# Patient Record
Sex: Female | Born: 1955 | Race: Black or African American | Hispanic: No | Marital: Married | Smoking: Never smoker
Health system: Southern US, Community
[De-identification: ages and names within clinical notes are randomized; demographics above are authoritative.]

## PROBLEM LIST (undated history)

## (undated) DIAGNOSIS — R519 Headache, unspecified: Secondary | ICD-10-CM

## (undated) DIAGNOSIS — I10 Essential (primary) hypertension: Secondary | ICD-10-CM

## (undated) DIAGNOSIS — E785 Hyperlipidemia, unspecified: Secondary | ICD-10-CM

## (undated) DIAGNOSIS — E119 Type 2 diabetes mellitus without complications: Secondary | ICD-10-CM

## (undated) DIAGNOSIS — E559 Vitamin D deficiency, unspecified: Secondary | ICD-10-CM

## (undated) DIAGNOSIS — M1990 Unspecified osteoarthritis, unspecified site: Secondary | ICD-10-CM

## (undated) DIAGNOSIS — Z8611 Personal history of tuberculosis: Secondary | ICD-10-CM

## (undated) DIAGNOSIS — T7840XA Allergy, unspecified, initial encounter: Secondary | ICD-10-CM

## (undated) DIAGNOSIS — I2510 Atherosclerotic heart disease of native coronary artery without angina pectoris: Secondary | ICD-10-CM

## (undated) HISTORY — PX: TOTAL ABDOMINAL HYSTERECTOMY: SHX209

## (undated) HISTORY — DX: Unspecified osteoarthritis, unspecified site: M19.90

## (undated) HISTORY — DX: Vitamin D deficiency, unspecified: E55.9

## (undated) HISTORY — DX: Allergy, unspecified, initial encounter: T78.40XA

## (undated) HISTORY — DX: Hyperlipidemia, unspecified: E78.5

## (undated) HISTORY — DX: Type 2 diabetes mellitus without complications: E11.9

## (undated) HISTORY — DX: Headache, unspecified: R51.9

## (undated) HISTORY — DX: Atherosclerotic heart disease of native coronary artery without angina pectoris: I25.10

## (undated) HISTORY — PX: ABDOMINAL HYSTERECTOMY: SHX81

## (undated) HISTORY — DX: Personal history of tuberculosis: Z86.11

## (undated) HISTORY — PX: POLYPECTOMY: SHX149

---

## 1997-12-14 ENCOUNTER — Emergency Department (HOSPITAL_COMMUNITY): Admission: EM | Admit: 1997-12-14 | Discharge: 1997-12-14 | Payer: Self-pay

## 1999-03-12 ENCOUNTER — Other Ambulatory Visit: Admission: RE | Admit: 1999-03-12 | Discharge: 1999-03-12 | Payer: Self-pay | Admitting: Internal Medicine

## 1999-04-02 ENCOUNTER — Ambulatory Visit (HOSPITAL_COMMUNITY): Admission: RE | Admit: 1999-04-02 | Discharge: 1999-04-02 | Payer: Self-pay | Admitting: Internal Medicine

## 1999-04-02 ENCOUNTER — Encounter: Payer: Self-pay | Admitting: Internal Medicine

## 2002-12-20 ENCOUNTER — Other Ambulatory Visit: Admission: RE | Admit: 2002-12-20 | Discharge: 2002-12-20 | Payer: Self-pay | Admitting: Obstetrics and Gynecology

## 2004-07-30 ENCOUNTER — Ambulatory Visit: Payer: Self-pay | Admitting: Gastroenterology

## 2004-08-11 ENCOUNTER — Ambulatory Visit: Payer: Self-pay | Admitting: Gastroenterology

## 2004-12-14 ENCOUNTER — Inpatient Hospital Stay (HOSPITAL_COMMUNITY): Admission: RE | Admit: 2004-12-14 | Discharge: 2004-12-16 | Payer: Self-pay | Admitting: Obstetrics & Gynecology

## 2004-12-14 ENCOUNTER — Encounter (INDEPENDENT_AMBULATORY_CARE_PROVIDER_SITE_OTHER): Payer: Self-pay | Admitting: *Deleted

## 2009-03-29 ENCOUNTER — Emergency Department (HOSPITAL_COMMUNITY): Admission: EM | Admit: 2009-03-29 | Discharge: 2009-03-29 | Payer: Self-pay | Admitting: Family Medicine

## 2009-04-03 ENCOUNTER — Ambulatory Visit (HOSPITAL_COMMUNITY): Admission: RE | Admit: 2009-04-03 | Discharge: 2009-04-03 | Payer: Self-pay | Admitting: Internal Medicine

## 2009-04-07 ENCOUNTER — Encounter: Admission: RE | Admit: 2009-04-07 | Discharge: 2009-04-07 | Payer: Self-pay | Admitting: Obstetrics and Gynecology

## 2009-04-09 ENCOUNTER — Encounter: Admission: RE | Admit: 2009-04-09 | Discharge: 2009-04-09 | Payer: Self-pay | Admitting: Obstetrics and Gynecology

## 2010-05-17 ENCOUNTER — Encounter
Admission: RE | Admit: 2010-05-17 | Discharge: 2010-05-17 | Payer: Self-pay | Source: Home / Self Care | Attending: Internal Medicine | Admitting: Internal Medicine

## 2010-09-28 DIAGNOSIS — I10 Essential (primary) hypertension: Secondary | ICD-10-CM

## 2010-10-22 NOTE — Op Note (Signed)
NAMEALVIN, RUBANO                   ACCOUNT NO.:  [id number]   MEDICAL RECORD NO.:  [id number]          PATIENT TYPE:  INP   LOCATION:  9303                          FACILITY:  [facility]   PHYSICIAN:  Ilda Mori, M.D.   DATE OF BIRTH:  1955-07-28   DATE OF PROCEDURE:  12/14/2004  DATE OF DISCHARGE:                                 OPERATIVE REPORT   PREOPERATIVE DIAGNOSIS:  Myoma uteri.   POSTOPERATIVE DIAGNOSIS:  Myoma uteri.  Plus appendix with suspected  fecalith.   PROCEDURE:  Total abdominal hysterectomy, bilateral salpingo-oophorectomy,  and appendectomy.   SURGEON:  Ilda Mori, M.D.   ASSISTANT:  Luvenia Redden, M.D.   ANESTHESIA:  General endotracheal.   ESTIMATED BLOOD LOSS:  100 mL.   SPECIMENS:  Uterus, myoma, right and left adnexa and appendix.   COMPLICATIONS:  None.   FINDINGS:  The pelvis was free of adhesions.  There was a 6 cm pedunculated  myoma coming off the uterine fundus and curving down into the cul-de-sac.  The uterus had small 1 cm pedunculated myoma as well.  The right and left  tube and ovary appeared normal status post previous tubal ligation.  The  kidneys were palpable bilaterally.  The liver edge was smooth.  On palpation  of the appendix, there were felt to be fecaliths present in the appendix.   INDICATIONS FOR PROCEDURE:  This is a 55 year old gravida 2, para 2, who was  noted to have a pelvic mass.  Evaluation revealed what appeared to be a  pedunculated myoma approximately 8 cm.  In addition, other uterine myoma  were noted on ultrasound and CT examination.  A CA125 was normal.  The  findings were discussed with the patient and the decision was made to  proceed with total abdominal hysterectomy for diagnosis and treatment of the  pelvic mass.  Because of the patient's age, a prophylactic oophorectomy will  be performed at the time of surgery.   DESCRIPTION OF PROCEDURE:  The patient was taken to the operating room and  placed in a  supine position where general endotracheal anesthesia was  induced.  The abdomen and vagina were prepped and draped in the usual  sterile fashion.  The bladder was catheterized.  A low transverse incision  was made and carried down to the fascia which was extended transversely.  The rectus sheath was then dissected from the underlying rectus muscle  inferiorly and superiorly.  The midline was then identified.  The posterior  sheath was incised vertically.  The peritoneum was then entered sharply and  extended vertically.  A self-retaining retractor, O'Connor-O'Sullivan  retractor was then placed.  The bowel was packed with lap pads.  The  pedunculated myoma was then identified, grasped, and delivered through the  incision.  The base of this was cauterized to free the myoma to aid in the  surgery.  The myoma was sent off as a separate specimen.  The cornea of the  uterus was then grasped with Kelly clamps and elevated.  The round ligament  was cauterized and divided.  The serosa over the lower uterine segment was  dissected free and the bladder was displaced inferiorly away from the lower  uterine segment and cervix.  The posterior peritoneum was then opened and  the infundibulopelvic ligaments were isolated bilaterally, clamped with  Heaney clamps, cut, and doubly ligated.  The uterine arteries were clamped,  cut, and tied.  The base of the broad ligament and cardinal ligaments were  clamped, cut, and tied.  The vagina was entered sharply and the cervix was  dissected free from the vaginal cuff and the specimen was freed and given to  the nurse to be sent to pathology.  The angles were closed with figure-of-  eight suture and the cuff was closed with a figure-of-eight suture.  Hemostasis was noted to be present.  On palpation of the appendix, it was  felt that a fecalith was present and she was at increased risk for  appendicitis.  For that reason, decision was made to remove the appendix.   The mesoappendix was clamped, cut, and ligated.  The base of the appendix  was clamped.  The appendix was then cut and removed from the table.  The  appendiceal stump was then doubly ligated.  Hemostasis was noted to be  present.  At this point, the lap pads were removed.  The self-retaining  retractor was removed.  The peritoneum was closed with a running 0 Vicryl  suture.  The fascia was closed with a running 0 Vicryl suture and the skin  was closed with a 4-0 subcuticular suture.  The patient tolerated the  procedure well and left the operating room in good condition.       RK/MEDQ  D:  12/14/2004  T:  12/14/2004  Job:  [id number]   cc:   Luvenia Redden, M.D.  [address]  [place]  [place] [address]  Fax: [phone]

## 2010-10-22 NOTE — H&P (Signed)
Penny Rice, Penny Rice                   ACCOUNT NO.:  [id number]   MEDICAL RECORD NO.:  [id number]          PATIENT TYPE:  INP   LOCATION:  NA                            [facility]  [facility]   PHYSICIAN:  Ilda Mori, M.D.   DATE OF BIRTH:  Oct 07, 1955   DATE OF ADMISSION:  12/14/2004  DATE OF DISCHARGE:                                HISTORY & PHYSICAL   CHIEF COMPLAINT:  Myoma uteri.   HISTORY OF PRESENT ILLNESS:  This is a 55 year old, gravida 2, para 2 female  who was found to have a large pelvic mass on CT scan of the abdomen.  Evaluation with ultrasound reveals that the mass appears to be a  pedunculated myoma.  The patient had a colonoscopy in March of 2006, which  was entirely negative.  The CA125 was within normal limits.  The findings  were discussed with the patient, and the decision was made to proceed with  an abdominal hysterectomy to confirm and remove what is suspected to be a  large pedunculated myoma.  In addition, the patient requests prophylactic  bilateral salpingo-oophorectomy.   PAST MEDICAL HISTORY:  Hypertension.  She is being treated for that by Dr.  Nolene Bernheim.   PREVIOUS SURGERIES:  Tubal ligation.   ALLERGIES:  No known drug allergies.   SOCIAL HISTORY:  She has been married for 23 years.  She has 2 children.  Works in Engineering geologist.  She does not smoke or use alcohol.   FAMILY HISTORY:  Positive for hypertension.  Otherwise, it appears to be  negative.   MEDICATIONS:  1.  Hydrochlorothiazide 50 mg daily.  2.  Potassium chloride 10 mEq per day.  3.  Norvasc 10 mg per day.  4.  Labetalol 200 mg twice daily.   PHYSICAL EXAMINATION:  VITAL SIGNS:  Afebrile.  Her vital signs are stable.  Blood pressure is normal at 104/78.  Her weight is 173 pounds.  HEENT:  Ears, nose, and throat are normal.  LUNGS:  Clear to auscultation and percussion.  HEART:  Without murmurs.  ABDOMEN:  Soft without hepatosplenomegaly.  EXTREMITIES:  Benign.  PELVIC:  Normal external  genitalia.  Cervix appears normal.  Her uterus is  irregular with a large palpable mass in the cul-de-sac.   ADMITTING DIAGNOSIS:  Uterine myoma.   SECONDARY DIAGNOSIS:  Well-controlled hypertension.   HOSPITAL PLAN:  Proceed with abdominal hysterectomy and bilateral salpingo-  oophorectomy.       RK/MEDQ  D:  12/13/2004  T:  12/13/2004  Job:  [id number]

## 2010-10-22 NOTE — Discharge Summary (Signed)
Penny Rice, Penny Rice                   ACCOUNT NO.:  [id number]   MEDICAL RECORD NO.:  [id number]          PATIENT TYPE:  INP   LOCATION:  9303                          [facility]   PHYSICIAN:  Ilda Mori, M.D.   DATE OF BIRTH:  Jan 25, 1956   DATE OF ADMISSION:  12/14/2004  DATE OF DISCHARGE:  12/16/2004                                 DISCHARGE SUMMARY   FINAL DIAGNOSES:  Myomata uteri.   SECONDARY DIAGNOSES:  Hypertension.   PROCEDURES:  1.  Total abdominal hysterectomy.  2.  Bilateral salpingo-oophorectomy.  3.  Appendectomy.   COMPLICATIONS:  None.   CONDITION ON DISCHARGE:  Improved.   This is a 55 year old gravida 2, para 2 female who was found to have a large  pelvic mass on CT scan of the abdomen.  Evaluation reveals this most likely  represented a pedunculated myoma and decision was made to proceed with  surgical exploration and removal of the uterus and ovaries.  The patient was  brought to the operating room the day of admission where a total abdominal  hysterectomy, bilateral salpingo-oophorectomy, and appendectomy was  performed.  The patient's postoperative course was benign without  significant fever or anemia.  On the second postoperative day the patient  was ambulating well.  Her pain was controlled with oral analgesia.  She was  passing flatus and voiding without difficulty.  She is therefore felt to be  ready for discharge.  She was discharged on a regular diet, told to limit  her activities.  She was given Tylox 20 tablets to take one to two every  four hours for pain.  She was also encouraged to try over-the-counter pain  medications prior to using the Tylox.  She will resume her preoperative  antihypertensive medication which included hydrochlorothiazide, Norvasc, and  labetalol.  In addition, during her hospitalization a mild hyperglycemia was  noted with a fasting blood glucose of 122 and a random blood glucose of 134.  The patient was informed of  these values and asked to follow up with her  internist.  She will return to our office in four weeks for follow-up  evaluation.   LABORATORY DATA:  As I mentioned, fasting glucose was 122.  Her other  laboratory data showed that preoperatively her random glucose of 134.  Her  renal profile revealed a slightly low chloride of 93 mEq/L.  Her liver  function tests were normal.  Urinalysis was negative.  Her hemoglobin  preoperatively was 12.6 with a white count of  8700.  Postoperatively her hemoglobin was 11.4 with a white count of 12,000.  Pathology revealed appendix with fecal material within the lumen.  Her  uterus showed myoma.  Total weight was around 250 g.  Her ovaries were  without pathology.       RK/MEDQ  D:  12/16/2004  T:  12/16/2004  Job:  [id number]

## 2012-08-03 ENCOUNTER — Encounter: Payer: Self-pay | Admitting: Gastroenterology

## 2012-10-19 ENCOUNTER — Other Ambulatory Visit: Payer: Self-pay | Admitting: Internal Medicine

## 2012-10-19 DIAGNOSIS — Z1231 Encounter for screening mammogram for malignant neoplasm of breast: Secondary | ICD-10-CM

## 2012-10-24 ENCOUNTER — Ambulatory Visit (HOSPITAL_COMMUNITY)
Admission: RE | Admit: 2012-10-24 | Discharge: 2012-10-24 | Disposition: A | Discharge status: Home / Self Care | Payer: BC Managed Care – PPO | Source: Ambulatory Visit | Attending: Internal Medicine | Admitting: Internal Medicine

## 2012-10-24 DIAGNOSIS — Z1231 Encounter for screening mammogram for malignant neoplasm of breast: Secondary | ICD-10-CM

## 2013-08-25 ENCOUNTER — Encounter (HOSPITAL_COMMUNITY): Payer: Self-pay | Admitting: Emergency Medicine

## 2013-08-25 ENCOUNTER — Emergency Department (HOSPITAL_COMMUNITY)
Admission: EM | Admit: 2013-08-25 | Discharge: 2013-08-25 | Disposition: A | Discharge status: Home / Self Care | Payer: BC Managed Care – PPO | Attending: Emergency Medicine | Admitting: Emergency Medicine

## 2013-08-25 ENCOUNTER — Emergency Department (HOSPITAL_COMMUNITY): Payer: BC Managed Care – PPO

## 2013-08-25 DIAGNOSIS — I10 Essential (primary) hypertension: Secondary | ICD-10-CM | POA: Insufficient documentation

## 2013-08-25 DIAGNOSIS — R630 Anorexia: Secondary | ICD-10-CM | POA: Insufficient documentation

## 2013-08-25 DIAGNOSIS — J40 Bronchitis, not specified as acute or chronic: Secondary | ICD-10-CM | POA: Insufficient documentation

## 2013-08-25 DIAGNOSIS — B9789 Other viral agents as the cause of diseases classified elsewhere: Secondary | ICD-10-CM | POA: Insufficient documentation

## 2013-08-25 DIAGNOSIS — R42 Dizziness and giddiness: Secondary | ICD-10-CM | POA: Insufficient documentation

## 2013-08-25 DIAGNOSIS — Z79899 Other long term (current) drug therapy: Secondary | ICD-10-CM | POA: Insufficient documentation

## 2013-08-25 DIAGNOSIS — J3489 Other specified disorders of nose and nasal sinuses: Secondary | ICD-10-CM | POA: Insufficient documentation

## 2013-08-25 DIAGNOSIS — R6883 Chills (without fever): Secondary | ICD-10-CM | POA: Insufficient documentation

## 2013-08-25 DIAGNOSIS — B349 Viral infection, unspecified: Secondary | ICD-10-CM

## 2013-08-25 HISTORY — DX: Essential (primary) hypertension: I10

## 2013-08-25 MED ORDER — LEVOFLOXACIN 750 MG PO TABS: 750.0000 mg | ORAL_TABLET | Freq: Every day | ORAL | Status: DC

## 2013-08-25 MED ORDER — AEROCHAMBER Z-STAT PLUS/MEDIUM MISC
1.0000 | Freq: Once | Status: AC
  Administered 2013-08-25: 1

## 2013-08-25 MED ORDER — ALBUTEROL SULFATE HFA 108 (90 BASE) MCG/ACT IN AERS
2.0000 | INHALATION_SPRAY | Freq: Once | RESPIRATORY_TRACT | Status: AC
  Administered 2013-08-25: 2 via RESPIRATORY_TRACT
  Filled 2013-08-25: qty 6.7

## 2013-08-25 MED ORDER — PREDNISONE 20 MG PO TABS: ORAL_TABLET | ORAL | Status: DC

## 2013-08-25 NOTE — ED Provider Notes (Signed)
CSN: [id number]     Arrival date & time 08/25/13  1034 History  This chart was scribed for non-physician practitioner, Jamse Mead, PA-C,working with Alfonzo Feller, DO, by Marlowe Kays, ED Scribe.  This patient was seen in room WTR5/WTR5 and the patient's care was started at 12:15 PM.  Chief Complaint  Patient presents with  . Cough  . Wheezing  . Back Pain    pain due to coughing   The history is provided by the patient. No language interpreter was used.   HPI Comments:  Penny Rice is a 58 y.o. female who presents to the Emergency Department complaining of nasal congestion and productive cough of white mucus onset three weeks. She reports associated chills, dizziness, and decreased appetite. Pt states she presented to her PCP approximately two weeks ago and received a prescription for an antibiotic but cannot recall which one. She presented to her PCP again one week ago and received a prescription for [facility], but states it made her cough more. She denies any sick contacts. She denies fever, hemoptysis, diaphoresis, CP, nausea, vomiting, hematochezia, urinary issues, sinus pressure, sore throat, neck pain, difficulty swallowing, blurred or loss of vision. She states she is not a smoker.   Past Medical History  Diagnosis Date  . Hypertension    Past Surgical History  Procedure Laterality Date  . Abdominal hysterectomy     Family History  Problem Relation Age of Onset  . Hypertension Sister    History  Substance Use Topics  . Smoking status: Never Smoker   . Smokeless tobacco: Not on file  . Alcohol Use: No   OB History   Grav Para Term Preterm Abortions TAB SAB Ect Mult Living                 Review of Systems  Constitutional: Positive for appetite change. Negative for fever.  HENT: Positive for congestion. Negative for sore throat and trouble swallowing.   Respiratory: Positive for cough.   Cardiovascular: Negative for chest pain.  Gastrointestinal:  Negative for nausea and vomiting.  Musculoskeletal: Negative for neck pain.  All other systems reviewed and are negative.   Allergies  Review of patient's allergies indicates no known allergies.  Home Medications   Current Outpatient Rx  Name  Route  Sig  Dispense  Refill  . amLODipine (NORVASC) 10 MG tablet   Oral   Take 10 mg by mouth daily.           Marland Kitchen labetalol (NORMODYNE) 200 MG tablet   Oral   Take 200 mg by mouth 2 (two) times daily.           Marland Kitchen levofloxacin (LEVAQUIN) 750 MG tablet   Oral   Take 1 tablet (750 mg total) by mouth daily. X 7 days   5 tablet   0   . predniSONE (DELTASONE) 20 MG tablet      3 tabs po day one, then 2 tabs daily x 4 days   11 tablet   0    Triage Vitals: BP 117/84  Pulse 87  Temp(Src) 98 F (36.7 C) (Oral)  Wt 190 lb (86.183 kg)  SpO2 97% Physical Exam  Nursing note and vitals reviewed. Constitutional: She is oriented to person, place, and time. She appears well-developed and well-nourished.  HENT:  Head: Normocephalic and atraumatic.  Mouth/Throat: Oropharynx is clear and moist. No oropharyngeal exudate.  Eyes: Conjunctivae and EOM are normal. Pupils are equal, round, and reactive to light.  Right eye exhibits no discharge. Left eye exhibits no discharge.  Neck: Normal range of motion. Neck supple. No tracheal deviation present.  Cardiovascular: Normal rate, regular rhythm and normal heart sounds.  Exam reveals no friction rub.   No murmur heard. Pulses:      Radial pulses are 2+ on the right side, and 2+ on the left side.       Dorsalis pedis pulses are 2+ on the right side, and 2+ on the left side.  Pulmonary/Chest: Effort normal and breath sounds normal. No respiratory distress. She has no wheezes. She has no rales. She exhibits no tenderness.  Patient is able to speak in full sentences without difficulty Negative use of accessory muscles Negative stridor  Musculoskeletal: Normal range of motion.  Full ROM to upper and  lower extremities without difficulty noted, negative ataxia noted.  Lymphadenopathy:    She has no cervical adenopathy.  Neurological: She is alert and oriented to person, place, and time. No cranial nerve deficit. She exhibits normal muscle tone. Coordination normal.  Cranial nerves III-XII grossly intact Strength 5+/5+ to upper and lower extremities bilaterally with resistance applied, equal distribution noted Equal grip strength   Skin: Skin is warm and dry.  Psychiatric: She has a normal mood and affect. Her behavior is normal.    ED Course  Procedures (including critical care time) DIAGNOSTIC STUDIES: Oxygen Saturation is 97% on RA, normal by my interpretation.   COORDINATION OF CARE: 12:20 PM- Will order a CXR. Pt verbalizes understanding and agrees to plan.  Medications  albuterol (PROVENTIL HFA;VENTOLIN HFA) 108 (90 BASE) MCG/ACT inhaler 2 puff (not administered)    Labs Review Labs Reviewed - No data to display Imaging Review Dg Chest 2 View  08/25/2013   CLINICAL DATA:  Wheezing, cough, history hypertension  EXAM: CHEST  2 VIEW  COMPARISON:  None  FINDINGS: Normal heart size, mediastinal contours, and pulmonary vascularity.  Mild peribronchial thickening.  Area of minimal opacity in the right upper lobe could represent subtle infiltrate but followup until resolution recommended to exclude underlying abnormality/nodule.  Remaining lungs clear.  No pleural effusion or pneumothorax.  Bones appear demineralized.  IMPRESSION: Bronchitic changes with minimal right upper lobe opacity, could represent subtle infiltrate though followup radiographs until resolution recommended to exclude underlying pulmonary nodule.   Electronically Signed   By: Lavonia Dana M.D.   On: 08/25/2013 12:25     EKG Interpretation None      MDM   Final diagnoses:  Bronchitis  Viral syndrome    Filed Vitals:   08/25/13 1133 08/25/13 1240  BP: 117/84   Pulse: 87   Temp: 98 F (36.7 C)    TempSrc: Oral   Weight: 190 lb (86.183 kg)   SpO2: 97% 95%    I personally performed the services described in this documentation, which was scribed in my presence. The recorded information has been reviewed and is accurate.  Patient presenting to the ED with cough, nasal congestion, chest congestion that has been ongoing for the past 2 weeks. Patient was seen and assessed by her primary care provider on Wednesday to prescribe the patient Tessalon Perles-reported that this is not helping. Stated that she's been staying hydrated with minimal relief. Stated that she's been having abdominal soreness and lower back discomfort secondary to coughing so much. Denied hemoptysis, fever, chills, chest pain, difficulty breathing, nausea, vomiting. Alert and oriented. GCS 15. Heart rate and rhythm normal. Lungs clear to auscultation to upper and lower lobes  bilaterally. Radial and DP pulses 2+ bilaterally. Full range of motion to upper and lower extremities bilaterally without difficulty or ataxia noted. Patient is able to speak in full sentences without difficulty. Negative use of accessory muscles. Negative signs of respiratory distress. Chest x-ray noted bronchitis findings with minimal right upper lobe opacity that could represent subtle infiltrate though followup radiographs until resolution are recommended to exclude possible pulmonary nodule. Pulse ox with ambulation has been checked with pulse ox at 95% on room air. Suspicion to be bronchitis-viral infection. Cannot rule out possible beginnings of pneumonia. Cannot rule out possible beginnings of pulmonary nodule. Patient stable, afebrile. Patient not hypoxic-95% with ambulation on room air and 97% on room air while resting. Patient does not appear septic. Discharged patient. Discharge patient with antibiotics, burst of prednisone, albuterol inhaler. Discussed with patient to rest and stay hydrated. Discussed with patient to avoid any physical or shortness  activity. Discussed with patient to stay hydrated. Discussed with patient to closely monitor symptoms and if symptoms are to worsen or change to report back to the ED - strict return instructions given.  Patient agreed to plan of care, understood, all questions answered.   Jamse Mead, PA-C 08/25/13 2132

## 2013-08-25 NOTE — ED Notes (Addendum)
Pt reports 3 week hx of chest congestion and cough. "Cough caused back and stomach pain ". PCP prescribed Tessalon Perls. Pt feels the this medicine made her cough more. Denies fever over the last few day, denies chest pain ort shortness of breath. Pt c/o sweating

## 2013-08-25 NOTE — Discharge Instructions (Signed)
Please call your doctor for a followup appointment within 24-48 hours. When you talk to your doctor please let them know that you were seen in the emergency department and have them acquire all of your records so that they can discuss the findings with you and formulate a treatment plan to fully care for your new and ongoing problems. Please call and set up an appointment with your primary care provider to be reassessed within the next 24-48 hours Will need a repeat chest x-ray after antibiotics have been completed Please rest and stay hydrated Please take antibiotics as prescribed Please use albuterol inhaler as needed for shortness of breath Please drink plenty of fluids Please continue to monitor symptoms closely and if symptoms are to worsen or change (fever greater than 101, chills, neck pain, neck stiffness, worsening changes to cough, blood in her sputum of your cough, nausea, vomiting, worsening or changes to abdominal pain, dizziness, inability to walk, chest pain, shortness of breath, difficulty breathing) please report back to the ED immediately  Bronchitis Bronchitis is inflammation of the airways that extend from the windpipe into the lungs (bronchi). The inflammation often causes mucus to develop, which leads to a cough. If the inflammation becomes severe, it may cause shortness of breath. CAUSES  Bronchitis may be caused by:   Viral infections.   Bacteria.   Cigarette smoke.   Allergens, pollutants, and other irritants.  SIGNS AND SYMPTOMS  The most common symptom of bronchitis is a frequent cough that produces mucus. Other symptoms include:  Fever.   Body aches.   Chest congestion.   Chills.   Shortness of breath.   Sore throat.  DIAGNOSIS  Bronchitis is usually diagnosed through a medical history and physical exam. Tests, such as chest X-rays, are sometimes done to rule out other conditions.  TREATMENT  You may need to avoid contact with whatever  caused the problem (smoking, for example). Medicines are sometimes needed. These may include:  Antibiotics. These may be prescribed if the condition is caused by bacteria.  Cough suppressants. These may be prescribed for relief of cough symptoms.   Inhaled medicines. These may be prescribed to help open your airways and make it easier for you to breathe.   Steroid medicines. These may be prescribed for those with recurrent (chronic) bronchitis. HOME CARE INSTRUCTIONS  Get plenty of rest.   Drink enough fluids to keep your urine clear or pale yellow (unless you have a medical condition that requires fluid restriction). Increasing fluids may help thin your secretions and will prevent dehydration.   Only take over-the-counter or prescription medicines as directed by your health care provider.  Only take antibiotics as directed. Make sure you finish them even if you start to feel better.  Avoid secondhand smoke, irritating chemicals, and strong fumes. These will make bronchitis worse. If you are a smoker, quit smoking. Consider using nicotine gum or skin patches to help control withdrawal symptoms. Quitting smoking will help your lungs heal faster.   Put a cool-mist humidifier in your bedroom at night to moisten the air. This may help loosen mucus. Change the water in the humidifier daily. You can also run the hot water in your shower and sit in the bathroom with the door closed for 5 10 minutes.   Follow up with your health care provider as directed.   Wash your hands frequently to avoid catching bronchitis again or spreading an infection to others.  SEEK MEDICAL CARE IF: Your symptoms do not improve  after 1 week of treatment.  SEEK IMMEDIATE MEDICAL CARE IF:  Your fever increases.  You have chills.   You have chest pain.   You have worsening shortness of breath.   You have bloody sputum.  You faint.  You have lightheadedness.  You have a severe headache.    You vomit repeatedly. MAKE SURE YOU:   Understand these instructions.  Will watch your condition.  Will get help right away if you are not doing well or get worse. Document Released: 05/23/2005 Document Revised: 03/13/2013 Document Reviewed: 01/15/2013 [facility] Patient Information 2014 [place].

## 2013-08-26 NOTE — ED Provider Notes (Signed)
Medical screening examination/treatment/procedure(s) were performed by non-physician practitioner and as supervising physician I was immediately available for consultation/collaboration.   EKG Interpretation None        Alfonzo Feller, DO 08/26/13 2033

## 2013-10-22 ENCOUNTER — Other Ambulatory Visit: Payer: Self-pay | Admitting: Internal Medicine

## 2013-10-22 DIAGNOSIS — Z1231 Encounter for screening mammogram for malignant neoplasm of breast: Secondary | ICD-10-CM

## 2013-10-25 ENCOUNTER — Ambulatory Visit (HOSPITAL_COMMUNITY)
Admission: RE | Admit: 2013-10-25 | Discharge: 2013-10-25 | Disposition: A | Discharge status: Home / Self Care | Payer: BC Managed Care – PPO | Source: Ambulatory Visit | Attending: Internal Medicine | Admitting: Internal Medicine

## 2013-10-25 DIAGNOSIS — Z1231 Encounter for screening mammogram for malignant neoplasm of breast: Secondary | ICD-10-CM

## 2014-06-06 HISTORY — PX: COLONOSCOPY: SHX174

## 2014-08-06 ENCOUNTER — Encounter: Payer: Self-pay | Admitting: Gastroenterology

## 2014-08-21 ENCOUNTER — Encounter: Payer: Self-pay | Admitting: Gastroenterology

## 2014-09-25 ENCOUNTER — Ambulatory Visit (AMBULATORY_SURGERY_CENTER): Payer: Self-pay | Admitting: *Deleted

## 2014-09-25 VITALS — Ht 64.0 in | Wt 186.0 lb

## 2014-09-25 DIAGNOSIS — Z86010 Personal history of colonic polyps: Secondary | ICD-10-CM

## 2014-09-25 MED ORDER — NA SULFATE-K SULFATE-MG SULF 17.5-3.13-1.6 GM/177ML PO SOLN: ORAL | Status: DC

## 2014-09-25 NOTE — Progress Notes (Signed)
No allergies to eggs or soy. No problems with anesthesia.  Pt given Emmi instructions for colonoscopy  No oxygen use  No diet drug use  

## 2014-10-03 ENCOUNTER — Encounter: Payer: Self-pay | Admitting: Gastroenterology

## 2014-10-09 ENCOUNTER — Encounter: Payer: Self-pay | Admitting: Gastroenterology

## 2014-10-09 ENCOUNTER — Other Ambulatory Visit: Payer: Self-pay | Admitting: Gastroenterology

## 2014-10-09 ENCOUNTER — Ambulatory Visit (AMBULATORY_SURGERY_CENTER): Payer: BLUE CROSS/BLUE SHIELD | Admitting: Gastroenterology

## 2014-10-09 VITALS — BP 149/92 | HR 62 | Temp 96.4°F | Resp 18 | Ht 64.0 in | Wt 186.0 lb

## 2014-10-09 DIAGNOSIS — D125 Benign neoplasm of sigmoid colon: Secondary | ICD-10-CM

## 2014-10-09 DIAGNOSIS — D123 Benign neoplasm of transverse colon: Secondary | ICD-10-CM

## 2014-10-09 DIAGNOSIS — Z1211 Encounter for screening for malignant neoplasm of colon: Secondary | ICD-10-CM | POA: Diagnosis not present

## 2014-10-09 MED ORDER — SODIUM CHLORIDE 0.9 % IV SOLN: 500.0000 mL | INTRAVENOUS | Status: DC

## 2014-10-09 NOTE — Progress Notes (Signed)
Called to room to assist during endoscopic procedure.  Patient ID and intended procedure confirmed with present staff. Received instructions for my participation in the procedure from the performing physician.  

## 2014-10-09 NOTE — Op Note (Signed)
[place]  [address]. [place], [address]   COLONOSCOPY PROCEDURE REPORT  PATIENT: Penny Rice, Penny Rice  MR#: [id number] BIRTHDATE: [birth year] , 4  yrs. old GENDER: female ENDOSCOPIST: Ladene Artist, MD, [facility] REFERRED [id number] Carlis Abbott, M.D. PROCEDURE DATE:  10/09/2014 PROCEDURE:   Colonoscopy, screening and Colonoscopy with snare polypectomy First Screening Colonoscopy - Avg.  risk and is 50 yrs.  old or older - No.  Prior Negative Screening - Now for repeat screening. N/A  History of Adenoma - Now for follow-up colonoscopy & has been > or = to 3 yrs.  N/A  Polyps Removed Today ASA CLASS:   Class II INDICATIONS:Screening for colonic neoplasia and Colorectal Neoplasm Risk Assessment for this procedure is average risk. MEDICATIONS: Monitored anesthesia care, Propofol 300 mg IV, and lidocaine 150 mg IV DESCRIPTION OF PROCEDURE:   After the risks benefits and alternatives of the procedure were thoroughly explained, informed consent was obtained.  The digital rectal exam revealed no abnormalities of the rectum.   The LB [id number]  endoscope was introduced through the anus and advanced to the cecum, which was identified by both the appendix and ileocecal valve. No adverse events experienced.   The quality of the prep was excellent. (Suprep was used)  The instrument was then slowly withdrawn as the colon was fully examined.    COLON FINDINGS: A sessile polyp measuring 7 mm in size was found in the transverse colon.  A polypectomy was performed with a cold snare.  The resection was complete, the polyp tissue was completely retrieved and sent to histology.   Two sessile polyps measuring 5 mm in size were found in the sigmoid colon.  Polypectomies were performed with a cold snare.  The resection was complete, the polyp tissue was completely retrieved and sent to histology.   The examination was otherwise normal.  Retroflexed views revealed internal Grade I  hemorrhoids. The time to cecum = 2.6 Withdrawal time = 12.4   The scope was withdrawn and the procedure completed. COMPLICATIONS: There were no immediate complications.  ENDOSCOPIC IMPRESSION: 1.   Sessile polyp in the transverse colon; polypectomy performed with a cold snare 2.   Two sessile polyps in the sigmoid colon; polypectomies performed with a cold snare 3.   Grade l internal hemorrhoids  RECOMMENDATIONS: 1.  Await pathology results 2.  Repeat colonoscopy in 5 years if polyp(s) adenomatous; otherwise 10 years  eSigned:  Ladene Artist, MD, [facility] 10/09/2014 8:27 AM

## 2014-10-09 NOTE — Progress Notes (Signed)
Report to PACU, RN, vss, BBS= Clear.  

## 2014-10-09 NOTE — Patient Instructions (Signed)
YOU HAD AN ENDOSCOPIC PROCEDURE TODAY AT [facility]:   Refer to the procedure report that was given to you for any specific questions about what was found during the examination.  If the procedure report does not answer your questions, please call your gastroenterologist to clarify.  If you requested that your care partner not be given the details of your procedure findings, then the procedure report has been included in a sealed envelope for you to review at your convenience later.  YOU SHOULD EXPECT: Some feelings of bloating in the abdomen. Passage of more gas than usual.  Walking can help get rid of the air that was put into your GI tract during the procedure and reduce the bloating. If you had a lower endoscopy (such as a colonoscopy or flexible sigmoidoscopy) you may notice spotting of blood in your stool or on the toilet paper. If you underwent a bowel prep for your procedure, you may not have a normal bowel movement for a few days.  Please Note:  You might notice some irritation and congestion in your nose or some drainage.  This is from the oxygen used during your procedure.  There is no need for concern and it should clear up in a day or so.  SYMPTOMS TO REPORT IMMEDIATELY:   Following lower endoscopy (colonoscopy or flexible sigmoidoscopy):  Excessive amounts of blood in the stool  Significant tenderness or worsening of abdominal pains  Swelling of the abdomen that is new, acute  Fever of 100F or higher  For urgent or emergent issues, a gastroenterologist can be reached at any hour by calling [phone].   DIET: Your first meal following the procedure should be a small meal and then it is ok to progress to your normal diet. Heavy or fried foods are harder to digest and may make you feel nauseous or bloated.  Likewise, meals heavy in dairy and vegetables can increase bloating.  Drink plenty of fluids but you should avoid alcoholic beverages for 24  hours.  ACTIVITY:  You should plan to take it easy for the rest of today and you should NOT DRIVE or use heavy machinery until tomorrow (because of the sedation medicines used during the test).    FOLLOW UP: Our staff will call the number listed on your records the next business day following your procedure to check on you and address any questions or concerns that you may have regarding the information given to you following your procedure. If we do not reach you, we will leave a message.  However, if you are feeling well and you are not experiencing any problems, there is no need to return our call.  We will assume that you have returned to your regular daily activities without incident.  If any biopsies were taken you will be contacted by phone or by letter within the next 1-3 weeks.  Please call us at [phone] if you have not heard about the biopsies in 3 weeks.   SIGNATURES/CONFIDENTIALITY: You and/or your care partner have signed paperwork which will be entered into your electronic medical record.  These signatures attest to the fact that that the information above on your After Visit Summary has been reviewed and is understood.  Full responsibility of the confidentiality of this discharge information lies with you and/or your care-partner.  Continue your normal medications  Please read over handouts about polyps, hemorrhoids and high fiber diets

## 2014-10-10 ENCOUNTER — Telehealth: Payer: Self-pay | Admitting: *Deleted

## 2014-10-10 NOTE — Telephone Encounter (Signed)
  Follow up Call-  Call back number 10/09/2014  Post procedure Call Back phone  # [phone]  Permission to leave phone message Yes     Patient questions:  Do you have a fever, pain , or abdominal swelling? No. Pain Score  0 *  Have you tolerated food without any problems? Yes.    Have you been able to return to your normal activities? Yes.    Do you have any questions about your discharge instructions: Diet   No. Medications  No. Follow up visit  No.  Do you have questions or concerns about your Care? No.  Actions: * If pain score is 4 or above: No action needed, pain <4.

## 2014-10-16 ENCOUNTER — Encounter: Payer: Self-pay | Admitting: Gastroenterology

## 2014-12-15 ENCOUNTER — Other Ambulatory Visit: Payer: Self-pay | Admitting: Internal Medicine

## 2014-12-15 DIAGNOSIS — Z1231 Encounter for screening mammogram for malignant neoplasm of breast: Secondary | ICD-10-CM

## 2014-12-24 ENCOUNTER — Ambulatory Visit (HOSPITAL_COMMUNITY)
Admission: RE | Admit: 2014-12-24 | Discharge: 2014-12-24 | Disposition: A | Discharge status: Home / Self Care | Payer: BLUE CROSS/BLUE SHIELD | Source: Ambulatory Visit | Attending: Internal Medicine | Admitting: Internal Medicine

## 2014-12-24 DIAGNOSIS — Z1231 Encounter for screening mammogram for malignant neoplasm of breast: Secondary | ICD-10-CM | POA: Diagnosis present

## 2016-01-10 ENCOUNTER — Encounter (HOSPITAL_COMMUNITY): Payer: Self-pay | Admitting: *Deleted

## 2016-01-10 ENCOUNTER — Emergency Department (HOSPITAL_COMMUNITY)
Admission: EM | Admit: 2016-01-10 | Discharge: 2016-01-10 | Disposition: A | Discharge status: Home / Self Care | Payer: BLUE CROSS/BLUE SHIELD | Attending: Emergency Medicine | Admitting: Emergency Medicine

## 2016-01-10 ENCOUNTER — Emergency Department (HOSPITAL_COMMUNITY): Payer: BLUE CROSS/BLUE SHIELD

## 2016-01-10 DIAGNOSIS — Y999 Unspecified external cause status: Secondary | ICD-10-CM | POA: Insufficient documentation

## 2016-01-10 DIAGNOSIS — Y92410 Unspecified street and highway as the place of occurrence of the external cause: Secondary | ICD-10-CM | POA: Diagnosis not present

## 2016-01-10 DIAGNOSIS — Z79899 Other long term (current) drug therapy: Secondary | ICD-10-CM | POA: Diagnosis not present

## 2016-01-10 DIAGNOSIS — I10 Essential (primary) hypertension: Secondary | ICD-10-CM | POA: Insufficient documentation

## 2016-01-10 DIAGNOSIS — R0789 Other chest pain: Secondary | ICD-10-CM | POA: Diagnosis not present

## 2016-01-10 DIAGNOSIS — S46912A Strain of unspecified muscle, fascia and tendon at shoulder and upper arm level, left arm, initial encounter: Secondary | ICD-10-CM | POA: Diagnosis not present

## 2016-01-10 DIAGNOSIS — S299XXA Unspecified injury of thorax, initial encounter: Secondary | ICD-10-CM | POA: Diagnosis not present

## 2016-01-10 DIAGNOSIS — S29012A Strain of muscle and tendon of back wall of thorax, initial encounter: Secondary | ICD-10-CM | POA: Diagnosis not present

## 2016-01-10 DIAGNOSIS — Y939 Activity, unspecified: Secondary | ICD-10-CM | POA: Insufficient documentation

## 2016-01-10 DIAGNOSIS — M546 Pain in thoracic spine: Secondary | ICD-10-CM | POA: Diagnosis not present

## 2016-01-10 DIAGNOSIS — S279XXA Injury of unspecified intrathoracic organ, initial encounter: Secondary | ICD-10-CM | POA: Diagnosis not present

## 2016-01-10 DIAGNOSIS — M791 Myalgia: Secondary | ICD-10-CM | POA: Diagnosis not present

## 2016-01-10 NOTE — Discharge Instructions (Signed)
Take Tylenol as directed for pain. See your doctor  if significant pain in the next 3 or 4 days. Get your blood pressure recheck within the next 3 weeks. Today's was mildly elevated at 149/94. Return if you feel worse for any reason
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
## 2016-01-11 ENCOUNTER — Other Ambulatory Visit: Payer: Self-pay | Admitting: Internal Medicine

## 2016-01-11 DIAGNOSIS — Z1231 Encounter for screening mammogram for malignant neoplasm of breast: Secondary | ICD-10-CM

## 2016-01-18 ENCOUNTER — Ambulatory Visit
Admission: RE | Admit: 2016-01-18 | Discharge: 2016-01-18 | Disposition: A | Discharge status: Home / Self Care | Payer: BLUE CROSS/BLUE SHIELD | Source: Ambulatory Visit | Attending: Internal Medicine | Admitting: Internal Medicine

## 2016-01-18 DIAGNOSIS — Z1231 Encounter for screening mammogram for malignant neoplasm of breast: Secondary | ICD-10-CM | POA: Diagnosis not present

## 2016-01-19 LAB — HM MAMMOGRAPHY

## 2016-02-03 DIAGNOSIS — J40 Bronchitis, not specified as acute or chronic: Secondary | ICD-10-CM | POA: Diagnosis not present

## 2016-02-03 DIAGNOSIS — E039 Hypothyroidism, unspecified: Secondary | ICD-10-CM | POA: Diagnosis not present

## 2016-02-03 DIAGNOSIS — I10 Essential (primary) hypertension: Secondary | ICD-10-CM | POA: Diagnosis not present

## 2016-02-03 LAB — HEMOGLOBIN A1C: Hemoglobin A1C: 6.6

## 2016-02-15 ENCOUNTER — Other Ambulatory Visit: Payer: Self-pay | Admitting: Internal Medicine

## 2016-02-15 DIAGNOSIS — R42 Dizziness and giddiness: Secondary | ICD-10-CM

## 2016-02-15 DIAGNOSIS — J449 Chronic obstructive pulmonary disease, unspecified: Secondary | ICD-10-CM | POA: Diagnosis not present

## 2016-02-15 DIAGNOSIS — R51 Headache: Secondary | ICD-10-CM

## 2016-02-15 DIAGNOSIS — E039 Hypothyroidism, unspecified: Secondary | ICD-10-CM | POA: Diagnosis not present

## 2016-02-15 DIAGNOSIS — I10 Essential (primary) hypertension: Secondary | ICD-10-CM | POA: Diagnosis not present

## 2016-02-15 DIAGNOSIS — R519 Headache, unspecified: Secondary | ICD-10-CM

## 2016-02-18 ENCOUNTER — Ambulatory Visit (HOSPITAL_COMMUNITY): Payer: BLUE CROSS/BLUE SHIELD

## 2016-02-18 ENCOUNTER — Ambulatory Visit (HOSPITAL_COMMUNITY)
Admission: RE | Admit: 2016-02-18 | Discharge: 2016-02-18 | Disposition: A | Discharge status: Home / Self Care | Payer: BLUE CROSS/BLUE SHIELD | Source: Ambulatory Visit | Attending: Internal Medicine | Admitting: Internal Medicine

## 2016-02-18 DIAGNOSIS — I6782 Cerebral ischemia: Secondary | ICD-10-CM | POA: Diagnosis not present

## 2016-02-18 DIAGNOSIS — R51 Headache: Secondary | ICD-10-CM | POA: Insufficient documentation

## 2016-02-18 DIAGNOSIS — R519 Headache, unspecified: Secondary | ICD-10-CM

## 2016-02-18 DIAGNOSIS — R42 Dizziness and giddiness: Secondary | ICD-10-CM | POA: Diagnosis not present

## 2016-02-18 MED ORDER — IOPAMIDOL (ISOVUE-300) INJECTION 61%
80.0000 mL | Freq: Once | INTRAVENOUS | Status: AC | PRN
  Administered 2016-02-18: 80 mL via INTRAVENOUS

## 2016-03-01 DIAGNOSIS — E039 Hypothyroidism, unspecified: Secondary | ICD-10-CM | POA: Diagnosis not present

## 2016-03-01 DIAGNOSIS — J449 Chronic obstructive pulmonary disease, unspecified: Secondary | ICD-10-CM | POA: Diagnosis not present

## 2016-03-01 DIAGNOSIS — I10 Essential (primary) hypertension: Secondary | ICD-10-CM | POA: Diagnosis not present

## 2016-04-18 DIAGNOSIS — I10 Essential (primary) hypertension: Secondary | ICD-10-CM | POA: Diagnosis not present

## 2016-04-18 DIAGNOSIS — J449 Chronic obstructive pulmonary disease, unspecified: Secondary | ICD-10-CM | POA: Diagnosis not present

## 2016-04-18 DIAGNOSIS — E039 Hypothyroidism, unspecified: Secondary | ICD-10-CM | POA: Diagnosis not present

## 2016-04-18 DIAGNOSIS — R6889 Other general symptoms and signs: Secondary | ICD-10-CM | POA: Diagnosis not present

## 2016-04-18 DIAGNOSIS — E7800 Pure hypercholesterolemia, unspecified: Secondary | ICD-10-CM | POA: Diagnosis not present

## 2016-08-11 DIAGNOSIS — I10 Essential (primary) hypertension: Secondary | ICD-10-CM | POA: Diagnosis not present

## 2016-08-11 DIAGNOSIS — E612 Magnesium deficiency: Secondary | ICD-10-CM | POA: Diagnosis not present

## 2016-08-11 DIAGNOSIS — E559 Vitamin D deficiency, unspecified: Secondary | ICD-10-CM | POA: Diagnosis not present

## 2016-08-11 DIAGNOSIS — E781 Pure hyperglyceridemia: Secondary | ICD-10-CM | POA: Diagnosis not present

## 2016-08-11 DIAGNOSIS — E039 Hypothyroidism, unspecified: Secondary | ICD-10-CM | POA: Diagnosis not present

## 2016-08-11 DIAGNOSIS — E7800 Pure hypercholesterolemia, unspecified: Secondary | ICD-10-CM | POA: Diagnosis not present

## 2016-08-11 DIAGNOSIS — J449 Chronic obstructive pulmonary disease, unspecified: Secondary | ICD-10-CM | POA: Diagnosis not present

## 2016-12-15 DIAGNOSIS — I10 Essential (primary) hypertension: Secondary | ICD-10-CM | POA: Diagnosis not present

## 2016-12-15 DIAGNOSIS — E7800 Pure hypercholesterolemia, unspecified: Secondary | ICD-10-CM | POA: Diagnosis not present

## 2016-12-15 DIAGNOSIS — E039 Hypothyroidism, unspecified: Secondary | ICD-10-CM | POA: Diagnosis not present

## 2016-12-15 DIAGNOSIS — J449 Chronic obstructive pulmonary disease, unspecified: Secondary | ICD-10-CM | POA: Diagnosis not present

## 2017-01-27 ENCOUNTER — Emergency Department (HOSPITAL_COMMUNITY)
Admission: EM | Admit: 2017-01-27 | Discharge: 2017-01-27 | Disposition: A | Discharge status: Home / Self Care | Payer: BLUE CROSS/BLUE SHIELD | Attending: Emergency Medicine | Admitting: Emergency Medicine

## 2017-01-27 DIAGNOSIS — Z79899 Other long term (current) drug therapy: Secondary | ICD-10-CM | POA: Insufficient documentation

## 2017-01-27 DIAGNOSIS — K089 Disorder of teeth and supporting structures, unspecified: Secondary | ICD-10-CM | POA: Diagnosis not present

## 2017-01-27 DIAGNOSIS — K0889 Other specified disorders of teeth and supporting structures: Secondary | ICD-10-CM

## 2017-01-27 DIAGNOSIS — I10 Essential (primary) hypertension: Secondary | ICD-10-CM | POA: Insufficient documentation

## 2017-01-27 MED ORDER — IBUPROFEN 600 MG PO TABS: 600.0000 mg | ORAL_TABLET | Freq: Four times a day (QID) | ORAL | 0 refills | Status: DC | PRN

## 2017-01-27 MED ORDER — PENICILLIN V POTASSIUM 500 MG PO TABS: 500.0000 mg | ORAL_TABLET | Freq: Four times a day (QID) | ORAL | 0 refills | Status: AC

## 2017-01-27 MED ORDER — BENZOCAINE 10 % MT GEL: 1.0000 "application " | OROMUCOSAL | 0 refills | Status: DC | PRN

## 2017-01-27 NOTE — ED Triage Notes (Signed)
Reports sharp intermittent pain in left upper side of mouth under partial plate.  Reports pain being inside gums.  Denies pain at this time but states when it comes its a 10.  BP elevated at this time.  Reports not taking bp meds yet for the day.

## 2017-01-27 NOTE — Discharge Instructions (Signed)
Pain seems to be nerve irritation related. Please take the meds prescribed. See a dentist.

## 2017-01-27 NOTE — ED Notes (Signed)
Pt. Reports upper left gum pain that woke her up from her sleep Thursday. Pt. Reports taking 400 mg. Of ibuprofen and reports having no pain. Gum does not appear swollen and/ or red. Pt. Reports wearing upper dentures and denies ill-fitting dentures.

## 2017-01-27 NOTE — ED Provider Notes (Signed)
[facility] DEPT Provider Note   CSN: [id number] Arrival date & time: 01/27/17  3785     History   Chief Complaint Chief Complaint  Patient presents with  . Dental Pain    HPI Penny Rice is a 61 y.o. female.  HPI  Pt comes in with cc of dental pain. Pt has hx of HL, HTN and has multiple lost teeth. She reports that she has been having tooth pain x 2-3 days. Pt has no associated n/v/f/c. Pt has no recent trauma.  Past Medical History:  Diagnosis Date  . Hyperlipidemia   . Hypertension     Patient Active Problem List   Diagnosis Date Noted  . Hypertension 09/28/2010    Past Surgical History:  Procedure Laterality Date  . ABDOMINAL HYSTERECTOMY      OB History    No data available       Home Medications    Prior to Admission medications   Medication Sig Start Date End Date Taking? Authorizing Provider  amLODipine (NORVASC) 10 MG tablet Take 10 mg by mouth daily.     Yes [provider]  carvedilol (COREG) 25 MG tablet Take 25 mg by mouth 2 (two) times daily. 01/16/17  Yes [provider]  losartan-hydrochlorothiazide (HYZAAR) 100-25 MG per tablet Take 1 tablet by mouth daily.   Yes [provider]  Vitamin D, Ergocalciferol, (DRISDOL) 50000 units CAPS capsule Take 50,000 Units by mouth See admin instructions. Take 1 capsule (50,000 units) by mouth on Wednesdays and Fridays at bedtime 12/21/16  Yes [provider]  benzocaine (ORAJEL) 10 % mucosal gel Use as directed 1 application in the mouth or throat as needed for mouth pain. 01/27/17   Varney Biles, MD  ibuprofen (ADVIL,MOTRIN) 600 MG tablet Take 1 tablet (600 mg total) by mouth every 6 (six) hours as needed. 01/27/17   Varney Biles, MD  penicillin v potassium (VEETID) 500 MG tablet Take 1 tablet (500 mg total) by mouth 4 (four) times daily. 01/27/17 02/03/17  Varney Biles, MD    Family History Family History  Problem Relation Age of Onset  . Hypertension Sister     . Colon cancer Neg Hx     Social History Social History  Substance Use Topics  . Smoking status: Never Smoker  . Smokeless tobacco: Never Used  . Alcohol use No     Allergies   Patient has no known allergies.   Review of Systems Review of Systems  Constitutional: Positive for activity change.  HENT: Positive for dental problem.   Allergic/Immunologic: Negative for immunocompromised state.  Hematological: Does not bruise/bleed easily.     Physical Exam Updated Vital Signs BP 140/80   Pulse 60   Temp 98.1 F (36.7 C) (Oral)   Resp 18   Ht 5\' 4"  (1.626 m)   Wt 83 kg (183 lb)   SpO2 98%   BMI 31.41 kg/m   Physical Exam  Constitutional: She is oriented to person, place, and time. She appears well-developed.  HENT:  Head: Normocephalic and atraumatic.  Missing teeth #10-12 There is no gingival swelling, no reproducible tenderness  Eyes: EOM are normal.  Neck: Normal range of motion. Neck supple.  Cardiovascular: Normal rate.   Pulmonary/Chest: Effort normal.  Abdominal: Bowel sounds are normal.  Lymphadenopathy:    She has no cervical adenopathy.  Neurological: She is alert and oriented to person, place, and time.  Skin: Skin is warm and dry.  Nursing note and vitals reviewed.  ED Treatments / Results  Labs (all labs ordered are listed, but only abnormal results are displayed) Labs Reviewed - No data to display  EKG  EKG Interpretation None       Radiology No results found.  Procedures Procedures (including critical care time)  Medications Ordered in ED Medications - No data to display   Initial Impression / Assessment and Plan / ED Course  I have reviewed the triage vital signs and the nursing notes.  Pertinent labs & imaging results that were available during my care of the patient were reviewed by me and considered in my medical decision making (see chart for details).     Pt has dental  Pain. Could be just nerve  irritation. Will tx with topical meds and give pen v k if symptoms are worsening. Will give dentist f.u. Pt is non toxic with no trismus, drooling and she is immunocompetent   Final Clinical Impressions(s) / ED Diagnoses   Final diagnoses:  Pain, dental    New Prescriptions New Prescriptions   BENZOCAINE (ORAJEL) 10 % MUCOSAL GEL    Use as directed 1 application in the mouth or throat as needed for mouth pain.   IBUPROFEN (ADVIL,MOTRIN) 600 MG TABLET    Take 1 tablet (600 mg total) by mouth every 6 (six) hours as needed.   PENICILLIN V POTASSIUM (VEETID) 500 MG TABLET    Take 1 tablet (500 mg total) by mouth 4 (four) times daily.     Varney Biles, MD 01/28/17 [phone]

## 2017-01-31 ENCOUNTER — Ambulatory Visit (HOSPITAL_COMMUNITY)
Admission: RE | Admit: 2017-01-31 | Discharge: 2017-01-31 | Disposition: A | Discharge status: Home / Self Care | Payer: BLUE CROSS/BLUE SHIELD | Source: Ambulatory Visit | Attending: Internal Medicine | Admitting: Internal Medicine

## 2017-01-31 ENCOUNTER — Other Ambulatory Visit: Payer: Self-pay | Admitting: Internal Medicine

## 2017-01-31 DIAGNOSIS — R7982 Elevated C-reactive protein (CRP): Secondary | ICD-10-CM | POA: Diagnosis not present

## 2017-01-31 DIAGNOSIS — E7800 Pure hypercholesterolemia, unspecified: Secondary | ICD-10-CM | POA: Diagnosis not present

## 2017-01-31 DIAGNOSIS — K1379 Other lesions of oral mucosa: Secondary | ICD-10-CM | POA: Insufficient documentation

## 2017-01-31 DIAGNOSIS — K068 Other specified disorders of gingiva and edentulous alveolar ridge: Secondary | ICD-10-CM

## 2017-01-31 DIAGNOSIS — E039 Hypothyroidism, unspecified: Secondary | ICD-10-CM | POA: Diagnosis not present

## 2017-01-31 DIAGNOSIS — J449 Chronic obstructive pulmonary disease, unspecified: Secondary | ICD-10-CM | POA: Diagnosis not present

## 2017-01-31 DIAGNOSIS — E612 Magnesium deficiency: Secondary | ICD-10-CM | POA: Diagnosis not present

## 2017-01-31 DIAGNOSIS — E559 Vitamin D deficiency, unspecified: Secondary | ICD-10-CM | POA: Diagnosis not present

## 2017-01-31 DIAGNOSIS — I10 Essential (primary) hypertension: Secondary | ICD-10-CM | POA: Diagnosis not present

## 2017-01-31 DIAGNOSIS — R51 Headache: Secondary | ICD-10-CM

## 2017-01-31 DIAGNOSIS — J329 Chronic sinusitis, unspecified: Secondary | ICD-10-CM | POA: Diagnosis not present

## 2017-01-31 DIAGNOSIS — R519 Headache, unspecified: Secondary | ICD-10-CM

## 2017-03-31 ENCOUNTER — Other Ambulatory Visit: Payer: Self-pay | Admitting: Internal Medicine

## 2017-03-31 DIAGNOSIS — Z1231 Encounter for screening mammogram for malignant neoplasm of breast: Secondary | ICD-10-CM

## 2017-04-06 DIAGNOSIS — E039 Hypothyroidism, unspecified: Secondary | ICD-10-CM | POA: Diagnosis not present

## 2017-04-06 DIAGNOSIS — J449 Chronic obstructive pulmonary disease, unspecified: Secondary | ICD-10-CM | POA: Diagnosis not present

## 2017-04-06 DIAGNOSIS — E7800 Pure hypercholesterolemia, unspecified: Secondary | ICD-10-CM | POA: Diagnosis not present

## 2017-04-06 DIAGNOSIS — I10 Essential (primary) hypertension: Secondary | ICD-10-CM | POA: Diagnosis not present

## 2017-04-21 ENCOUNTER — Ambulatory Visit
Admission: RE | Admit: 2017-04-21 | Discharge: 2017-04-21 | Disposition: A | Discharge status: Home / Self Care | Payer: BLUE CROSS/BLUE SHIELD | Source: Ambulatory Visit | Attending: Internal Medicine | Admitting: Internal Medicine

## 2017-04-21 DIAGNOSIS — Z1231 Encounter for screening mammogram for malignant neoplasm of breast: Secondary | ICD-10-CM | POA: Diagnosis not present

## 2017-04-21 LAB — HM MAMMOGRAPHY

## 2017-06-30 DIAGNOSIS — M7672 Peroneal tendinitis, left leg: Secondary | ICD-10-CM | POA: Diagnosis not present

## 2017-06-30 DIAGNOSIS — M25571 Pain in right ankle and joints of right foot: Secondary | ICD-10-CM | POA: Diagnosis not present

## 2017-06-30 DIAGNOSIS — M25572 Pain in left ankle and joints of left foot: Secondary | ICD-10-CM | POA: Diagnosis not present

## 2017-06-30 DIAGNOSIS — M79671 Pain in right foot: Secondary | ICD-10-CM | POA: Diagnosis not present

## 2017-07-12 ENCOUNTER — Ambulatory Visit (INDEPENDENT_AMBULATORY_CARE_PROVIDER_SITE_OTHER): Payer: BLUE CROSS/BLUE SHIELD | Admitting: Medical

## 2017-07-12 ENCOUNTER — Encounter: Payer: Self-pay | Admitting: Medical

## 2017-07-12 VITALS — BP 168/100 | HR 77 | Wt 187.4 lb

## 2017-07-12 DIAGNOSIS — H6123 Impacted cerumen, bilateral: Secondary | ICD-10-CM | POA: Diagnosis not present

## 2017-07-12 DIAGNOSIS — Z6832 Body mass index (BMI) 32.0-32.9, adult: Secondary | ICD-10-CM | POA: Diagnosis not present

## 2017-07-12 DIAGNOSIS — I10 Essential (primary) hypertension: Secondary | ICD-10-CM | POA: Diagnosis not present

## 2017-07-12 MED ORDER — LOSARTAN POTASSIUM-HCTZ 100-25 MG PO TABS: 1.0000 | ORAL_TABLET | Freq: Every day | ORAL | 0 refills | Status: DC

## 2017-07-12 MED ORDER — AMLODIPINE BESYLATE 10 MG PO TABS: 10.0000 mg | ORAL_TABLET | Freq: Every day | ORAL | 0 refills | Status: DC

## 2017-07-12 MED ORDER — CARVEDILOL 25 MG PO TABS: 25.0000 mg | ORAL_TABLET | Freq: Two times a day (BID) | ORAL | 0 refills | Status: DC

## 2017-07-12 NOTE — Progress Notes (Addendum)
Subjective:  Penny Rice is a 62 y.o. female who presents as a new patient.  Chief Complaint  Patient presents with  . New Patient (Initial Visit)    new patient , no concerns , get estab,care    She was seeing Dr. Jeanann Lewandowsky prior to him returning recently.  She has a history significant for high blood pressure.  She was taking all 3 of her blood pressure medicines regularly which include amlodipine, Coreg, losartan HCT until there was a concern about drug recall on the losartan HCT.  She reports history of a treadmill stress test years ago.  She denies any current symptoms such as chest pain, shortness of breath, swelling, palpitations.  She stopped the losartan HCT back in October 2018.  She knows her blood pressures have been running high since then.  She has some pressure in her ears, wants [place] to check her ears today.  No URI symptoms.  She currently is not exercising.  Not using any diet this patient.  No other aggravating or relieving factors.    No other c/o.  The following portions of the patient's history were reviewed and updated as appropriate: allergies, current medications, past family history, past medical history, past social history, past surgical history and problem list.  ROS Otherwise as in subjective above  Objective BP (!) 168/100   Pulse 77   Wt 187 lb 6.4 oz (85 kg)   SpO2 97%   BMI 32.17 kg/m   General appearance: alert, no distress, WD/WN HEENT: normocephalic, sclerae anicteric, conjunctiva pink and moist, ears with impacted cerumen bilateral, nares patent, no discharge or erythema, pharynx normal Oral cavity: MMM, no lesions Neck: supple, no lymphadenopathy, no thyromegaly, no masses, no bruits Heart: RRR, normal S1, S2, no murmurs Lungs: CTA bilaterally, no wheezes, rhonchi, or rales Pulses: 2+ radial pulses, 2+ pedal pulses, normal cap refill Ext: no edema    Adult ECG Report  Indication: HTN  Rate: 62 bpm  Rhythm: normal sinus rhythm  QRS Axis: 11 degrees  PR Interval: 134ms  QRS Duration: 40ms  QTc: 461ms  Conduction Disturbances: none  Other Abnormalities: nonspecific T wave abnormality  Patient's cardiac risk factors are: hypertension and obesity (BMI >= 30 kg/m2).  EKG comparison: none  Narrative Interpretation: abnormal EKG    Assessment: Encounter Diagnoses  Name Primary?  . Essential hypertension Yes  . BMI 32.0-32.9,adult   . Bilateral impacted cerumen      Plan: Hypertension Discussed diagnosis of hypertension, complications of uncontrolled high blood pressure, continue amlodipine and Coreg restart losartan HCT.  Advised to recheck with her pharmacist about drug recalls and let me know if there is an issue with the losartan HCT.  Limit salt intake, counseled on diet and exercise.  Follow-up in 3 months  Bilateral impacted cerumen Discussed findings.  Discussed risk/benefits of procedure and patient agrees to procedure. Successfully used warm water lavage to remove impacted cerumen from bialt ear canal.  Patient became dizzy during the procedure so we stopped the procedure.  We only got out about half the cerumen.  Discussed some home remedies she can try.  We will check the ears again on her next visit.  BMI 32.0-32.9,adult Counseled on diet, exercise, weight loss efforts  Penny Rice was seen today for new patient (initial visit).  Diagnoses and all orders for this visit:  Essential hypertension -     losartan-hydrochlorothiazide (HYZAAR) 100-25 MG tablet; Take 1 tablet by mouth daily. -     amLODipine (NORVASC) 10  MG tablet; Take 1 tablet (10 mg total) by mouth daily. -     carvedilol (COREG) 25 MG tablet; Take 1 tablet (25 mg total) by mouth 2 (two) times daily.  BMI 32.0-32.9,adult  Bilateral impacted cerumen    Follow up: 65mo

## 2017-07-12 NOTE — Assessment & Plan Note (Signed)
Discussed diagnosis of hypertension, complications of uncontrolled high blood pressure, continue amlodipine and Coreg restart losartan HCT.  Advised to recheck with her pharmacist about drug recalls and let me know if there is an issue with the losartan HCT.  Limit salt intake, counseled on diet and exercise.  Follow-up in 3 months

## 2017-07-12 NOTE — Assessment & Plan Note (Signed)
Discussed findings.  Discussed risk/benefits of procedure and patient agrees to procedure. Successfully used warm water lavage to remove impacted cerumen from bialt ear canal.  Patient became dizzy during the procedure so we stopped the procedure.  We only got out about half the cerumen.  Discussed some home remedies she can try.  We will check the ears again on her next visit.

## 2017-07-12 NOTE — Assessment & Plan Note (Signed)
Counseled on diet, exercise, weight loss efforts

## 2017-07-13 DIAGNOSIS — M79671 Pain in right foot: Secondary | ICD-10-CM | POA: Diagnosis not present

## 2017-07-13 DIAGNOSIS — M79672 Pain in left foot: Secondary | ICD-10-CM | POA: Diagnosis not present

## 2017-07-13 NOTE — Addendum Note (Signed)
Addended by: Carlena Hurl on: 07/13/2017 08:19 PM   Modules accepted: Orders

## 2017-07-19 ENCOUNTER — Telehealth: Payer: Self-pay | Admitting: Medical

## 2017-07-19 NOTE — Telephone Encounter (Signed)
Records received from Dr. Jeanann Lewandowsky. Sending back for review.

## 2017-07-24 DIAGNOSIS — M79672 Pain in left foot: Secondary | ICD-10-CM | POA: Diagnosis not present

## 2017-07-24 DIAGNOSIS — M79671 Pain in right foot: Secondary | ICD-10-CM | POA: Diagnosis not present

## 2017-08-03 ENCOUNTER — Encounter: Payer: Self-pay | Admitting: Medical

## 2017-09-06 ENCOUNTER — Telehealth: Payer: Self-pay | Admitting: Medical

## 2017-09-06 NOTE — Telephone Encounter (Signed)
I received notice from their insurer or pharmacy notifying of a recall on certain lots of Losartan blood pressure medication   Have them call pharmacy to check to see if the Losartan medication is one on the recall list   Not all versions of Losartan affected.  If we need to switch, then schedule appt

## 2017-09-06 NOTE — Telephone Encounter (Signed)
I called the pharmacy and they said the pharmacist stated that her Penny Rice is not effected by the recall.

## 2017-10-17 ENCOUNTER — Telehealth: Payer: Self-pay

## 2017-10-17 NOTE — Telephone Encounter (Signed)
Received fax refill request for Vitamin D2 50000/u (ergo) caps   Take one capsule by mouth twice a week to [facility] [address]

## 2017-10-17 NOTE — Telephone Encounter (Signed)
Received fax refill request for Labetalol 200 mg tabs take 1 by mouth twice daily to be sent to [facility] [address]

## 2017-10-23 NOTE — Telephone Encounter (Signed)
Labetalol was discontinued. Vitamin D 50000u was prescribed last year. Patient will need vitamin d labs drawn if refills are needed. Called patient to inquire, there was no answer.

## 2017-10-24 ENCOUNTER — Telehealth: Payer: Self-pay | Admitting: Family Medicine

## 2017-10-24 ENCOUNTER — Other Ambulatory Visit: Payer: Self-pay | Admitting: Medical

## 2017-10-24 DIAGNOSIS — I10 Essential (primary) hypertension: Secondary | ICD-10-CM

## 2017-10-24 NOTE — Telephone Encounter (Signed)
1- she is due for recheck now, so please schedule med check appt 2- per last visit she was on several medications including Coreg, but I don't show she is taking Labetalol, so please inquire 3- when I hear back I will send refills, but she is due refills on everything.  Please clarify the Labetalol vs Coreg

## 2017-10-24 NOTE — Telephone Encounter (Signed)
Patient has a med check scheduled for May 22.    Called pt home reached voice mail, need clarification on the Labetalol vs Coreg?

## 2017-10-24 NOTE — Telephone Encounter (Signed)
[facility] is asking for Vitamin D2  50,000 IU #30 Also, Labetalol 200 mg tab  #180

## 2017-10-25 ENCOUNTER — Encounter: Payer: Self-pay | Admitting: Medical

## 2017-10-25 ENCOUNTER — Ambulatory Visit (INDEPENDENT_AMBULATORY_CARE_PROVIDER_SITE_OTHER): Payer: BLUE CROSS/BLUE SHIELD | Admitting: Medical

## 2017-10-25 VITALS — BP 132/84 | HR 67 | Temp 98.4°F | Ht 65.0 in | Wt 195.0 lb

## 2017-10-25 DIAGNOSIS — R7989 Other specified abnormal findings of blood chemistry: Secondary | ICD-10-CM | POA: Diagnosis not present

## 2017-10-25 DIAGNOSIS — M79673 Pain in unspecified foot: Secondary | ICD-10-CM | POA: Diagnosis not present

## 2017-10-25 DIAGNOSIS — I10 Essential (primary) hypertension: Secondary | ICD-10-CM | POA: Diagnosis not present

## 2017-10-25 DIAGNOSIS — E559 Vitamin D deficiency, unspecified: Secondary | ICD-10-CM | POA: Diagnosis not present

## 2017-10-25 DIAGNOSIS — E785 Hyperlipidemia, unspecified: Secondary | ICD-10-CM | POA: Diagnosis not present

## 2017-10-25 NOTE — Progress Notes (Signed)
Subjective: Chief Complaint  Patient presents with  . Medication Management   Here for f/u.    Hypertension-compliant with Amlodipine 10mg  daily, Coreg 25mg  BID, but not taking Losartan HCT 100/25mg  due to recall information  Vit D deficiency - she took the weekly vit D for 3 months.  She ran out of this and is here to f/u.  She is nonfasting today  She is not exercising.  Hyperlipidemia based on prior labs.  She notes no prior statin therapy  Abnormal thyroid lab from earlier in the year reviewed-she denies prior thyroid medication.  Has ongoing heel pain.   Has seen podiatry.   Still has pain.    Past Medical History:  Diagnosis Date  . Allergy   . Arthritis   . History of tuberculosis   . Hyperlipidemia   . Hypertension    Current Outpatient Medications on File Prior to Visit  Medication Sig Dispense Refill  . amLODipine (NORVASC) 10 MG tablet Take 1 tablet (10 mg total) by mouth daily. 90 tablet 0  . carvedilol (COREG) 25 MG tablet Take 1 tablet (25 mg total) by mouth 2 (two) times daily. 180 tablet 0  . ibuprofen (ADVIL,MOTRIN) 600 MG tablet Take 1 tablet (600 mg total) by mouth every 6 (six) hours as needed. (Patient not taking: Reported on 10/25/2017) 20 tablet 0  . losartan-hydrochlorothiazide (HYZAAR) 100-25 MG tablet Take 1 tablet by mouth daily. (Patient not taking: Reported on 10/25/2017) 90 tablet 0  . meloxicam (MOBIC) 7.5 MG tablet TK 1 T PO BID  0  . Vitamin D, Ergocalciferol, (DRISDOL) 50000 units CAPS capsule Take 50,000 Units by mouth See admin instructions. Take 1 capsule (50,000 units) by mouth on Wednesdays and Fridays at bedtime     No current facility-administered medications on file prior to visit.    ROS as in subjective   Objective: BP 132/84   Pulse 67   Temp 98.4 F (36.9 C) (Oral)   Ht 5\' 5"  (1.651 m)   Wt 195 lb (88.5 kg)   SpO2 96%   BMI 32.45 kg/m   Wt Readings from Last 3 Encounters:  10/25/17 195 lb (88.5 kg)  07/12/17 187 lb  6.4 oz (85 kg)  01/27/17 183 lb (83 kg)   BP Readings from Last 3 Encounters:  10/25/17 132/84  07/12/17 (!) 168/100  01/27/17 128/80   General appearance: alert, no distress, WD/WN,  Neck: supple, no lymphadenopathy, no thyromegaly, no masses Heart: RRR, normal S1, S2, no murmurs Lungs: CTA bilaterally, no wheezes, rhonchi, or rales Ext: no edema Pulses: 2+ symmetric, upper and lower extremities, normal cap refill     Assessment: Encounter Diagnoses  Name Primary?  . Essential hypertension Yes  . Hyperlipidemia, unspecified hyperlipidemia type   . Abnormal thyroid blood test   . Vitamin D deficiency   . Heel pain, unspecified laterality      Plan: Hypertension-advise she contact pharmacy to check the safety of her losartan HCT.  Continue current medicines Hyperlipidemia- discussed importance of statin and heart disease risk reduction.  She will return this week for fasting labs Abnormal thyroid test-return for labs Vitamin D deficiency- return for labs in likely change to daily vitamin D Heel pain - discussed supportive measures, discussed night time foot splints.   F/u with podiatry.  Quentina was seen today for medication management.  Diagnoses and all orders for this visit:  Essential hypertension -     Comprehensive metabolic panel; Future -     TSH;  Future -     Hemoglobin A1c; Future -     T4, free; Future  Hyperlipidemia, unspecified hyperlipidemia type -     Lipid panel; Future  Abnormal thyroid blood test -     TSH; Future -     T4, free; Future  Vitamin D deficiency -     VITAMIN D 25 Hydroxy (Vit-D Deficiency, Fractures); Future  Heel pain, unspecified laterality

## 2017-10-25 NOTE — Patient Instructions (Signed)
Recommendations:  Plantar fascitis  Every morning try doing a tennis ball massage with feet on the floor and towel stretch behind the ball of the foot to stretch the plantar fascia  Order plantar fascitis night time 90 degree splints online, through [employer] for example.   They are typically about $25 each  Avoid going barefoot since your feet flatten out without good arch support  I recommend you use arch support shoe INSERTS.   This will help support the plantar fascia and arches  You can use over the counter pain reliever for worse pain    Plantar Fasciitis Plantar fasciitis is a painful foot condition that affects the heel. It occurs when the band of tissue that connects the toes to the heel bone (plantar fascia) becomes irritated. This can happen after exercising too much or doing other repetitive activities (overuse injury). The pain from plantar fasciitis can range from mild irritation to severe pain that makes it difficult for you to walk or move. The pain is usually worse in the morning or after you have been sitting or lying down for a while. What are the causes? This condition may be caused by:  Standing for long periods of time.  Wearing shoes that do not fit.  Doing high-impact activities, including running, aerobics, and ballet.  Being overweight.  Having an abnormal way of walking (gait).  Having tight calf muscles.  Having high arches in your feet.  Starting a new athletic activity.  What are the signs or symptoms? The main symptom of this condition is heel pain. Other symptoms include:  Pain that gets worse after activity or exercise.  Pain that is worse in the morning or after resting.  Pain that goes away after you walk for a few minutes.  How is this diagnosed? This condition may be diagnosed based on your signs and symptoms. Your health care provider will also do a physical exam to check for:  A tender area on the bottom of your foot.  A high  arch in your foot.  Pain when you move your foot.  Difficulty moving your foot.  You may also need to have imaging studies to confirm the diagnosis. These can include:  X-rays.  Ultrasound.  MRI.  How is this treated? Treatment for plantar fasciitis depends on the severity of the condition. Your treatment may include:  Rest, ice, and over-the-counter pain medicines to manage your pain.  Exercises to stretch your calves and your plantar fascia.  A splint that holds your foot in a stretched, upward position while you sleep (night splint).  Physical therapy to relieve symptoms and prevent problems in the future.  Cortisone injections to relieve severe pain.  Extracorporeal shock wave therapy (ESWT) to stimulate damaged plantar fascia with electrical impulses. It is often used as a last resort before surgery.  Surgery, if other treatments have not worked after 12 months.  Follow these instructions at home:  Take medicines only as directed by your health care provider.  Avoid activities that cause pain.  Roll the bottom of your foot over a bag of ice or a bottle of cold water. Do this for 20 minutes, 3-4 times a day.  Perform simple stretches as directed by your health care provider.  Try wearing athletic shoes with air-sole or gel-sole cushions or soft shoe inserts.  Wear a night splint while sleeping, if directed by your health care provider.  Keep all follow-up appointments with your health care provider. How is this prevented?    Do not perform exercises or activities that cause heel pain.  Consider finding low-impact activities if you continue to have problems.  Lose weight if you need to. The best way to prevent plantar fasciitis is to avoid the activities that aggravate your plantar fascia. Contact a health care provider if:  Your symptoms do not go away after treatment with home care measures.  Your pain gets worse.  Your pain affects your ability to move  or do your daily activities. This information is not intended to replace advice given to you by your health care provider. Make sure you discuss any questions you have with your health care provider. Document Released: 02/15/2001 Document Revised: 10/26/2015 Document Reviewed: 04/02/2014 Elsevier Interactive Patient Education  2018 [employer]    

## 2017-10-27 ENCOUNTER — Other Ambulatory Visit: Payer: Self-pay | Admitting: Medical

## 2017-10-27 DIAGNOSIS — I10 Essential (primary) hypertension: Secondary | ICD-10-CM

## 2017-10-31 ENCOUNTER — Telehealth: Payer: Self-pay

## 2017-10-31 NOTE — Telephone Encounter (Signed)
Left message for patient to call back, she needs vitamin D labs before we can refill Vitamin D per Dorothea Ogle.    Labetalol has been discontinued.

## 2017-11-01 ENCOUNTER — Other Ambulatory Visit: Payer: BLUE CROSS/BLUE SHIELD

## 2017-11-01 DIAGNOSIS — R7989 Other specified abnormal findings of blood chemistry: Secondary | ICD-10-CM

## 2017-11-01 DIAGNOSIS — E559 Vitamin D deficiency, unspecified: Secondary | ICD-10-CM | POA: Diagnosis not present

## 2017-11-01 DIAGNOSIS — E785 Hyperlipidemia, unspecified: Secondary | ICD-10-CM | POA: Diagnosis not present

## 2017-11-01 DIAGNOSIS — I10 Essential (primary) hypertension: Secondary | ICD-10-CM | POA: Diagnosis not present

## 2017-11-02 ENCOUNTER — Encounter: Payer: Self-pay | Admitting: Medical

## 2017-11-02 ENCOUNTER — Other Ambulatory Visit: Payer: Self-pay | Admitting: Medical

## 2017-11-02 DIAGNOSIS — I10 Essential (primary) hypertension: Secondary | ICD-10-CM

## 2017-11-02 LAB — COMPREHENSIVE METABOLIC PANEL
ALT: 17 IU/L (ref 0–32)
AST: 16 IU/L (ref 0–40)
Albumin/Globulin Ratio: 1.5 (ref 1.2–2.2)
Albumin: 4.2 g/dL (ref 3.6–4.8)
Alkaline Phosphatase: 87 IU/L (ref 39–117)
BUN/Creatinine Ratio: 16 (ref 12–28)
BUN: 13 mg/dL (ref 8–27)
Bilirubin Total: 0.4 mg/dL (ref 0.0–1.2)
CO2: 26 mmol/L (ref 20–29)
Calcium: 10.3 mg/dL (ref 8.7–10.3)
Chloride: 100 mmol/L (ref 96–106)
Creatinine, Ser: 0.79 mg/dL (ref 0.57–1.00)
GFR calc Af Amer: 94 mL/min/{1.73_m2} (ref >59)
GFR calc non Af Amer: 82 mL/min/{1.73_m2} (ref >59)
Globulin, Total: 2.8 g/dL (ref 1.5–4.5)
Glucose: 131 mg/dL — ABNORMAL HIGH (ref 65–99)
Potassium: 4 mmol/L (ref 3.5–5.2)
Sodium: 142 mmol/L (ref 134–144)
Total Protein: 7 g/dL (ref 6.0–8.5)

## 2017-11-02 LAB — LIPID PANEL
Chol/HDL Ratio: 4.5 ratio — ABNORMAL HIGH (ref 0.0–4.4)
Cholesterol, Total: 252 mg/dL — ABNORMAL HIGH (ref 100–199)
HDL: 56 mg/dL (ref >39)
LDL Calculated: 171 mg/dL — ABNORMAL HIGH (ref 0–99)
Triglycerides: 125 mg/dL (ref 0–149)
VLDL Cholesterol Cal: 25 mg/dL (ref 5–40)

## 2017-11-02 LAB — HEMOGLOBIN A1C
Est. average glucose Bld gHb Est-mCnc: 143 mg/dL
Hgb A1c MFr Bld: 6.6 % — ABNORMAL HIGH (ref 4.8–5.6)

## 2017-11-02 LAB — TSH: TSH: 1.32 u[IU]/mL (ref 0.450–4.500)

## 2017-11-02 LAB — VITAMIN D 25 HYDROXY (VIT D DEFICIENCY, FRACTURES): Vit D, 25-Hydroxy: 29.7 ng/mL — ABNORMAL LOW (ref 30.0–100.0)

## 2017-11-02 LAB — T4, FREE: Free T4: 0.85 ng/dL (ref 0.82–1.77)

## 2017-11-02 MED ORDER — AMLODIPINE BESYLATE 10 MG PO TABS: 10.0000 mg | ORAL_TABLET | Freq: Every day | ORAL | 3 refills | Status: DC

## 2017-11-02 MED ORDER — VITAMIN D 1000 UNITS PO TABS: 1000.0000 [IU] | ORAL_TABLET | Freq: Every day | ORAL | 3 refills | Status: DC

## 2017-11-02 MED ORDER — ROSUVASTATIN CALCIUM 10 MG PO TABS: 10.0000 mg | ORAL_TABLET | Freq: Every day | ORAL | 0 refills | Status: DC

## 2018-01-09 ENCOUNTER — Ambulatory Visit (INDEPENDENT_AMBULATORY_CARE_PROVIDER_SITE_OTHER): Payer: BLUE CROSS/BLUE SHIELD | Admitting: Family Medicine

## 2018-01-09 VITALS — BP 120/80 | HR 74 | Temp 99.5°F | Resp 16 | Ht 64.0 in | Wt 187.8 lb

## 2018-01-09 DIAGNOSIS — J069 Acute upper respiratory infection, unspecified: Secondary | ICD-10-CM

## 2018-01-09 DIAGNOSIS — B9789 Other viral agents as the cause of diseases classified elsewhere: Secondary | ICD-10-CM

## 2018-01-09 NOTE — Patient Instructions (Signed)
Keep taking Robitussin-DM but she can use NyQuil at night.  Let us just keep treating his symptoms and usually within a week you should be better so if you get down the road next weekend and you are not any better call us Monday and will put on the antibiotic.  Try Claritin at night also to help with the drainage

## 2018-01-09 NOTE — Progress Notes (Signed)
   Subjective:    Patient ID: Penny Rice, female    DOB: 1955/08/12, 62 y.o.   MRN: [id number]  HPI She complains of a 2-day history of sore throat, dry cough, right earache, fatigue with rhinorrhea but no fever, chills, shortness of breath.   Review of Systems     Objective:   Physical Exam Alert and in no distress. Tympanic membranes and canals are normal. Pharyngeal area is normal. Neck is supple without adenopathy or thyromegaly. Cardiac exam shows a regular sinus rhythm without murmurs or gallops. Lungs are clear to auscultation.        Assessment & Plan:  Viral URI with cough I explained that I thought this was more of a viral illness than a bacterial 1 recommend conservative care.  Explained that if her symptoms get worse towards the end of the week, give [place] a call.

## 2018-01-16 ENCOUNTER — Other Ambulatory Visit: Payer: Self-pay | Admitting: Medical

## 2018-01-16 DIAGNOSIS — I10 Essential (primary) hypertension: Secondary | ICD-10-CM

## 2018-01-28 ENCOUNTER — Other Ambulatory Visit: Payer: Self-pay | Admitting: Medical

## 2018-01-28 DIAGNOSIS — I10 Essential (primary) hypertension: Secondary | ICD-10-CM

## 2018-02-09 ENCOUNTER — Ambulatory Visit (INDEPENDENT_AMBULATORY_CARE_PROVIDER_SITE_OTHER): Payer: BLUE CROSS/BLUE SHIELD | Admitting: Medical

## 2018-02-09 ENCOUNTER — Encounter: Payer: Self-pay | Admitting: Medical

## 2018-02-09 VITALS — BP 122/70 | HR 66 | Temp 98.1°F | Resp 16 | Ht 65.0 in | Wt 190.6 lb

## 2018-02-09 DIAGNOSIS — E785 Hyperlipidemia, unspecified: Secondary | ICD-10-CM

## 2018-02-09 DIAGNOSIS — E559 Vitamin D deficiency, unspecified: Secondary | ICD-10-CM | POA: Diagnosis not present

## 2018-02-09 DIAGNOSIS — I10 Essential (primary) hypertension: Secondary | ICD-10-CM

## 2018-02-09 DIAGNOSIS — R7301 Impaired fasting glucose: Secondary | ICD-10-CM | POA: Diagnosis not present

## 2018-02-09 DIAGNOSIS — Z23 Encounter for immunization: Secondary | ICD-10-CM | POA: Diagnosis not present

## 2018-02-09 MED ORDER — LOSARTAN POTASSIUM-HCTZ 100-25 MG PO TABS: 1.0000 | ORAL_TABLET | Freq: Every day | ORAL | 3 refills | Status: DC

## 2018-02-09 MED ORDER — ROSUVASTATIN CALCIUM 10 MG PO TABS: ORAL_TABLET | ORAL | 3 refills | Status: DC

## 2018-02-09 MED ORDER — PROMETHAZINE-DM 6.25-15 MG/5ML PO SYRP: 5.0000 mL | ORAL_SOLUTION | Freq: Four times a day (QID) | ORAL | 0 refills | Status: DC | PRN

## 2018-02-09 MED ORDER — CARVEDILOL 25 MG PO TABS: ORAL_TABLET | ORAL | 3 refills | Status: DC

## 2018-02-09 MED ORDER — TERBINAFINE HCL 1 % EX CREA: 1.0000 "application " | TOPICAL_CREAM | Freq: Two times a day (BID) | CUTANEOUS | 0 refills | Status: DC

## 2018-02-09 NOTE — Progress Notes (Signed)
Subjective: Chief Complaint  Patient presents with  . Follow-up    follow up htn   Here for f/u.    Hypertension-compliant with Amlodipine 10mg  daily, Coreg 25mg  BID,  Losartan HCT 100/25mg   Vit D deficiency -compliant with Vit D supplement  Hyperlipidemia - had started Crestor from last visit but stopped after itching in inguinal region  She still has the inguinal itching and stays sweaty but no other itching.  Stopped Crestor months ago  Has thickened toenails she attributes to fungus  Has some irritated throat, cough, attributed to allergies, using Claritin   Past Medical History:  Diagnosis Date  . Allergy   . Arthritis   . History of tuberculosis   . Hyperlipidemia   . Hypertension    Current Outpatient Medications on File Prior to Visit  Medication Sig Dispense Refill  . amLODipine (NORVASC) 10 MG tablet Take 1 tablet (10 mg total) by mouth daily. 90 tablet 3  . aspirin EC 81 MG tablet Take 81 mg by mouth daily.    . cholecalciferol (VITAMIN D) 1000 units tablet Take 1 tablet (1,000 Units total) by mouth daily. 90 tablet 3  . ibuprofen (ADVIL,MOTRIN) 600 MG tablet Take 1 tablet (600 mg total) by mouth every 6 (six) hours as needed. 20 tablet 0  . meloxicam (MOBIC) 7.5 MG tablet TK 1 T PO BID  0   No current facility-administered medications on file prior to visit.    ROS as in subjective   Objective: BP 122/70   Pulse 66   Temp 98.1 F (36.7 C) (Oral)   Resp 16   Ht 5\' 5"  (1.651 m)   Wt 190 lb 9.6 oz (86.5 kg)   SpO2 97%   BMI 31.72 kg/m   Wt Readings from Last 3 Encounters:  02/09/18 190 lb 9.6 oz (86.5 kg)  01/09/18 187 lb 12.8 oz (85.2 kg)  10/25/17 195 lb (88.5 kg)   BP Readings from Last 3 Encounters:  02/09/18 122/70  01/09/18 120/80  10/25/17 132/84   General appearance: alert, no distress, WD/WN,  Neck: supple, no lymphadenopathy, no thyromegaly, no masses hent unremarkable Heart: RRR, normal S1, S2, no murmurs Lungs: CTA bilaterally,  no wheezes, rhonchi, or rales Ext: no edema Pulses: 2+ symmetric, upper and lower extremities, normal cap refill     Assessment: Encounter Diagnoses  Name Primary?  . Essential hypertension Yes  . Hyperlipidemia, unspecified hyperlipidemia type   . Vitamin D deficiency   . Impaired fasting blood sugar   . Need for influenza vaccination   . Need for pneumococcal vaccination      Plan: Hypertension- Continue current medicines  Hyperlipidemia- discussed importance of statin and heart disease risk reduction.  Begin back on Crestor  Vitamin D deficiency- lab today, c/t vit D supplementation  Impaired glucose - discussed 10/2017 lab findings, criteria for diabetes, counseled on diet, exercise, f/u, risks of complications with diabetes.    Cough, irritation in throat - c/t claritin, add cough medication, rest, hydrate well.   Tinea cruris, onychomycosis - begin topical cream as below.  Declines Lamisil oral  Counseled on the influenza virus vaccine.  Vaccine information sheet given.  Influenza vaccine given after consent obtained.  Counseled on the pneumococcal vaccine.  Vaccine information sheet given.  Pneumococcal vaccine PPSV23 given after consent obtained.  Arnitra was seen today for follow-up.  Diagnoses and all orders for this visit:  Essential hypertension -     carvedilol (COREG) 25 MG tablet; TAKE 1 TABLET(25  MG) BY MOUTH TWICE DAILY -     losartan-hydrochlorothiazide (HYZAAR) 100-25 MG tablet; Take 1 tablet by mouth daily.  Hyperlipidemia, unspecified hyperlipidemia type  Vitamin D deficiency -     VITAMIN D 25 Hydroxy (Vit-D Deficiency, Fractures)  Impaired fasting blood sugar -     Hemoglobin A1c  Need for influenza vaccination -     Flu Vaccine QUAD 6+ mos PF IM (Fluarix Quad PF)  Need for pneumococcal vaccination -     Pneumococcal polysaccharide vaccine 23-valent greater than or equal to 2yo subcutaneous/IM  Other orders -     rosuvastatin (CRESTOR) 10  MG tablet; TAKE 1 TABLET(10 MG) BY MOUTH AT BEDTIME -     promethazine-dextromethorphan (PROMETHAZINE-DM) 6.25-15 MG/5ML syrup; Take 5 mLs by mouth 4 (four) times daily as needed for cough. -     terbinafine (LAMISIL AT) 1 % cream; Apply 1 application topically 2 (two) times daily.

## 2018-02-10 LAB — VITAMIN D 25 HYDROXY (VIT D DEFICIENCY, FRACTURES): Vit D, 25-Hydroxy: 29.2 ng/mL — ABNORMAL LOW (ref 30.0–100.0)

## 2018-02-10 LAB — HEMOGLOBIN A1C
Est. average glucose Bld gHb Est-mCnc: 148 mg/dL
Hgb A1c MFr Bld: 6.8 % — ABNORMAL HIGH (ref 4.8–5.6)

## 2018-02-12 ENCOUNTER — Other Ambulatory Visit: Payer: Self-pay | Admitting: Medical

## 2018-02-12 MED ORDER — VITAMIN D 1000 UNITS PO TABS: 2000.0000 [IU] | ORAL_TABLET | Freq: Every day | ORAL | 3 refills | Status: DC

## 2018-04-10 DIAGNOSIS — H16293 Other keratoconjunctivitis, bilateral: Secondary | ICD-10-CM | POA: Diagnosis not present

## 2018-04-19 ENCOUNTER — Other Ambulatory Visit: Payer: Self-pay | Admitting: Medical

## 2018-04-19 DIAGNOSIS — I10 Essential (primary) hypertension: Secondary | ICD-10-CM

## 2018-05-14 ENCOUNTER — Other Ambulatory Visit: Payer: Self-pay | Admitting: Medical

## 2018-05-14 DIAGNOSIS — Z1231 Encounter for screening mammogram for malignant neoplasm of breast: Secondary | ICD-10-CM

## 2018-05-28 ENCOUNTER — Encounter: Payer: Self-pay | Admitting: Medical

## 2018-05-28 ENCOUNTER — Ambulatory Visit (INDEPENDENT_AMBULATORY_CARE_PROVIDER_SITE_OTHER): Payer: BLUE CROSS/BLUE SHIELD | Admitting: Medical

## 2018-05-28 ENCOUNTER — Ambulatory Visit: Payer: BLUE CROSS/BLUE SHIELD | Admitting: Medical

## 2018-05-28 VITALS — BP 162/98 | HR 74 | Temp 97.6°F | Wt 191.0 lb

## 2018-05-28 DIAGNOSIS — I10 Essential (primary) hypertension: Secondary | ICD-10-CM

## 2018-05-28 DIAGNOSIS — E559 Vitamin D deficiency, unspecified: Secondary | ICD-10-CM

## 2018-05-28 DIAGNOSIS — H6123 Impacted cerumen, bilateral: Secondary | ICD-10-CM

## 2018-05-28 DIAGNOSIS — E785 Hyperlipidemia, unspecified: Secondary | ICD-10-CM

## 2018-05-28 DIAGNOSIS — M25511 Pain in right shoulder: Secondary | ICD-10-CM | POA: Diagnosis not present

## 2018-05-28 DIAGNOSIS — R7301 Impaired fasting glucose: Secondary | ICD-10-CM | POA: Diagnosis not present

## 2018-05-28 DIAGNOSIS — H9193 Unspecified hearing loss, bilateral: Secondary | ICD-10-CM

## 2018-05-28 MED ORDER — VITAMIN D 1000 UNITS PO TABS: 2000.0000 [IU] | ORAL_TABLET | Freq: Every day | ORAL | 3 refills | Status: DC

## 2018-05-28 MED ORDER — LOSARTAN POTASSIUM-HCTZ 100-25 MG PO TABS: 1.0000 | ORAL_TABLET | Freq: Every day | ORAL | 3 refills | Status: DC

## 2018-05-28 NOTE — Progress Notes (Signed)
Subjective: Chief Complaint  Patient presents with  . other    follow on B/P, right ear popping and loss of hearing started two weks ago since than it has stopped. Right shoulder pain   Here for multiple concerns.  Few weeks ago started having some decreased hearing.  Used some oil in ear, gets popping but then can hear at times.  Feels clogged up.  No pain.  No cold symptoms, other than a little congestion.  Here for f/u on BP.   Taking Amlodipine 10mg  daily, Coreg 25mg  BID, losartan HCT 100/25mg  daily.  Takes her medications at bedtime.  Works day shift.  Having some right shoulder pain, wonders about arthritis.   No recent injury or trauma.  But lifts a lot during the day.  Unloads trucks and stocks.    hyperlipidemia - compliant with Crestor 10mg  daily.  Active on the job all day.  Eating relatively healthy.  Had some pain in face in past, wondered about bells palsy in the past, but had recent episode of pain.  This resolved a few weeks ago.  Past Medical History:  Diagnosis Date  . Allergy   . Arthritis   . History of tuberculosis   . Hyperlipidemia   . Hypertension    Current Outpatient Medications on File Prior to Visit  Medication Sig Dispense Refill  . amLODipine (NORVASC) 10 MG tablet Take 1 tablet (10 mg total) by mouth daily. 90 tablet 3  . aspirin EC 81 MG tablet Take 81 mg by mouth daily.    . carvedilol (COREG) 25 MG tablet TAKE 1 TABLET(25 MG) BY MOUTH TWICE DAILY 180 tablet 3  . ibuprofen (ADVIL,MOTRIN) 600 MG tablet Take 1 tablet (600 mg total) by mouth every 6 (six) hours as needed. 20 tablet 0  . meloxicam (MOBIC) 7.5 MG tablet TK 1 T PO BID  0  . rosuvastatin (CRESTOR) 10 MG tablet TAKE 1 TABLET(10 MG) BY MOUTH AT BEDTIME 90 tablet 3   No current facility-administered medications on file prior to visit.    ROS as in subjective   Objective: BP (!) 162/98 (BP Location: Left Arm, Patient Position: Sitting)   Pulse 74   Temp 97.6 F (36.4 C)   Wt 191 lb  (86.6 kg)   SpO2 99%   BMI 31.78 kg/m   Wt Readings from Last 3 Encounters:  05/28/18 191 lb (86.6 kg)  02/09/18 190 lb 9.6 oz (86.5 kg)  01/09/18 187 lb 12.8 oz (85.2 kg)   BP Readings from Last 3 Encounters:  05/28/18 (!) 162/98  02/09/18 122/70  01/09/18 120/80     General appearance: alert, no distress, WD/WN,  HEENT: normocephalic, sclerae anicteric, impacted cerumen bilat, PERRLA, EOMi, nares patent, no discharge or erythema, pharynx normal Oral cavity: MMM, no lesions Neck: supple, no lymphadenopathy, no thyromegaly, no masses Heart: RRR, normal S1, S2, no murmurs Lungs: CTA bilaterally, no wheezes, rhonchi, or rales Musculoskeletal: Tender over right deltoid laterally and superiorly otherwise shoulder nontender, no laxity, normal range of motion, negative special test, rest of arm and upper back exam unremarkable.   Extremities: no edema, no cyanosis, no clubbing Pulses: 2+ symmetric, upper and lower extremities, normal cap refill Neurological: alert, oriented x 3, CN2-12 intact, strength normal upper extremities and lower extremities, sensation normal throughout, DTRs 2+ throughout, no cerebellar signs, gait normal Psychiatric: normal affect, behavior normal, pleasant     Assessment: Encounter Diagnoses  Name Primary?  . Essential hypertension Yes  . Acute pain of  right shoulder   . Vitamin D deficiency   . Impaired fasting blood sugar   . Hyperlipidemia, unspecified hyperlipidemia type   . Decreased hearing of both ears   . Impacted cerumen of both ears      Plan: Discussed symptoms, concerns, made unsuccessful attempt to remove cerumen by lavage.   Hypertension/high blood pressure  Your carvedilol/Coreg is twice daily but your other 2 blood pressure medicine should be once a day in the morning instead of at nighttime  Tonight take your dose early, and then resume your regular doses tomorrow late morning  Limit salt, eat a healthy low-fat diet, continue  regular exercise  High cholesterol  Continue Crestor started last visit  Return at your convenience for fasting labs first thing in the morning  Vitamin D deficiency-continue vitamin D sent to the pharmacy  Excess earwax You can use over-the-counter earwax remover a few drops weekly,  or you can use a  1:1 mixture of rubbing alcohol and white vinegar (half white vinegar, half rubbing alcohol).  Put a few drops in each ear 2-3 times a week for a few weeks to help dissolve some of the wax.  If this doesn't seem successful, return for recheck.  Right shoulder pain  When you have a flareup of pain you can use some over-the-counter Tylenol or Aleve for a few days, ice pack such as frozen bag of peas on the shoulder for 20 minutes at a time, and you can use an arm sling for an hour at a time for relief  Stretch daily  Continue regular exercise , but try to rest her shoulder when possible for the next few days  If your pain persist we can get an x-ray  Tillie was seen today for other.  Diagnoses and all orders for this visit:  Essential hypertension -     losartan-hydrochlorothiazide (HYZAAR) 100-25 MG tablet; Take 1 tablet by mouth daily.  Acute pain of right shoulder  Vitamin D deficiency -     VITAMIN D 25 Hydroxy (Vit-D Deficiency, Fractures); Future  Impaired fasting blood sugar  Hyperlipidemia, unspecified hyperlipidemia type -     Lipid panel; Future  Decreased hearing of both ears  Impacted cerumen of both ears  Other orders -     cholecalciferol (VITAMIN D) 1000 units tablet; Take 2 tablets (2,000 Units total) by mouth daily.  f/u soon for fasting lab

## 2018-05-28 NOTE — Patient Instructions (Signed)
Hypertension/high blood pressure  Your carvedilol/Coreg is twice daily but your other 2 blood pressure medicine should be once a day in the morning instead of at nighttime  Tonight take your dose early, and then resume your regular doses tomorrow late morning  Limit salt, eat a healthy low-fat diet, continue regular exercise  High cholesterol  Continue Crestor started last visit  Return at your convenience for fasting labs first thing in the morning  Vitamin D deficiency-continue vitamin D sent to the pharmacy  Excess earwax You can use over-the-counter earwax remover a few drops weekly,  or you can use a  1:1 mixture of rubbing alcohol and white vinegar (half white vinegar, half rubbing alcohol).  Put a few drops in each ear 2-3 times a week for a few weeks to help dissolve some of the wax.  If this doesn't seem successful, return for recheck.  Right shoulder pain  When you have a flareup of pain you can use some over-the-counter Tylenol or Aleve for a few days, ice pack such as frozen bag of peas on the shoulder for 20 minutes at a time, and you can use an arm sling for an hour at a time for relief  Stretch daily  Continue regular exercise , but try to rest her shoulder when possible for the next few days  If your pain persist we can get an x-ray

## 2018-06-15 ENCOUNTER — Other Ambulatory Visit (INDEPENDENT_AMBULATORY_CARE_PROVIDER_SITE_OTHER): Payer: BLUE CROSS/BLUE SHIELD

## 2018-06-15 DIAGNOSIS — E785 Hyperlipidemia, unspecified: Secondary | ICD-10-CM

## 2018-06-15 DIAGNOSIS — E559 Vitamin D deficiency, unspecified: Secondary | ICD-10-CM | POA: Diagnosis not present

## 2018-06-16 LAB — LIPID PANEL
Chol/HDL Ratio: 3.1 ratio (ref 0.0–4.4)
Cholesterol, Total: 153 mg/dL (ref 100–199)
HDL: 49 mg/dL (ref >39)
LDL Calculated: 85 mg/dL (ref 0–99)
Triglycerides: 95 mg/dL (ref 0–149)
VLDL Cholesterol Cal: 19 mg/dL (ref 5–40)

## 2018-06-16 LAB — VITAMIN D 25 HYDROXY (VIT D DEFICIENCY, FRACTURES): Vit D, 25-Hydroxy: 31.2 ng/mL (ref 30.0–100.0)

## 2018-06-19 ENCOUNTER — Other Ambulatory Visit: Payer: Self-pay | Admitting: Medical

## 2018-06-21 ENCOUNTER — Ambulatory Visit
Admission: RE | Admit: 2018-06-21 | Discharge: 2018-06-21 | Disposition: A | Discharge status: Home / Self Care | Payer: BLUE CROSS/BLUE SHIELD | Source: Ambulatory Visit | Attending: Medical | Admitting: Medical

## 2018-06-21 DIAGNOSIS — Z1231 Encounter for screening mammogram for malignant neoplasm of breast: Secondary | ICD-10-CM

## 2018-11-19 ENCOUNTER — Telehealth: Payer: Self-pay | Admitting: General Practice

## 2018-11-19 ENCOUNTER — Telehealth: Payer: Self-pay | Admitting: Family Medicine

## 2018-11-19 ENCOUNTER — Other Ambulatory Visit: Payer: BLUE CROSS/BLUE SHIELD

## 2018-11-19 DIAGNOSIS — R6889 Other general symptoms and signs: Secondary | ICD-10-CM | POA: Diagnosis not present

## 2018-11-19 DIAGNOSIS — Z20822 Contact with and (suspected) exposure to covid-19: Secondary | ICD-10-CM

## 2018-11-19 NOTE — Telephone Encounter (Signed)
Pt has been scheduled for covid testing.   Scheduled with pt directly.   Pt was referred by: Carlena Hurl, PA-C

## 2018-11-19 NOTE — Telephone Encounter (Signed)
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 Pt ph [phone] I sent request to [facility] for testing.

## 2018-11-19 NOTE — Telephone Encounter (Signed)
Pt has been scheduled for covid testing.  ° °

## 2018-11-19 NOTE — Telephone Encounter (Signed)
Do I need to do anything else with this?

## 2018-11-19 NOTE — Telephone Encounter (Signed)
-----   Message from Patience Musca sent at 11/19/2018  8:54 AM EDT ----- PLEASE set up for Covid test per Crisoforo Oxford.  Thanks, Beverlee Nims

## 2018-11-19 NOTE — Addendum Note (Signed)
Addended by: Denman George on: 11/19/2018 11:07 AM   Modules accepted: Orders

## 2018-11-20 NOTE — Telephone Encounter (Signed)
Nothing until results come to you

## 2018-11-21 LAB — NOVEL CORONAVIRUS, NAA: SARS-CoV-2, NAA: NOT DETECTED

## 2018-11-23 ENCOUNTER — Other Ambulatory Visit: Payer: Self-pay | Admitting: Medical

## 2018-11-23 DIAGNOSIS — I10 Essential (primary) hypertension: Secondary | ICD-10-CM

## 2018-12-17 ENCOUNTER — Encounter: Payer: Self-pay | Admitting: Medical

## 2018-12-17 ENCOUNTER — Other Ambulatory Visit: Payer: Self-pay

## 2018-12-17 ENCOUNTER — Ambulatory Visit (INDEPENDENT_AMBULATORY_CARE_PROVIDER_SITE_OTHER): Payer: BC Managed Care – PPO | Admitting: Medical

## 2018-12-17 VITALS — BP 132/80 | HR 75 | Temp 98.7°F | Ht 65.0 in | Wt 194.4 lb

## 2018-12-17 DIAGNOSIS — I10 Essential (primary) hypertension: Secondary | ICD-10-CM | POA: Diagnosis not present

## 2018-12-17 DIAGNOSIS — Z7189 Other specified counseling: Secondary | ICD-10-CM

## 2018-12-17 DIAGNOSIS — E785 Hyperlipidemia, unspecified: Secondary | ICD-10-CM

## 2018-12-17 DIAGNOSIS — R232 Flushing: Secondary | ICD-10-CM | POA: Insufficient documentation

## 2018-12-17 DIAGNOSIS — E559 Vitamin D deficiency, unspecified: Secondary | ICD-10-CM

## 2018-12-17 DIAGNOSIS — Z90710 Acquired absence of both cervix and uterus: Secondary | ICD-10-CM

## 2018-12-17 DIAGNOSIS — Z0000 Encounter for general adult medical examination without abnormal findings: Secondary | ICD-10-CM | POA: Diagnosis not present

## 2018-12-17 DIAGNOSIS — H6123 Impacted cerumen, bilateral: Secondary | ICD-10-CM | POA: Diagnosis not present

## 2018-12-17 DIAGNOSIS — Z7185 Encounter for immunization safety counseling: Secondary | ICD-10-CM

## 2018-12-17 DIAGNOSIS — Z780 Asymptomatic menopausal state: Secondary | ICD-10-CM

## 2018-12-17 DIAGNOSIS — R7301 Impaired fasting glucose: Secondary | ICD-10-CM

## 2018-12-17 NOTE — Progress Notes (Signed)
Subjective:   HPI  Penny Rice is a 63 y.o. female who presents for Chief Complaint  Patient presents with  . Annual Exam    Patient Care Team: Tysinger, Leward Quan as PCP - General (Family Medicine) Sees dentist Sees eye doctor  Concerns: Overall doing ok.    At night sometimes feet hurt, sometimes legs ache.   Not exercising regularly now. But she notes at times when walking regularly, she would feel better  Does get some night sweats, hot flashes.     Son got diagnosed with lung cancer recently this year, age <50, had surgery, was cured.   She is compliant with BP and cholesterol medications  Not exercising currently  Has some ongoing sinus congestion and phlegm in her throat for a week or 2, no cough, no fever, no covid contact.   Past Medical History:  Diagnosis Date  . Allergy   . Arthritis   . Hyperlipidemia   . Hypertension     Past Surgical History:  Procedure Laterality Date  . ABDOMINAL HYSTERECTOMY     pt reports full  . COLONOSCOPY  2016   Dr. Lucio Edward    Social History   Socioeconomic History  . Marital status: Married    Spouse name: Not on file  . Number of children: Not on file  . Years of education: Not on file  . Highest education level: Not on file  Occupational History  . Not on file  Social Needs  . Financial resource strain: Not on file  . Food insecurity    Worry: Not on file    Inability: Not on file  . Transportation needs    Medical: Not on file    Non-medical: Not on file  Tobacco Use  . Smoking status: Never Smoker  . Smokeless tobacco: Never Used  Substance and Sexual Activity  . Alcohol use: No    Alcohol/week: 0.0 standard drinks  . Drug use: No  . Sexual activity: Not on file  Lifestyle  . Physical activity    Days per week: Not on file    Minutes per session: Not on file  . Stress: Not on file  Relationships  . Social Herbalist on phone: Not on file    Gets together: Not on file   Attends religious service: Not on file    Active member of club or organization: Not on file    Attends meetings of clubs or organizations: Not on file    Relationship status: Not on file  . Intimate partner violence    Fear of current or ex partner: Not on file    Emotionally abused: Not on file    Physically abused: Not on file    Forced sexual activity: Not on file  Other Topics Concern  . Not on file  Social History Narrative   Lives with husband, has 2 sons, no grandchildren.   Works at [employer].   12/2018    Family History  Problem Relation Age of Onset  . Hypertension Sister   . Diabetes Sister   . Hypertension Mother   . Other Father        "violent death"  . Cancer Son        lung  . Hypertension Sister   . Hypertension Sister   . Hypertension Sister   . Hypertension Sister   . Colon cancer Neg Hx   . Heart disease Neg Hx   . Stroke Neg Hx  Current Outpatient Medications:  .  amLODipine (NORVASC) 10 MG tablet, TAKE 1 TABLET(10 MG) BY MOUTH DAILY, Disp: 90 tablet, Rfl: 0 .  aspirin EC 81 MG tablet, Take 81 mg by mouth daily., Disp: , Rfl:  .  carvedilol (COREG) 25 MG tablet, TAKE 1 TABLET(25 MG) BY MOUTH TWICE DAILY, Disp: 180 tablet, Rfl: 3 .  cholecalciferol (VITAMIN D) 1000 units tablet, Take 2 tablets (2,000 Units total) by mouth daily., Disp: 180 tablet, Rfl: 3 .  losartan-hydrochlorothiazide (HYZAAR) 100-25 MG tablet, Take 1 tablet by mouth daily., Disp: 90 tablet, Rfl: 3 .  rosuvastatin (CRESTOR) 10 MG tablet, TAKE 1 TABLET(10 MG) BY MOUTH AT BEDTIME, Disp: 90 tablet, Rfl: 3 .  ibuprofen (ADVIL,MOTRIN) 600 MG tablet, Take 1 tablet (600 mg total) by mouth every 6 (six) hours as needed. (Patient not taking: Reported on 12/17/2018), Disp: 20 tablet, Rfl: 0 .  meloxicam (MOBIC) 7.5 MG tablet, TK 1 T PO BID, Disp: , Rfl: 0  No Known Allergies    Reviewed their medical, surgical, family, social, medication, and allergy history and updated chart as  appropriate.   Review of Systems Constitutional: -fever, -chills, -sweats, -unexpected weight change, -decreased appetite, -fatigue Allergy: -sneezing, -itching, -congestion Dermatology: -changing moles, --rash, -lumps ENT: -runny nose, -ear pain, -sore throat, -hoarseness, -sinus pain, -teeth pain, - ringing in ears, -hearing loss, -nosebleeds Cardiology: -chest pain, -palpitations, -swelling, -difficulty breathing when lying flat, -waking up short of breath Respiratory: -cough, -shortness of breath, -difficulty breathing with exercise or exertion, -wheezing, -coughing up blood Gastroenterology: -abdominal pain, -nausea, -vomiting, -diarrhea, -constipation, -blood in stool, -changes in bowel movement, -difficulty swallowing or eating Hematology: -bleeding, -bruising  Musculoskeletal: -joint aches, -muscle aches, -joint swelling, -back pain, -neck pain, -cramping, -changes in gait Ophthalmology: denies vision changes, eye redness, itching, discharge Urology: -burning with urination, -difficulty urinating, -blood in urine, -urinary frequency, -urgency, -incontinence Neurology: -headache, -weakness, -tingling, -numbness, -memory loss, -falls, -dizziness Psychology: -depressed mood, -agitation, -sleep problems Breast/gyn: -breast tenderness, -discharge, -lumps, -vaginal discharge,- irregular periods, -heavy periods     Objective:  BP 132/80   Pulse 75   Temp 98.7 F (37.1 C) (Oral)   Ht 5\' 5"  (1.651 m)   Wt 194 lb 6.4 oz (88.2 kg)   SpO2 94%   BMI 32.35 kg/m   General appearance: alert, no distress, WD/WN, African American female Skin: unremarkable HEENT: normocephalic, conjunctiva/corneas normal, sclerae anicteric, PERRLA, EOMi, impacted cerumen bilat, nares patent, no discharge or erythema, pharynx normal Oral cavity: MMM, tongue normal, teeth in good repair Neck: supple, no lymphadenopathy, no thyromegaly, no masses, normal ROM, no bruits Chest: non tender, normal shape and  expansion Heart: RRR, normal S1, S2, no murmurs Lungs: CTA bilaterally, no wheezes, rhonchi, or rales Abdomen: +bs, soft, non tender, non distended, no masses, no hepatomegaly, no splenomegaly, no bruits Back: non tender, normal ROM, no scoliosis Musculoskeletal: upper extremities non tender, no obvious deformity, normal ROM throughout, lower extremities non tender, no obvious deformity, normal ROM throughout Extremities: no edema, no cyanosis, no clubbing Pulses: 2+ symmetric, upper and lower extremities, normal cap refill Neurological: alert, oriented x 3, CN2-12 intact, strength normal upper extremities and lower extremities, sensation normal throughout, DTRs 2+ throughout, no cerebellar signs, gait normal Psychiatric: normal affect, behavior normal, pleasant  Breast/gyn/rectal - deferred at patient request   Assessment and Plan :   Encounter Diagnoses  Name Primary?  . Encounter for health maintenance examination in adult Yes  . Vaccine counseling   . Impacted cerumen of  both ears   . Essential hypertension   . Impaired fasting blood sugar   . Vitamin D deficiency   . Hyperlipidemia, unspecified hyperlipidemia type   . S/P hysterectomy   . Post-menopausal   . Hot flashes     Physical exam - discussed and counseled on healthy lifestyle, diet, exercise, preventative care, vaccinations, sick and well care, proper use of emergency dept and after hours care, and addressed their concerns.    Health screening: Advised they see their eye doctor yearly for routine vision care. Advised they see their dentist yearly for routine dental care including hygiene visits twice yearly.  Cancer screening Counseled on self breast exams, mammograms  Colonoscopy:  Reviewed colonoscopy on file that is up to date, due repeat 2021  Mammogram up to date  She is s/p hysterectomy   Vaccinations: Advised yearly influenza vaccine Shingles vaccine:  I recommend you have a shingles vaccine to help  prevent shingles or herpes zoster outbreak.   Please call your insurer to inquire about coverage for the Shingrix vaccine given in 2 doses.   Some insurers cover this vaccine after age 35, some cover this after age 67.  If your insurer covers this, then call to schedule appointment to have this vaccine here.  She will check insurance coverage for Td vaccine as well    Separate significant chronic issues discussed: Refer to ENT for impacted cerumen due to unsuccessful attempts here prior to remove  HTN , hyperlipidemia - c/t current medication  return for fasting labs this week  counseled on diet, exercise, need to be more aggressive with healthy lifestyle changes    Penny Rice was seen today for annual exam.  Diagnoses and all orders for this visit:  Encounter for health maintenance examination in adult -     Comprehensive metabolic panel; Future -     Lipid panel; Future -     CBC with Differential/Platelet; Future -     Hemoglobin A1c; Future  Vaccine counseling  Impacted cerumen of both ears -     Ambulatory referral to ENT  Essential hypertension  Impaired fasting blood sugar  Vitamin D deficiency  Hyperlipidemia, unspecified hyperlipidemia type  S/P hysterectomy  Post-menopausal  Hot flashes   Follow-up pending labs, yearly for physical

## 2018-12-17 NOTE — Patient Instructions (Signed)
Shingles vaccine:  I recommend you have a shingles vaccine to help prevent shingles or herpes zoster outbreak.   Please call your insurer to inquire about coverage for the Shingrix vaccine given in 2 doses.   Some insurers cover this vaccine after age 61, some cover this after age 55.  If your insurer covers this, then call to schedule appointment to have this vaccine here.   Also call insurance about Tetanus diptheria vaccine TD vaccine as you are due for this as well.   You will be due for Colonoscpy next year 2021   Return for fasting labs this week   Exercise: I recommend exercising most days of the week using a type of exercise that you would enjoy and stick to such as walking, running, swimming, hiking, biking, aerobics, etc. This needs to be at least 30-40 minutes at a time, at least 5 days/week with moderate intensity.      Low Carb Diet recommendations:  I recommend you drink water throughout the day.   70 ounces or 2 liters would be a good amount.  If you have been accustomed to drinking juice or soda, try water with lemon or water with lime, or try using no calorie flavor dropper.  I recommend the following as an example meal plan that includes 3 meals per day.   You can skip some meals periodically for intermittent fasting.  Breakfast (choose one): Mar[place] Kitchen Omelette with egg, can include a little bit of cheese, your choice of mushrooms, peppers, onions, salsa . Smoothie with handful of spinach or kale, 1 cup of milk or water, 1 cup of berries, 1 packet of artificial sweetener such as stevia . Yogurt with fruit   Mid morning snack: . 1 fruit serving and 1 protein such as 8 almonds or 8 nuts, or vegetable such as carrots and humus or other similar vegetable   Lunch: . Salad with 3-4 ounces of lean grilled or baked meat such as fish, chicken or [place]   Mid afternoon snack: . 1 fruit serving and 1 protein such as 8 almonds or 8 nuts, or vegetable such as carrots and humus or  other similar vegetable   Dinner: . Large serving of vegetables and 3-4 ounces of lean grilled or baked meat such as fish, chicken or [place] . Or vegetarian dish without meat    Avoid  . Chips, cookies, cake, donuts, soda, sweet tea, juices, candy, fast food . For now , avoid, or significantly limit grains

## 2018-12-24 ENCOUNTER — Other Ambulatory Visit: Payer: Self-pay

## 2018-12-24 DIAGNOSIS — Z20822 Contact with and (suspected) exposure to covid-19: Secondary | ICD-10-CM

## 2018-12-27 LAB — NOVEL CORONAVIRUS, NAA: SARS-CoV-2, NAA: NOT DETECTED

## 2019-01-28 ENCOUNTER — Other Ambulatory Visit: Payer: Self-pay

## 2019-01-28 ENCOUNTER — Ambulatory Visit (INDEPENDENT_AMBULATORY_CARE_PROVIDER_SITE_OTHER): Payer: BC Managed Care – PPO | Admitting: Medical

## 2019-01-28 ENCOUNTER — Encounter: Payer: Self-pay | Admitting: Medical

## 2019-01-28 VITALS — BP 140/90 | HR 62 | Temp 98.1°F | Resp 16 | Ht 64.0 in | Wt 192.8 lb

## 2019-01-28 DIAGNOSIS — M19041 Primary osteoarthritis, right hand: Secondary | ICD-10-CM | POA: Diagnosis not present

## 2019-01-28 DIAGNOSIS — E785 Hyperlipidemia, unspecified: Secondary | ICD-10-CM | POA: Diagnosis not present

## 2019-01-28 DIAGNOSIS — M19042 Primary osteoarthritis, left hand: Secondary | ICD-10-CM

## 2019-01-28 DIAGNOSIS — Z0000 Encounter for general adult medical examination without abnormal findings: Secondary | ICD-10-CM | POA: Diagnosis not present

## 2019-01-28 DIAGNOSIS — M25562 Pain in left knee: Secondary | ICD-10-CM | POA: Diagnosis not present

## 2019-01-28 LAB — CBC WITH DIFFERENTIAL/PLATELET
Basophils Absolute: 0.1 10*3/uL (ref 0.0–0.2)
Basos: 1 %
EOS (ABSOLUTE): 0.2 10*3/uL (ref 0.0–0.4)
Eos: 2 %
Hematocrit: 40.7 % (ref 34.0–46.6)
Hemoglobin: 13.4 g/dL (ref 11.1–15.9)
Immature Grans (Abs): 0 10*3/uL (ref 0.0–0.1)
Immature Granulocytes: 0 %
Lymphocytes Absolute: 1.9 10*3/uL (ref 0.7–3.1)
Lymphs: 22 %
MCH: 29.3 pg (ref 26.6–33.0)
MCHC: 32.9 g/dL (ref 31.5–35.7)
MCV: 89 fL (ref 79–97)
Monocytes Absolute: 0.5 10*3/uL (ref 0.1–0.9)
Monocytes: 6 %
Neutrophils Absolute: 5.9 10*3/uL (ref 1.4–7.0)
Neutrophils: 69 %
Platelets: 261 10*3/uL (ref 150–450)
RBC: 4.57 x10E6/uL (ref 3.77–5.28)
RDW: 12.9 % (ref 11.7–15.4)
WBC: 8.5 10*3/uL (ref 3.4–10.8)

## 2019-01-28 LAB — COMPREHENSIVE METABOLIC PANEL
ALT: 22 IU/L (ref 0–32)
AST: 16 IU/L (ref 0–40)
Albumin/Globulin Ratio: 1.6 (ref 1.2–2.2)
Albumin: 4.2 g/dL (ref 3.8–4.8)
Alkaline Phosphatase: 81 IU/L (ref 39–117)
BUN/Creatinine Ratio: 16 (ref 12–28)
BUN: 14 mg/dL (ref 8–27)
Bilirubin Total: 0.5 mg/dL (ref 0.0–1.2)
CO2: 27 mmol/L (ref 20–29)
Calcium: 10.3 mg/dL (ref 8.7–10.3)
Chloride: 99 mmol/L (ref 96–106)
Creatinine, Ser: 0.85 mg/dL (ref 0.57–1.00)
GFR calc Af Amer: 86 mL/min/{1.73_m2} (ref >59)
GFR calc non Af Amer: 74 mL/min/{1.73_m2} (ref >59)
Globulin, Total: 2.6 g/dL (ref 1.5–4.5)
Glucose: 146 mg/dL — ABNORMAL HIGH (ref 65–99)
Potassium: 3.7 mmol/L (ref 3.5–5.2)
Sodium: 140 mmol/L (ref 134–144)
Total Protein: 6.8 g/dL (ref 6.0–8.5)

## 2019-01-28 LAB — HEMOGLOBIN A1C
Est. average glucose Bld gHb Est-mCnc: 160 mg/dL
Hgb A1c MFr Bld: 7.2 % — ABNORMAL HIGH (ref 4.8–5.6)

## 2019-01-28 LAB — LIPID PANEL
Chol/HDL Ratio: 3.1 ratio (ref 0.0–4.4)
Cholesterol, Total: 155 mg/dL (ref 100–199)
HDL: 50 mg/dL (ref >39)
LDL Calculated: 87 mg/dL (ref 0–99)
Triglycerides: 92 mg/dL (ref 0–149)
VLDL Cholesterol Cal: 18 mg/dL (ref 5–40)

## 2019-01-28 NOTE — Progress Notes (Signed)
Subjective Chief Complaint  Patient presents with  . leg pain    left leg pain back side of knee   Here for c/o pain in left leg.  Denies fall, injury, trauma.  She unloads trucks at work, works at [employer], but no specific injury.   Started hurting last week about 5 days ago.  Hurts in knee, some in mid lower leg.   Sitting and moving the leg is fine, but hurts to bear weight.   No pain in hip or ankle.  Has had feet issues in the past, standing on cement floors all day, but feet currently don't hurt.   No swelling in knee.   Tried some tylenol last night.   She also has longstanding history of bony change in fingers, some pain.  No other aggravating or relieving factors. No other complaint.    Past Medical History:  Diagnosis Date  . Allergy   . Arthritis   . Hyperlipidemia   . Hypertension    Current Outpatient Medications on File Prior to Visit  Medication Sig Dispense Refill  . amLODipine (NORVASC) 10 MG tablet TAKE 1 TABLET(10 MG) BY MOUTH DAILY 90 tablet 0  . aspirin EC 81 MG tablet Take 81 mg by mouth daily.    . carvedilol (COREG) 25 MG tablet TAKE 1 TABLET(25 MG) BY MOUTH TWICE DAILY 180 tablet 3  . cholecalciferol (VITAMIN D) 1000 units tablet Take 2 tablets (2,000 Units total) by mouth daily. 180 tablet 3  . losartan-hydrochlorothiazide (HYZAAR) 100-25 MG tablet Take 1 tablet by mouth daily. 90 tablet 3  . meloxicam (MOBIC) 7.5 MG tablet TK 1 T PO BID  0  . rosuvastatin (CRESTOR) 10 MG tablet TAKE 1 TABLET(10 MG) BY MOUTH AT BEDTIME 90 tablet 3  . ibuprofen (ADVIL,MOTRIN) 600 MG tablet Take 1 tablet (600 mg total) by mouth every 6 (six) hours as needed. (Patient not taking: Reported on 12/17/2018) 20 tablet 0   No current facility-administered medications on file prior to visit.    Past Surgical History:  Procedure Laterality Date  . ABDOMINAL HYSTERECTOMY     pt reports full  . COLONOSCOPY  2016   Dr. Lucio Edward     Objective: BP 140/90   Pulse 62   Temp 98.1  F (36.7 C) (Oral)   Resp 16   Ht 5\' 4"  (1.626 m)   Wt 192 lb 12.8 oz (87.5 kg)   SpO2 96%   BMI 33.09 kg/m   Gen: wd, wn, nad Skin: unremarkable, no bruising, no erythema significant bony arthritis  changes of PIPs bilat hands, and to a lesser extend bony arthritis changes in DIPs bilat hands Hands neurovascularly intact MSK:  Tender left knee medial joint line, some pain weight weight bearing, otherwise no swelling, no laxity, normal ROM, rest of lower nontender and normal ROM  Legs normal strength, sensation Pulses: WNL LE    Assessment: Encounter Diagnoses  Name Primary?  . Left knee pain, unspecified chronicity Yes  . Arthritis of both hands   . Encounter for health maintenance examination in adult      Plan: Discussed symptoms, concerns.  I suspect possible arthritis vs inflammation.   No obvious major  Injury, no laxity on exam.   Advised short relative rest, can use Tylenol or her Meloxicam the next 4-5 days, ice 20 minutes on 20 minutes off, and if symptoms don't improve return.    Can go for baseline xrays.  No other aggravating or relieving factors.  No other complaint.

## 2019-01-28 NOTE — Patient Instructions (Signed)
Please go to [facility] for your left xray.   Their hours are 8am - 3:30 pm Monday - Friday.  Take your insurance card with you.  [facility] [phone]  [facility] [address], [place], [place] [address]  [address], [place] [address]      Arthritis Arthritis means joint pain. It can also mean joint disease. A joint is a place where bones come together. There are more than 100 types of arthritis. What are the causes? This condition may be caused by:  Wear and tear of a joint. This is the most common cause.  A lot of acid in the blood, which leads to pain in the joint (gout).  Pain and swelling (inflammation) in a joint.  Infection of a joint.  Injuries in the joint.  A reaction to medicines (allergy). In some cases, the cause may not be known. What are the signs or symptoms? Symptoms of this condition include:  Redness at a joint.  Swelling at a joint.  Stiffness at a joint.  Warmth coming from the joint.  A fever.  A feeling of being sick. How is this treated? This condition may be treated with:  Treating the cause, if it is known.  Rest.  Raising (elevating) the joint.  Putting cold or hot packs on the joint.  Medicines to treat symptoms and reduce pain and swelling.  Shots of medicines (cortisone) into the joint. You may also be told to make changes in your life, such as doing exercises and losing weight. Follow these instructions at home: Medicines  Take over-the-counter and prescription medicines only as told by your doctor.  Do not take aspirin for pain if your doctor says that you may have gout. Activity  Rest your joint if your doctor tells you to.  Avoid activities that make the pain worse.  Exercise your joint regularly as told by your doctor. Try doing exercises like: ? Swimming. ? Water aerobics. ? Biking. ? Walking. Managing pain, stiffness, and swelling      If told, put ice on the affected  area. ? Put ice in a plastic bag. ? Place a towel between your skin and the bag. ? Leave the ice on for 20 minutes, 2-3 times per day.  If your joint is swollen, raise (elevate) it above the level of your heart if told by your doctor.  If your joint feels stiff in the morning, try taking a warm shower.  If told, put heat on the affected area. Do this as often as told by your doctor. Use the heat source that your doctor recommends, such as a moist heat pack or a heating pad. If you have diabetes, do not apply heat without asking your doctor. To apply heat: ? Place a towel between your skin and the heat source. ? Leave the heat on for 20-30 minutes. ? Remove the heat if your skin turns bright red. This is very important if you are unable to feel pain, heat, or cold. You may have a greater risk of getting burned. General instructions  Do not use any products that contain nicotine or tobacco, such as cigarettes, e-cigarettes, and chewing tobacco. If you need help quitting, ask your doctor.  Keep all follow-up visits as told by your doctor. This is important. Contact a doctor if:  The pain gets worse.  You have a fever. Get help right away if:  You have very bad pain in your joint.  You have swelling  in your joint.  Your joint is red.  Many joints become painful and swollen.  You have very bad back pain.  Your leg is very weak.  You cannot control your pee (urine) or poop (stool). Summary  Arthritis means joint pain. It can also mean joint disease. A joint is a place where bones come together.  The most common cause of this condition is wear and tear of a joint.  Symptoms of this condition include redness, swelling, or stiffness of the joint.  This condition is treated with rest, raising the joint, medicines, and putting cold or hot packs on the joint.  Follow your doctor's instructions about medicines, activity, exercises, and other home care treatments. This information  is not intended to replace advice given to you by your health care provider. Make sure you discuss any questions you have with your health care provider. Document Released: 08/17/2009 Document Revised: 04/30/2018 Document Reviewed: 04/30/2018 Elsevier Patient Education  2020 [employer].

## 2019-01-30 ENCOUNTER — Ambulatory Visit
Admission: RE | Admit: 2019-01-30 | Discharge: 2019-01-30 | Disposition: A | Discharge status: Home / Self Care | Payer: BC Managed Care – PPO | Source: Ambulatory Visit | Attending: Medical | Admitting: Medical

## 2019-01-30 DIAGNOSIS — M19041 Primary osteoarthritis, right hand: Secondary | ICD-10-CM | POA: Diagnosis not present

## 2019-01-30 DIAGNOSIS — M19042 Primary osteoarthritis, left hand: Secondary | ICD-10-CM | POA: Diagnosis not present

## 2019-01-30 DIAGNOSIS — M25562 Pain in left knee: Secondary | ICD-10-CM

## 2019-01-31 ENCOUNTER — Other Ambulatory Visit: Payer: Self-pay | Admitting: Medical

## 2019-01-31 DIAGNOSIS — M154 Erosive (osteo)arthritis: Secondary | ICD-10-CM

## 2019-01-31 DIAGNOSIS — M25562 Pain in left knee: Secondary | ICD-10-CM

## 2019-01-31 DIAGNOSIS — M25561 Pain in right knee: Secondary | ICD-10-CM

## 2019-02-01 ENCOUNTER — Other Ambulatory Visit: Payer: Self-pay | Admitting: Medical

## 2019-02-01 DIAGNOSIS — M2550 Pain in unspecified joint: Secondary | ICD-10-CM

## 2019-02-01 DIAGNOSIS — M2540 Effusion, unspecified joint: Secondary | ICD-10-CM

## 2019-02-01 MED ORDER — HYDROCODONE-ACETAMINOPHEN 5-325 MG PO TABS: 1.0000 | ORAL_TABLET | ORAL | 0 refills | Status: DC | PRN

## 2019-02-15 DIAGNOSIS — H6123 Impacted cerumen, bilateral: Secondary | ICD-10-CM | POA: Diagnosis not present

## 2019-02-15 DIAGNOSIS — H9209 Otalgia, unspecified ear: Secondary | ICD-10-CM | POA: Diagnosis not present

## 2019-02-15 DIAGNOSIS — H608X3 Other otitis externa, bilateral: Secondary | ICD-10-CM | POA: Diagnosis not present

## 2019-03-01 ENCOUNTER — Ambulatory Visit: Payer: BC Managed Care – PPO | Admitting: Medical

## 2019-03-01 ENCOUNTER — Other Ambulatory Visit: Payer: Self-pay

## 2019-03-01 ENCOUNTER — Encounter: Payer: Self-pay | Admitting: Medical

## 2019-03-01 VITALS — BP 152/98 | HR 67 | Temp 97.7°F | Ht 64.0 in | Wt 194.2 lb

## 2019-03-01 DIAGNOSIS — M25562 Pain in left knee: Secondary | ICD-10-CM | POA: Diagnosis not present

## 2019-03-01 DIAGNOSIS — E119 Type 2 diabetes mellitus without complications: Secondary | ICD-10-CM | POA: Insufficient documentation

## 2019-03-01 DIAGNOSIS — M2540 Effusion, unspecified joint: Secondary | ICD-10-CM | POA: Diagnosis not present

## 2019-03-01 DIAGNOSIS — E1169 Type 2 diabetes mellitus with other specified complication: Secondary | ICD-10-CM | POA: Diagnosis not present

## 2019-03-01 DIAGNOSIS — E785 Hyperlipidemia, unspecified: Secondary | ICD-10-CM

## 2019-03-01 DIAGNOSIS — M2550 Pain in unspecified joint: Secondary | ICD-10-CM | POA: Diagnosis not present

## 2019-03-01 DIAGNOSIS — I10 Essential (primary) hypertension: Secondary | ICD-10-CM

## 2019-03-01 MED ORDER — MELOXICAM 7.5 MG PO TABS: 7.5000 mg | ORAL_TABLET | Freq: Every day | ORAL | 0 refills | Status: DC

## 2019-03-01 MED ORDER — JARDIANCE 10 MG PO TABS: 10.0000 mg | ORAL_TABLET | Freq: Every day | ORAL | 0 refills | Status: DC

## 2019-03-01 NOTE — Patient Instructions (Addendum)
We will check some screening labs today for rheumatology  Continue plan to see Dr. Doran Durand.  Begin trial of Meloxicam arthritis pill to help with knee pain and finger joint arthritis.   Your labs show diabetes. Begin [name] 10mg  tablet daily to control sugars Start checking your sugars daily fasting in the morning and write these down Goal is 100-130 fasting glucose daily Make diet changes as below    Type 2 Diabetes  Diabetes is a long-lasting (chronic) disease.  With diabetes, either the pancreas does not make enough of a hormone called insulin, or the body has trouble using the insulin that is made.  Over time, diabetes can damage the eyes, kidneys, and nerves causing retinopathy, nephropathy, and neuropathy.  Diabetes puts you at risk for heart disease and peripheral vascular disease which can lead to heart attack, stroke, foot ulcers, and amputations.    Our goal and hopefully your goal is to manage your diabetes in such a way to slow the progression of the disease and do all we can to keep you healthy  Home Care:   Eat healthy, exercise regularly, limit alcohol, and don't smoke!  Check your blood sugar (glucose) once a day before breakfast, or as indicated by our discussion today.  Take your medications daily, don't run out of medications.  Learn about low blood sugar (hypoglycemia). Know how to treat it.  Wear a necklace or bracelet that says you have diabetes.  Check your feet every night for cuts, sores, blisters, and redness. Tell your medical provider if you have problems.  Maintain a normal body weight, or normal BMI - height to weight ratio of 20-25.  Ask me about this.  GET HELP RIGHT AWAY IF:  You have trouble keeping your blood sugar in target range.  You have problems with your medicines.  You are sick and not getting better after 24 hours.  You have a sore or wound that is not healing.  You have vision problems or changes.  You have a  fever.  Exercise regularly since it has beneficial effects on the heart and blood sugars. Exercising at least three times per week or 150 minutes per week can be as important as medication to a diabetic.  Find some form of exercise that you will enjoy doing regularly.  This can include walking, biking, kayaking, golfing, swimming, dance, aerobics, hiking, etc.  If you have joint problems, many local gyms have equipment to accommodate people with specific needs.    Vaccinations:  Diabetics are at increased risk for infection, and illnesses can take longer to resolve.  Current vaccine recommendations include yearly Influenza (flu) vaccine (recommended in October), Pneumococcal vaccine, Hepatitis B vaccine series, Tdap (tetanus, diptheria and pertussis) vaccine every 10 years, and other age appropriate vaccinations.     Office visits:  We recommend routine medical care to make sure we are addressing prevention and issues as they arise.  Typically this could mean twice yearly or up to quarterly depending upon your unique health situation.  Exams should include a yearly physical, a yearly foot exam, and other examination as appropriate.  You should see an eye doctor yearly to help screen for and prevent blood vessel complications in your eyes.  Labs: Diabetics should have blood work done at least twice yearly to monitor your Hemoglobin A1C (a three-month average of your blood sugars) and your cholesterol.  You should have your urine and blood checked yearly to screen for kidney damage.  This may include creatinine  and micro-albumin levels.  Other labs as appropriate.    Blood pressure goals:  Goal blood pressure in diabetics should be 130/80 or less. Monitoring your blood pressure with a home blood pressure cuff of your own is an excellent idea.  If you are prescribed medication for blood pressure, take your medication every day, and don't run out of medication.  Having high blood pressure can damage your  heart, eyes, kidneys, and put you at risk for heart attack and stroke.  Tobacco use:  If you smoke, dip or chew, quitting will reduce your risk of heart attack, stroke, peripheral vascular disease, and many cancers.    Diabetic Report Card for Penny Rice  March 01, 2019  Below is a summary of recent tests related to your diabetes that can help you manage your health.   Hemoglobin A1C:  Your Hemoglobin A1C values should be less than 7. If these are greater than 7, you have a higher chance of having eye, heart, and kidney problems in the future.   Your most recent Hemoglobin A1C values were:  Hemoglobin A1C (no units)  Date Value  02/03/2016 6.6   Hgb A1c MFr Bld (%)  Date Value  01/28/2019 7.2 (H)  02/09/2018 6.8 (H)  11/01/2017 6.6 (H)     Cholesterol:  Your LDL Cholesterol (bad cholesterol) values should be less than 100 mg/dL if you do not have cardiovascular disease.  The LDL should be less than 70 mg/dL if you do have cardiovascular disease.  If your LDL is consistently higher than 100 mg/dL, then your risk of heart attack and stroke increases yearly.   Your most recent LDL Cholesterol (bad cholesterol) results were:  LDL Calculated (mg/dL)  Date Value  01/28/2019 87  06/15/2018 85  11/01/2017 171 (H)     Your HDL Cholesterol (good cholesterol) values should be higher than 40 mg/dL.  If your HDL is lower than 40 mg/dL, this increases your risk of heart attack and stroke.      Blood Pressure:  Your blood pressure values should be less than 130/80. Please contact me if your readings at home are consistently higher than this.   Your most recent blood pressure readings at our clinic were:  BP Readings from Last 3 Encounters:  03/01/19 (!) 152/98  01/28/19 140/90  12/17/18 132/80    Urine Protein: Having an elevated microalbumin to creatinine ratio    Exercise: I recommend exercising most days of the week using a type of exercise that you would enjoy and  stick to such as walking, running, swimming, hiking, biking, aerobics, etc. This needs to be at least 30-40 minutes at a time, at least 5 days/week with moderate intensity.  This would be 60-70% of your maximum heart rate.  For example, your maximal heart rate would be 200- your age, multiplied x 0.6 to equal 60% of your maximal heart rate.    Thus, a person age 73 would have a maximum heart rate of 160.  60% of this would be 96 beats per minute.  Thus for fat burning exercise, a 61 year old would want to exercise has sustained heart rate of about 96 bpm for fat burning.  However for heart health, they would want to exercise at a higher heart rate of about 75% of maximum heart rate which would be a heart rate of about 120 beats per minute.  So I recommend a combination of doing some exercise at moderate intensity, and some exercise at vigorous intensity for heart  health.   Low Carb Diet recommendations:  I recommend you drink water throughout the day.   70 ounces or 2 liters would be a good amount.  If you have been accustomed to drinking juice or soda, try water with lemon or water with lime, or try using no calorie flavor dropper.  I recommend the following as an example meal plan that includes 3 meals per day.   You can skip some meals periodically for intermittent fasting.  Breakfast (choose one): [employer] Omelette with egg, can include a little bit of cheese, your choice of mushrooms, peppers, onions, salsa . Smoothie with handful of spinach or kale, 1 cup of milk or water, 1 cup of berries, 1 packet of artificial sweetener such as stevia . Yogurt with fruit   Mid morning snack: . 1 fruit serving and 1 protein such as 8 almonds or 8 nuts, or vegetable such as carrots and humus or other similar vegetable   Lunch: . Salad with 3-4 ounces of lean grilled or baked meat such as fish, chicken or [place]   Mid afternoon snack: . 1 fruit serving and 1 protein such as 8 almonds or 8 nuts, or vegetable  such as carrots and humus or other similar vegetable   Dinner: . Large serving of vegetables and 3-4 ounces of lean grilled or baked meat such as fish, chicken or [place] . Or vegetarian dish without meat    Avoid  . Chips, cookies, cake, donuts, soda, sweet tea, juices, candy, fast food . For now , avoid, or significantly limit grains

## 2019-03-01 NOTE — Progress Notes (Signed)
Subjective: Chief Complaint  Patient presents with  . Leg Pain    left    Here for f/u.  Here to follow-up on recent visit regarding hand joint pains, arthritis, left knee pain.  She continues to have left knee pain.  She used a few of the hydrocodone's last visit which helped.  Also here to discuss recent labs regarding blood sugars, medications.   Past Medical History:  Diagnosis Date  . Allergy   . Arthritis   . Hyperlipidemia   . Hypertension    Current Outpatient Medications on File Prior to Visit  Medication Sig Dispense Refill  . amLODipine (NORVASC) 10 MG tablet TAKE 1 TABLET(10 MG) BY MOUTH DAILY 90 tablet 0  . aspirin EC 81 MG tablet Take 81 mg by mouth daily.    . carvedilol (COREG) 25 MG tablet TAKE 1 TABLET(25 MG) BY MOUTH TWICE DAILY 180 tablet 3  . cholecalciferol (VITAMIN D) 1000 units tablet Take 2 tablets (2,000 Units total) by mouth daily. 180 tablet 3  . HYDROcodone-acetaminophen (NORCO/VICODIN) 5-325 MG tablet Take 1 tablet by mouth every 4 (four) hours as needed for moderate pain. 12 tablet 0  . losartan-hydrochlorothiazide (HYZAAR) 100-25 MG tablet Take 1 tablet by mouth daily. 90 tablet 3  . rosuvastatin (CRESTOR) 10 MG tablet TAKE 1 TABLET(10 MG) BY MOUTH AT BEDTIME 90 tablet 3   No current facility-administered medications on file prior to visit.     ROS as in subjective    Objective: BP (!) 152/98   Pulse 67   Temp 97.7 F (36.5 C)   Ht 5\' 4"  (1.626 m)   Wt 194 lb 3.2 oz (88.1 kg)   SpO2 98%   BMI 33.33 kg/m   BP Readings from Last 3 Encounters:  03/01/19 (!) 152/98  01/28/19 140/90  12/17/18 132/80   Gen: wd, wn, nad Mild tenderness left knee medial joint line, otherwise nontender, mild pain with varus and valgus stress otherwise no laxity no swelling no other deformity nontender otherwise. Legs neurovascularly intact No leg swelling    Assessment: Encounter Diagnoses  Name Primary?  . Polyarthralgia Yes  . Joint swelling    . Acute pain of left knee   . Type 2 diabetes mellitus with other specified complication, without long-term current use of insulin ([place])   . Essential hypertension   . Hyperlipidemia, unspecified hyperlipidemia type      Plan Polyarthralgia, joint swelling, left knee pain - we discussed her last visit regarding same.   discused her normal knee xray other than vascular disesase findings, and discussed the abnormal hand xrays with possible erosions and arthrtis.  Rheumatoid screenign labs today.   She will see Dr. Doran Durand ortho next week.  Begin meloxicam.  Diabetes -we discussed her recent labs showing hemoglobin A1c over 7%, blood sugar over 140.  She still does not seem to agree with diagnosis of diabetes.  We discussed the diagnosis, possible complications, reasons to get numbers under control.  Advise she begin trial of [name].  Discussed the risk and benefits and recent cardiac studies showing reduction in heart disease risk with SGLT2 medication.  Gave her samples to begin her [name].  Gave her a glucometer prescription to start checking blood sugars.  Counseled on diet, exercise, glucometer testing.  Continue medication for cholesterol, blood pressure   Aliviyah was seen today for leg pain.  Diagnoses and all orders for this visit:  Polyarthralgia -     Uric acid -  ANA -     Sedimentation rate -     Rheumatoid factor -     CYCLIC CITRUL PEPTIDE ANTIBODY, IGG/IGA  Joint swelling -     Uric acid -     ANA -     Sedimentation rate -     Rheumatoid factor -     CYCLIC CITRUL PEPTIDE ANTIBODY, IGG/IGA  Acute pain of left knee  Type 2 diabetes mellitus with other specified complication, without long-term current use of insulin ([facility])  Essential hypertension  Hyperlipidemia, unspecified hyperlipidemia type  Other orders -     meloxicam (MOBIC) 7.5 MG tablet; Take 1 tablet (7.5 mg total) by mouth daily. -     empagliflozin ([name]) 10 MG TABS tablet; Take 10 mg by  mouth daily before breakfast.   F/u 44mo

## 2019-03-03 LAB — URIC ACID: Uric Acid: 6.7 mg/dL (ref 2.5–7.1)

## 2019-03-03 LAB — SEDIMENTATION RATE: Sed Rate: 24 mm/hr (ref 0–40)

## 2019-03-03 LAB — ANA: Anti Nuclear Antibody (ANA): NEGATIVE

## 2019-03-03 LAB — RHEUMATOID FACTOR: Rheumatoid fact SerPl-aCnc: <10 IU/mL (ref 0.0–13.9)

## 2019-03-03 LAB — CYCLIC CITRUL PEPTIDE ANTIBODY, IGG/IGA: Cyclic Citrullin Peptide Ab: 5 units (ref 0–19)

## 2019-03-04 ENCOUNTER — Other Ambulatory Visit: Payer: Self-pay | Admitting: Medical

## 2019-03-04 DIAGNOSIS — I10 Essential (primary) hypertension: Secondary | ICD-10-CM

## 2019-03-04 MED ORDER — ROSUVASTATIN CALCIUM 10 MG PO TABS: ORAL_TABLET | ORAL | 3 refills | Status: DC

## 2019-03-04 MED ORDER — AMLODIPINE BESYLATE 10 MG PO TABS: ORAL_TABLET | ORAL | 0 refills | Status: DC

## 2019-03-04 MED ORDER — ASPIRIN EC 81 MG PO TBEC: 81.0000 mg | DELAYED_RELEASE_TABLET | Freq: Every day | ORAL | 3 refills | Status: DC

## 2019-03-08 DIAGNOSIS — M79672 Pain in left foot: Secondary | ICD-10-CM | POA: Diagnosis not present

## 2019-03-08 DIAGNOSIS — M2021 Hallux rigidus, right foot: Secondary | ICD-10-CM | POA: Diagnosis not present

## 2019-03-08 DIAGNOSIS — M79671 Pain in right foot: Secondary | ICD-10-CM | POA: Diagnosis not present

## 2019-03-08 DIAGNOSIS — M2022 Hallux rigidus, left foot: Secondary | ICD-10-CM | POA: Diagnosis not present

## 2019-03-10 ENCOUNTER — Telehealth: Payer: Self-pay | Admitting: Medical

## 2019-03-10 NOTE — Telephone Encounter (Signed)
P.A. [name] 

## 2019-03-17 NOTE — Telephone Encounter (Signed)
P.A. denied, pt needs trial of Metformin, Glipizide, Glyburide, or Pioglitazone.  Do you want to switch?

## 2019-03-19 NOTE — Telephone Encounter (Signed)
Current guidelines a standard of care is to start with an SGLT2 to treat blood sugar and to reduce risk of heart disease, so regardless of what she has not tried understand this is not allowed on her plan.  Can we push this issue or do we need to simply let her know insurance will not cover this and change to something else for now?

## 2019-03-22 ENCOUNTER — Encounter: Payer: Self-pay | Admitting: Medical

## 2019-03-23 NOTE — Telephone Encounter (Signed)
Appeal letter typed and faxed  

## 2019-04-10 NOTE — Telephone Encounter (Signed)
Samples [name] 10mg  given ok per Audelia Acton, left message for pt

## 2019-04-13 NOTE — Telephone Encounter (Signed)
Appeal denied for [name], states pt must follow step therapy and have trial of minimum of 30 days of one of the following Metformin, Glipizide-metformin, Glyburide-metformin or pioglitazone-metformin  Penny Acton do you want to switch her for 30 days then try switch back ?

## 2019-04-15 NOTE — Telephone Encounter (Signed)
Insurance is declining cover of the [name] as she has to try something else first due to insurance coverage is.  If agreeable I will change her to Metformin/glipizide combo.  This would be a once daily medication.  Sometimes people get diarrhea with this or nausea, and she should be checking blood sugars anytime she feels shaky or weak to make sure her blood sugar is not low.  We want her blood sugars to run between 80 and 130 fasting in the morning.  With this particular medicine make sure she has a glucometer and testing supplies.  If not call this out:  Glucometer, lancets, test strips, #100 testing supplies, 11 refills, diagnosis diabetes type 2.

## 2019-04-17 NOTE — Telephone Encounter (Signed)
Called pt and she is a little concerns about the Metformin said she has heard about bad side effects, I explained your notes and she wants to talk with someone first and call us back.

## 2019-04-30 ENCOUNTER — Other Ambulatory Visit: Payer: Self-pay | Admitting: Medical

## 2019-04-30 DIAGNOSIS — I10 Essential (primary) hypertension: Secondary | ICD-10-CM

## 2019-04-30 NOTE — Telephone Encounter (Signed)
Left message for pt to see what she decided to do about the medication.  Penny Rice sending this to you for follow up

## 2019-05-01 NOTE — Telephone Encounter (Signed)
I called and spoke to her today.    She has been making changes in diet.  Cut out mountain dew which was significant for her. Is eating healthier.  She has a glucometer and will start checking 3 days per week fasting, goal <130 glucose.  Counseled on diet, daily walking for exercise.   She DECLINES medication for diabetes today.   Reviewed 01/28/19 labs including HgbA1C 7.2%.   She will recheck 1-2 mo fasting.  Of note, see additional messages below about [name] denial and need to start with metformin or glipizide.

## 2019-06-11 ENCOUNTER — Ambulatory Visit: Payer: BC Managed Care – PPO | Attending: Internal Medicine

## 2019-06-11 DIAGNOSIS — Z20822 Contact with and (suspected) exposure to covid-19: Secondary | ICD-10-CM | POA: Diagnosis not present

## 2019-06-13 LAB — NOVEL CORONAVIRUS, NAA

## 2019-07-10 ENCOUNTER — Encounter: Payer: Self-pay | Admitting: Medical

## 2019-07-10 ENCOUNTER — Other Ambulatory Visit: Payer: Self-pay

## 2019-07-10 ENCOUNTER — Ambulatory Visit (INDEPENDENT_AMBULATORY_CARE_PROVIDER_SITE_OTHER): Payer: BC Managed Care – PPO | Admitting: Medical

## 2019-07-10 VITALS — BP 126/80 | HR 78 | Temp 98.7°F | Ht 64.0 in | Wt 194.8 lb

## 2019-07-10 DIAGNOSIS — I10 Essential (primary) hypertension: Secondary | ICD-10-CM

## 2019-07-10 DIAGNOSIS — M25562 Pain in left knee: Secondary | ICD-10-CM

## 2019-07-10 DIAGNOSIS — G8929 Other chronic pain: Secondary | ICD-10-CM

## 2019-07-10 MED ORDER — HYDROCODONE-ACETAMINOPHEN 5-325 MG PO TABS: 1.0000 | ORAL_TABLET | ORAL | 0 refills | Status: DC | PRN

## 2019-07-10 MED ORDER — MELOXICAM 15 MG PO TABS: 15.0000 mg | ORAL_TABLET | Freq: Every day | ORAL | 0 refills | Status: DC

## 2019-07-10 NOTE — Patient Instructions (Signed)
Recommendations:  Begin meloxicam 1 tablet daily for the next 7 days.  This is an anti-inflammatory medication that can help with pain and inflammation  If needed you can use the hydrocodone pain medicine 1 tablet every 4 hours as needed for worse pain  Ice the knee with a bag of frozen peas or bag of ice water for 20 minutes at a time.  Try to do this 2-3 times a day for the next few days  Elevate the knee on a pillow to rest the knee when possible the next several days  Avoid excessive walking in the next few days.  Try to rest in general  If you develop fever, more warm or hot knee, more swelling, pain in the knee then get rechecked immediately with either [place] or orthopedics  If you do not feel any improvement at all, not worse or better over the next week to 10 days, then we may need to have an MRI or heavy follow-up orthopedics

## 2019-07-10 NOTE — Progress Notes (Signed)
Subjective:  Penny Rice is a 64 y.o. female who presents for Chief Complaint  Patient presents with  . Knee Pain    left      Here for c/o left knee pain.   I saw her for same in 02/2019.  Has had some soreness in lower leg anteriorly, pains in in knee front and back.   After visit in 02/2019 she saw Dr. Doran Durand, orthopedics.  Ended up seeing Dr. Doran Durand, was advised to changes shoes, but no other specific treatment.    Recently been having some unsteadiness of left knee, pain generalized and mild swelling.   compliant with her BP medications at home, Coreg 25mg  BID, Amlodipine 10mg  daily, Losartan HCT 100/25mg  daily.  Has been eating more vegetables, stopped soda.   Not exercising of late given knee pain.   Home BPs generally 120/70s.    No other aggravating or relieving factors.    No other c/o.  The following portions of the patient's history were reviewed and updated as appropriate: allergies, current medications, past family history, past medical history, past social history, past surgical history and problem list.  ROS Otherwise as in subjective above  Objective: BP (!) 166/98   Pulse 78   Temp 98.7 F (37.1 C)   Ht 5\' 4"  (1.626 m)   Wt 194 lb 12.8 oz (88.4 kg)   SpO2 96%   BMI 33.44 kg/m    BP Readings from Last 3 Encounters:  07/10/19 (!) 166/98  03/01/19 (!) 152/98  01/28/19 140/90   General appearance: alert, no distress, well developed, well nourished Skin: slightly warm of left knee in general, no redness Left knee with mild generalized swelling, tender superior and medial to patella, otherwise nontender, mild pain with anterior drawer without obvious laxity, otherwise ROM without pain and no other laxity.  Hips and ankles nontender, no other deformity Legs neurovascularly intact    Assessment: Encounter Diagnoses  Name Primary?  . Chronic pain of left knee Yes  . Essential hypertension      Plan: Discussed findings.  We discussed differential which could  include gout, inflammation, possible ligament injury, versus infection.  There is slight warmth of the knee and mild swelling but she has no recent injury or break in the skin that would make infection more likely.  Discussed risk and benefits of procedure.  Patient gives consent.  Cleaned and prepped area in usual sterile fashion.  Use 1% lidocaine for local anesthesia.  Injected the left knee with a lateral approach with  with a 22-gauge 1.5 inch needle (10cc syringe).  I was not successful in aspirating any fluid.  Withdrew the needle and covered with sterile bandage.  Patient Instructions  Recommendations:  Begin meloxicam 1 tablet daily for the next 7 days.  This is an anti-inflammatory medication that can help with pain and inflammation  If needed you can use the hydrocodone pain medicine 1 tablet every 4 hours as needed for worse pain  Ice the knee with a bag of frozen peas or bag of ice water for 20 minutes at a time.  Try to do this 2-3 times a day for the next few days  Elevate the knee on a pillow to rest the knee when possible the next several days  Avoid excessive walking in the next few days.  Try to rest in general  If you develop fever, more warm or hot knee, more swelling, pain in the knee then get rechecked immediately with either [place] or  orthopedics  If you do not feel any improvement at all, not worse or better over the next week to 10 days, then we may need to have an MRI or heavy follow-up orthopedics    HTN - home readings are reportedly normal.  C/t same medication.   Penny Rice was seen today for knee pain.  Diagnoses and all orders for this visit:  Chronic pain of left knee  Essential hypertension  Other orders -     meloxicam (MOBIC) 15 MG tablet; Take 1 tablet (15 mg total) by mouth daily. -     HYDROcodone-acetaminophen (NORCO/VICODIN) 5-325 MG tablet; Take 1 tablet by mouth every 4 (four) hours as needed for moderate pain.    Follow up: call report 1  week

## 2019-07-24 ENCOUNTER — Other Ambulatory Visit: Payer: Self-pay | Admitting: Medical

## 2019-07-24 DIAGNOSIS — I10 Essential (primary) hypertension: Secondary | ICD-10-CM

## 2019-07-24 DIAGNOSIS — Z1231 Encounter for screening mammogram for malignant neoplasm of breast: Secondary | ICD-10-CM

## 2019-07-30 DIAGNOSIS — M25561 Pain in right knee: Secondary | ICD-10-CM | POA: Diagnosis not present

## 2019-07-30 DIAGNOSIS — M25562 Pain in left knee: Secondary | ICD-10-CM | POA: Diagnosis not present

## 2019-07-30 DIAGNOSIS — M25462 Effusion, left knee: Secondary | ICD-10-CM | POA: Diagnosis not present

## 2019-08-01 ENCOUNTER — Telehealth: Payer: Self-pay | Admitting: Medical

## 2019-08-05 NOTE — Telephone Encounter (Signed)
Patient stated she is dong better and she saw ortho and they gave her a shot. She will follow up with them in 3 weeks.

## 2019-08-05 NOTE — Telephone Encounter (Signed)
Call and see if doing any better with knee, or still having same problems?  I was thinking of her and wanted to check in.

## 2019-08-20 DIAGNOSIS — M25462 Effusion, left knee: Secondary | ICD-10-CM | POA: Diagnosis not present

## 2019-08-20 DIAGNOSIS — M1712 Unilateral primary osteoarthritis, left knee: Secondary | ICD-10-CM | POA: Diagnosis not present

## 2019-08-28 ENCOUNTER — Other Ambulatory Visit: Payer: Self-pay

## 2019-08-28 ENCOUNTER — Ambulatory Visit
Admission: RE | Admit: 2019-08-28 | Discharge: 2019-08-28 | Disposition: A | Discharge status: Home / Self Care | Payer: BC Managed Care – PPO | Source: Ambulatory Visit | Attending: Medical | Admitting: Medical

## 2019-08-28 DIAGNOSIS — Z1231 Encounter for screening mammogram for malignant neoplasm of breast: Secondary | ICD-10-CM

## 2019-11-26 ENCOUNTER — Telehealth: Payer: Self-pay

## 2019-11-26 NOTE — Telephone Encounter (Signed)
I called the pt. Per Audelia Acton to get her scheduled for a COE pt. Is due soon and is past due for an A1C check.

## 2019-12-18 ENCOUNTER — Encounter: Payer: Self-pay | Admitting: Gastroenterology

## 2020-01-01 ENCOUNTER — Other Ambulatory Visit: Payer: Self-pay | Admitting: Medical

## 2020-01-01 DIAGNOSIS — I10 Essential (primary) hypertension: Secondary | ICD-10-CM

## 2020-01-07 ENCOUNTER — Ambulatory Visit (INDEPENDENT_AMBULATORY_CARE_PROVIDER_SITE_OTHER): Payer: BC Managed Care – PPO | Admitting: Medical

## 2020-01-07 ENCOUNTER — Other Ambulatory Visit: Payer: Self-pay

## 2020-01-07 ENCOUNTER — Ambulatory Visit
Admission: RE | Admit: 2020-01-07 | Discharge: 2020-01-07 | Disposition: A | Discharge status: Home / Self Care | Payer: BC Managed Care – PPO | Source: Ambulatory Visit | Attending: Medical | Admitting: Medical

## 2020-01-07 ENCOUNTER — Encounter: Payer: Self-pay | Admitting: Medical

## 2020-01-07 VITALS — BP 122/82 | HR 71 | Ht 64.0 in | Wt 192.6 lb

## 2020-01-07 DIAGNOSIS — R202 Paresthesia of skin: Secondary | ICD-10-CM

## 2020-01-07 DIAGNOSIS — M79602 Pain in left arm: Secondary | ICD-10-CM

## 2020-01-07 DIAGNOSIS — M25512 Pain in left shoulder: Secondary | ICD-10-CM

## 2020-01-07 DIAGNOSIS — G8929 Other chronic pain: Secondary | ICD-10-CM | POA: Diagnosis not present

## 2020-01-07 NOTE — Progress Notes (Signed)
Subjective:  Penny Rice is a 64 y.o. female who presents for Chief Complaint  Patient presents with  . Arm Pain    left x1-2 months with tingling in fingers sometimes     Here for left arm pain x 1+ month.   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  Is left handed.  Doesn't hurt to touch.  Hurts some sitting still.   No neck pain.  Pain is mostly left shoulder to forearm. Gets some tingling in the fingers at times.   Sometimes pain in right upper arm.  No tingling in right arm.   Using nothing for symptoms.  No other aggravating or relieving factors.    No other c/o.   Past Medical History:  Diagnosis Date  . Allergy   . Arthritis   . Hyperlipidemia   . Hypertension    Past Surgical History:  Procedure Laterality Date  . ABDOMINAL HYSTERECTOMY     pt reports full  . COLONOSCOPY  2016   Dr. Lucio Edward   The following portions of the patient's history were reviewed and updated as appropriate: allergies, current medications, past family history, past medical history, past social history, past surgical history and problem list.  ROS Otherwise as in subjective above  Objective: BP 122/82   Pulse 71   Ht 5\' 4"  (1.626 m)   Wt 192 lb 9.6 oz (87.4 kg)   SpO2 95%   BMI 33.06 kg/m   General appearance: alert, no distress, well developed, well nourished Skin unremarkable Neck nontender, normal range of motion, no mass no thyromegaly Arm is nontender throughout left and right Left arm shoulder range of motion normal, no specific point tenderness, no swelling, she does have significant arthritic bony changes of PIPs throughout both hands but otherwise no abnormality No joint swelling Arms neurovascularly intact    Assessment: Encounter Diagnoses  Name Primary?  . Chronic left shoulder pain Yes  . Left arm pain   . Arm paresthesia, left      Plan: We discussed possible causes.  She may just be sore from musculoskeletal pain with physical labor at work.  Her  symptoms are not specific for cervical radiculopathy or rotator cuff or tendinitis.  She does point to lateral epicondyle on the left that sometimes hurts but this is nontender today.  So she may have some occasional tennis elbow symptoms.  Her exam other than arthritis of the fingers is normal.  We will get some baseline x-rays just to make sure no lucency or obvious arthritic change that may be causing some pain  She can use Aleve or Tylenol over-the-counter as needed short-term, ice or heat therapy as desired.  We discussed relative rest.  Penny Rice was seen today for arm pain.  Diagnoses and all orders for this visit:  Chronic left shoulder pain -     DG Shoulder Left; Future -     DG Humerus Left; Future -     DG Cervical Spine Complete; Future  Left arm pain -     DG Shoulder Left; Future -     DG Humerus Left; Future -     DG Cervical Spine Complete; Future  Arm paresthesia, left -     DG Shoulder Left; Future -     DG Humerus Left; Future -     DG Cervical Spine Complete; Future    Follow up: f/u pending xrays

## 2020-01-13 ENCOUNTER — Other Ambulatory Visit: Payer: Self-pay | Admitting: Medical

## 2020-01-13 DIAGNOSIS — I10 Essential (primary) hypertension: Secondary | ICD-10-CM

## 2020-02-04 ENCOUNTER — Ambulatory Visit (AMBULATORY_SURGERY_CENTER): Payer: Self-pay | Admitting: *Deleted

## 2020-02-04 ENCOUNTER — Other Ambulatory Visit: Payer: Self-pay

## 2020-02-04 VITALS — Ht 64.0 in | Wt 190.0 lb

## 2020-02-04 DIAGNOSIS — Z86010 Personal history of colonic polyps: Secondary | ICD-10-CM

## 2020-02-04 MED ORDER — PLENVU 140 G PO SOLR: 1.0000 | ORAL | 0 refills | Status: DC

## 2020-02-04 NOTE — Progress Notes (Signed)
No egg or soy allergy known to patient  No issues with past sedation with any surgeries or procedures no intubation problems in the past  No FH of Malignant Hyperthermia No diet pills per patient No home 02 use per patient  No blood thinners per patient  Pt denies issues with constipation  No A fib or A flutter  EMMI video to pt or via [place] 19 guidelines implemented in PV today with Pt and RN   cov vax x 2- 12-2019   suprep  Coupon given to pt in PV today , Code to Pharmacy   Due to the COVID-19 pandemic we are asking patients to follow these guidelines. Please only bring one care partner. Please be aware that your care partner may wait in the car in the parking lot or if they feel like they will be too hot to wait in the car, they may wait in the lobby on the 4th floor. All care partners are required to wear a mask the entire time (we do not have any that we can provide them), they need to practice social distancing, and we will do a Covid check for all patient's and care partners when you arrive. Also we will check their temperature and your temperature. If the care partner waits in their car they need to stay in the parking lot the entire time and we will call them on their cell phone when the patient is ready for discharge so they can bring the car to the front of the building. Also all patient's will need to wear a mask into building.

## 2020-02-05 ENCOUNTER — Ambulatory Visit (INDEPENDENT_AMBULATORY_CARE_PROVIDER_SITE_OTHER): Payer: BC Managed Care – PPO | Admitting: Medical

## 2020-02-05 ENCOUNTER — Encounter: Payer: Self-pay | Admitting: Medical

## 2020-02-05 ENCOUNTER — Encounter: Payer: Self-pay | Admitting: Gastroenterology

## 2020-02-05 VITALS — BP 136/80 | HR 68 | Ht 64.0 in | Wt 192.0 lb

## 2020-02-05 DIAGNOSIS — Z780 Asymptomatic menopausal state: Secondary | ICD-10-CM

## 2020-02-05 DIAGNOSIS — Z0000 Encounter for general adult medical examination without abnormal findings: Secondary | ICD-10-CM

## 2020-02-05 DIAGNOSIS — Z1211 Encounter for screening for malignant neoplasm of colon: Secondary | ICD-10-CM | POA: Insufficient documentation

## 2020-02-05 DIAGNOSIS — R7989 Other specified abnormal findings of blood chemistry: Secondary | ICD-10-CM

## 2020-02-05 DIAGNOSIS — E785 Hyperlipidemia, unspecified: Secondary | ICD-10-CM

## 2020-02-05 DIAGNOSIS — Z9119 Patient's noncompliance with other medical treatment and regimen: Secondary | ICD-10-CM

## 2020-02-05 DIAGNOSIS — H9193 Unspecified hearing loss, bilateral: Secondary | ICD-10-CM

## 2020-02-05 DIAGNOSIS — Z91199 Patient's noncompliance with other medical treatment and regimen due to unspecified reason: Secondary | ICD-10-CM

## 2020-02-05 DIAGNOSIS — Z136 Encounter for screening for cardiovascular disorders: Secondary | ICD-10-CM | POA: Insufficient documentation

## 2020-02-05 DIAGNOSIS — Z23 Encounter for immunization: Secondary | ICD-10-CM | POA: Diagnosis not present

## 2020-02-05 DIAGNOSIS — Z7185 Encounter for immunization safety counseling: Secondary | ICD-10-CM

## 2020-02-05 DIAGNOSIS — Z7189 Other specified counseling: Secondary | ICD-10-CM

## 2020-02-05 DIAGNOSIS — E119 Type 2 diabetes mellitus without complications: Secondary | ICD-10-CM | POA: Insufficient documentation

## 2020-02-05 DIAGNOSIS — E1169 Type 2 diabetes mellitus with other specified complication: Secondary | ICD-10-CM | POA: Diagnosis not present

## 2020-02-05 DIAGNOSIS — E559 Vitamin D deficiency, unspecified: Secondary | ICD-10-CM

## 2020-02-05 DIAGNOSIS — R232 Flushing: Secondary | ICD-10-CM | POA: Diagnosis not present

## 2020-02-05 DIAGNOSIS — Z6832 Body mass index (BMI) 32.0-32.9, adult: Secondary | ICD-10-CM

## 2020-02-05 DIAGNOSIS — I10 Essential (primary) hypertension: Secondary | ICD-10-CM | POA: Diagnosis not present

## 2020-02-05 DIAGNOSIS — Q845 Enlarged and hypertrophic nails: Secondary | ICD-10-CM | POA: Insufficient documentation

## 2020-02-05 DIAGNOSIS — Z90710 Acquired absence of both cervix and uterus: Secondary | ICD-10-CM

## 2020-02-05 NOTE — Progress Notes (Signed)
Subjective:   HPI  Penny Rice is a 64 y.o. female who presents for Chief Complaint  Patient presents with  . Annual Exam    with fasting labs     Patient Care Team: Mikeala Girdler, Leward Quan as PCP - General (Family Medicine) Sees dentist Sees eye doctor Dr. Mechele Claude, ortho Dr. Lucio Edward   Concerns: Hypertension- compliant with her medications for blood pressure but only taking carvedilol once daily. ,not twice daily.  She really wants to come off the medicines.  Wants to know if she can only take the medicine daily  Diabetes-she never was able to start [name] her last visit due to insurance denial..  Not checking blood sugars  Hyperlipidemia-takes her Crestor some of the time  She notes some ongoing pains in her right upper abdomen, lower rib area worse with bending to the left, worse after eating.  But not tender to palpitation and if she turns to the left she gets pain.  No trauma or injury.  She has ongoing pains in her left shoulder  She notes when she takes her medicine before she goes to work she gets tired but if she is at home rested and taking her medicine she feels better  She does feel tired at times.  No prior sleep study.  She does snore.  She does not have concern for sleep apnea then.   Reviewed their medical, surgical, family, social, medication, and allergy history and updated chart as appropriate.   Past Medical History:  Diagnosis Date  . Allergy   . Arthritis   . Diabetes mellitus without complication ([place])   . Hyperlipidemia   . Hypertension   . Vitamin D deficiency     Past Surgical History:  Procedure Laterality Date  . ABDOMINAL HYSTERECTOMY     pt reports full  . COLONOSCOPY  2016   Dr. Lucio Edward  . POLYPECTOMY      Family History  Problem Relation Age of Onset  . Hypertension Sister   . Diabetes Sister   . Hypertension Mother   . Other Father        "violent death"  . Cancer Son        lung  . Hypertension  Sister   . Hypertension Sister   . Hypertension Sister   . Hypertension Sister   . Colon cancer Neg Hx   . Heart disease Neg Hx   . Stroke Neg Hx   . Colon polyps Neg Hx   . Rectal cancer Neg Hx   . Stomach cancer Neg Hx      Current Outpatient Medications:  .  amLODipine (NORVASC) 10 MG tablet, TAKE 1 TABLET(10 MG) BY MOUTH DAILY, Disp: 30 tablet, Rfl: 0 .  aspirin EC 81 MG tablet, Take 1 tablet (81 mg total) by mouth daily., Disp: 90 tablet, Rfl: 3 .  carvedilol (COREG) 25 MG tablet, TAKE 1 TABLET(25 MG) BY MOUTH TWICE DAILY, Disp: 180 tablet, Rfl: 3 .  losartan-hydrochlorothiazide (HYZAAR) 100-25 MG tablet, TAKE 1 TABLET BY MOUTH DAILY, Disp: 90 tablet, Rfl: 0 .  rosuvastatin (CRESTOR) 10 MG tablet, TAKE 1 TABLET(10 MG) BY MOUTH AT BEDTIME, Disp: 90 tablet, Rfl: 3  No Known Allergies   Review of Systems Constitutional: -fever, -chills, -sweats, -unexpected weight change, -decreased appetite, -fatigue Allergy: -sneezing, -itching, -congestion Dermatology: -changing moles, --rash, -lumps ENT: -runny nose, -ear pain, -sore throat, -hoarseness, -sinus pain, -teeth pain, - ringing in ears, -hearing loss, -nosebleeds Cardiology: -chest pain, -palpitations, -swelling, -  difficulty breathing when lying flat, -waking up short of breath Respiratory: -cough, -shortness of breath, -difficulty breathing with exercise or exertion, -wheezing, -coughing up blood Gastroenterology: +abdominal pain, -nausea, -vomiting, -diarrhea, -constipation, -blood in stool, -changes in bowel movement, -difficulty swallowing or eating Hematology: -bleeding, -bruising  Musculoskeletal: +joint aches, -muscle aches, -joint swelling, -back pain, -neck pain, -cramping, -changes in gait Ophthalmology: denies vision changes, eye redness, itching, discharge Urology: -burning with urination, -difficulty urinating, -blood in urine, -urinary frequency, -urgency, -incontinence Neurology: -headache, -weakness, -tingling,  -numbness, -memory loss, -falls, -dizziness Psychology: -depressed mood, -agitation, -sleep problems Breast/gyn: -breast tendnerss, -discharge, -lumps, -vaginal discharge,- irregular periods, -heavy periods     Objective:  BP 136/80   Pulse 68   Ht 5\' 4"  (1.626 m)   Wt 192 lb (87.1 kg)   SpO2 96%   BMI 32.96 kg/m   General appearance: alert, no distress, WD/WN, African American female Skin: no worrisome lesions Neck: supple, no lymphadenopathy, no thyromegaly, no masses, normal ROM, no bruits Chest: non tender, normal shape and expansion Heart: RRR, normal S1, S2, no murmurs Lungs: CTA bilaterally, no wheezes, rhonchi, or rales Abdomen: +bs, soft, non tender, non distended, no masses, no hepatomegaly, no splenomegaly, no bruits Back: non tender, normal ROM, no scoliosis Musculoskeletal: upper extremities non tender, no obvious deformity, normal ROM throughout, lower extremities non tender, no obvious deformity, normal ROM throughout Extremities: no edema, no cyanosis, no clubbing Pulses: 2+ symmetric, upper and lower extremities, normal cap refill Neurological: alert, oriented x 3, CN2-12 intact, strength normal upper extremities and lower extremities, sensation normal throughout, DTRs 2+ throughout, no cerebellar signs, gait normal Psychiatric: normal affect, behavior normal, pleasant  Breast/gyn/rectal - deferred  Diabetic Foot Exam - Simple   Simple Foot Form Diabetic Foot exam was performed with the following findings: Yes 02/05/2020  2:58 PM  Visual Inspection See comments: Yes Sensation Testing Intact to touch and monofilament testing bilaterally: Yes Pulse Check Posterior Tibialis and Dorsalis pulse intact bilaterally: Yes Comments Toenail polish present but she has thickened hypertrophic toenails throughout      Assessment and Plan :   Encounter Diagnoses  Name Primary?  . Encounter for health maintenance examination in adult Yes  . Type 2 diabetes mellitus with  other specified complication, without long-term current use of insulin ([place])   . Dyslipidemia associated with type 2 diabetes mellitus ([place])   . Essential hypertension   . Hot flashes   . BMI 32.0-32.9,adult   . Hyperlipidemia, unspecified hyperlipidemia type   . Abnormal thyroid blood test   . Vitamin D deficiency   . Decreased hearing of both ears   . S/P hysterectomy   . Vaccine counseling   . Post-menopausal   . Screen for colon cancer   . Need for influenza vaccination   . Screening for heart disease   . Enlarged and hypertrophic nails   . Noncompliance     Physical exam - discussed and counseled on healthy lifestyle, diet, exercise, preventative care, vaccinations, sick and well care, proper use of emergency dept and after hours care, and addressed their concerns.    Cancer screening: I reviewed her March 2021 mammogram that was normal  I reviewed her May 2016 colonoscopy by Dr. Lucio Edward showing sessile polyp in the transverse colon, 2 sessile polyps in the sigmoid colon, grade 1 internal hemorrhoids.  She was advised to repeat in 5 years if adenoma.  Pathology did show tubular adenoma.  That she is due this year 2021  She  is status post hysterectomy.  No Pap needed   Vaccinations Counseled on yearly flu shot, shingles shot, Covid vaccine, tetanus vaccine  She is up-to-date on pneumococcal 23. She is up to date Covid   Chronic issues Hypertension-continue amlodipine 10 mg daily, losartan HCT 100/25 mg daily, and increase to Coreg 25 mg twice daily.  She has not been compliant with Coreg.  We discussed that in reality she probably cannot come off most of her blood pressure medications.  I did encourage her to lose weight to eat a low-fat Mediterranean diet, and to consider sleep study  Hyperlipidemia-continue Crestor 10 mg daily, aspirin 81 mg daily  Diabetes type 2-from last visit [name] was not approved.  She declined to go on any other medicine last year and  has not been back for diabetes follow-up.  Recheck labs today.  Discussed importance of compliance, follow-up visits, labs, prevention of complication  Vitamin D deficiency-advised vitamin D supplement  Arthritis-sees orthopedics  Abdominal discomfort-likely related to bloating but unclear etiology  She will return on separate visit for breast and pelvic exam  [place] was seen today for annual exam.  Diagnoses and all orders for this visit:  Encounter for health maintenance examination in adult -     Comprehensive metabolic panel -     CBC with Differential/Platelet -     Lipid panel -     High sensitivity CRP -     Hemoglobin A1c -     Microalbumin/Creatinine Ratio, Urine -     TSH  Type 2 diabetes mellitus with other specified complication, without long-term current use of insulin ([facility]) -     Hemoglobin A1c -     Microalbumin/Creatinine Ratio, Urine  Dyslipidemia associated with type 2 diabetes mellitus ([place])  Essential hypertension  Hot flashes  BMI 32.0-32.9,adult  Hyperlipidemia, unspecified hyperlipidemia type -     Lipid panel  Abnormal thyroid blood test -     TSH  Vitamin D deficiency  Decreased hearing of both ears  S/P hysterectomy  Vaccine counseling  Post-menopausal  Screen for colon cancer  Need for influenza vaccination  Screening for heart disease -     High sensitivity CRP  Enlarged and hypertrophic nails  Noncompliance  Other orders -     Flu Vaccine QUAD 6+ mos PF IM (Fluarix Quad PF)    Follow-up pending labs, yearly for physical

## 2020-02-06 LAB — CBC WITH DIFFERENTIAL/PLATELET
Basophils Absolute: 0 10*3/uL (ref 0.0–0.2)
Basos: 1 %
EOS (ABSOLUTE): 0.2 10*3/uL (ref 0.0–0.4)
Eos: 2 %
Hematocrit: 36.5 % (ref 34.0–46.6)
Hemoglobin: 12.3 g/dL (ref 11.1–15.9)
Immature Grans (Abs): 0 10*3/uL (ref 0.0–0.1)
Immature Granulocytes: 0 %
Lymphocytes Absolute: 1.8 10*3/uL (ref 0.7–3.1)
Lymphs: 23 %
MCH: 29.6 pg (ref 26.6–33.0)
MCHC: 33.7 g/dL (ref 31.5–35.7)
MCV: 88 fL (ref 79–97)
Monocytes Absolute: 0.5 10*3/uL (ref 0.1–0.9)
Monocytes: 6 %
Neutrophils Absolute: 5.5 10*3/uL (ref 1.4–7.0)
Neutrophils: 68 %
Platelets: 262 10*3/uL (ref 150–450)
RBC: 4.15 x10E6/uL (ref 3.77–5.28)
RDW: 13.1 % (ref 11.7–15.4)
WBC: 8.1 10*3/uL (ref 3.4–10.8)

## 2020-02-06 LAB — COMPREHENSIVE METABOLIC PANEL
ALT: 18 IU/L (ref 0–32)
AST: 14 IU/L (ref 0–40)
Albumin/Globulin Ratio: 1.8 (ref 1.2–2.2)
Albumin: 4.4 g/dL (ref 3.8–4.8)
Alkaline Phosphatase: 80 IU/L (ref 48–121)
BUN/Creatinine Ratio: 19 (ref 12–28)
BUN: 19 mg/dL (ref 8–27)
Bilirubin Total: 0.4 mg/dL (ref 0.0–1.2)
CO2: 26 mmol/L (ref 20–29)
Calcium: 10.1 mg/dL (ref 8.7–10.3)
Chloride: 101 mmol/L (ref 96–106)
Creatinine, Ser: 1.01 mg/dL — ABNORMAL HIGH (ref 0.57–1.00)
GFR calc Af Amer: 69 mL/min/{1.73_m2} (ref >59)
GFR calc non Af Amer: 60 mL/min/{1.73_m2} (ref >59)
Globulin, Total: 2.5 g/dL (ref 1.5–4.5)
Glucose: 137 mg/dL — ABNORMAL HIGH (ref 65–99)
Potassium: 3.5 mmol/L (ref 3.5–5.2)
Sodium: 141 mmol/L (ref 134–144)
Total Protein: 6.9 g/dL (ref 6.0–8.5)

## 2020-02-06 LAB — MICROALBUMIN / CREATININE URINE RATIO
Creatinine, Urine: 119.8 mg/dL
Microalb/Creat Ratio: 3 mg/g creat (ref 0–29)
Microalbumin, Urine: 3.5 ug/mL

## 2020-02-06 LAB — LIPID PANEL
Chol/HDL Ratio: 4.1 ratio (ref 0.0–4.4)
Cholesterol, Total: 186 mg/dL (ref 100–199)
HDL: 45 mg/dL (ref >39)
LDL Chol Calc (NIH): 122 mg/dL — ABNORMAL HIGH (ref 0–99)
Triglycerides: 107 mg/dL (ref 0–149)
VLDL Cholesterol Cal: 19 mg/dL (ref 5–40)

## 2020-02-06 LAB — HEMOGLOBIN A1C
Est. average glucose Bld gHb Est-mCnc: 169 mg/dL
Hgb A1c MFr Bld: 7.5 % — ABNORMAL HIGH (ref 4.8–5.6)

## 2020-02-06 LAB — HIGH SENSITIVITY CRP: CRP, High Sensitivity: 4.57 mg/L — ABNORMAL HIGH (ref 0.00–3.00)

## 2020-02-06 LAB — TSH: TSH: 0.498 u[IU]/mL (ref 0.450–4.500)

## 2020-02-17 ENCOUNTER — Other Ambulatory Visit: Payer: Self-pay | Admitting: Medical

## 2020-02-17 MED ORDER — ROSUVASTATIN CALCIUM 10 MG PO TABS: ORAL_TABLET | ORAL | 3 refills | Status: DC

## 2020-02-17 MED ORDER — OLMESARTAN-AMLODIPINE-HCTZ 40-10-25 MG PO TABS: 1.0000 | ORAL_TABLET | Freq: Every day | ORAL | 1 refills | Status: DC

## 2020-02-17 MED ORDER — ASPIRIN EC 81 MG PO TBEC: 81.0000 mg | DELAYED_RELEASE_TABLET | Freq: Every day | ORAL | 3 refills | Status: DC

## 2020-02-17 MED ORDER — METFORMIN HCL 500 MG PO TABS: 500.0000 mg | ORAL_TABLET | Freq: Every day | ORAL | 1 refills | Status: DC

## 2020-02-18 ENCOUNTER — Other Ambulatory Visit: Payer: Self-pay

## 2020-02-18 ENCOUNTER — Encounter: Payer: Self-pay | Admitting: Gastroenterology

## 2020-02-18 ENCOUNTER — Ambulatory Visit (AMBULATORY_SURGERY_CENTER): Payer: BC Managed Care – PPO | Admitting: Gastroenterology

## 2020-02-18 VITALS — BP 135/77 | HR 61 | Temp 97.7°F | Resp 12 | Ht 64.0 in | Wt 190.0 lb

## 2020-02-18 DIAGNOSIS — D124 Benign neoplasm of descending colon: Secondary | ICD-10-CM

## 2020-02-18 DIAGNOSIS — Z1211 Encounter for screening for malignant neoplasm of colon: Secondary | ICD-10-CM | POA: Diagnosis not present

## 2020-02-18 DIAGNOSIS — Z86010 Personal history of colonic polyps: Secondary | ICD-10-CM | POA: Diagnosis not present

## 2020-02-18 DIAGNOSIS — D125 Benign neoplasm of sigmoid colon: Secondary | ICD-10-CM

## 2020-02-18 DIAGNOSIS — D123 Benign neoplasm of transverse colon: Secondary | ICD-10-CM | POA: Diagnosis not present

## 2020-02-18 HISTORY — PX: COLONOSCOPY: SHX174

## 2020-02-18 MED ORDER — SODIUM CHLORIDE 0.9 % IV SOLN: 500.0000 mL | Freq: Once | INTRAVENOUS | Status: DC

## 2020-02-18 NOTE — Progress Notes (Signed)
Called to room to assist during endoscopic procedure.  Patient ID and intended procedure confirmed with present staff. Received instructions for my participation in the procedure from the performing physician.  

## 2020-02-18 NOTE — Patient Instructions (Signed)
Handout given for polyps.  Await pathology results.  YOU HAD AN ENDOSCOPIC PROCEDURE TODAY AT THE [facility]:   Refer to the procedure report that was given to you for any specific questions about what was found during the examination.  If the procedure report does not answer your questions, please call your gastroenterologist to clarify.  If you requested that your care partner not be given the details of your procedure findings, then the procedure report has been included in a sealed envelope for you to review at your convenience later.  YOU SHOULD EXPECT: Some feelings of bloating in the abdomen. Passage of more gas than usual.  Walking can help get rid of the air that was put into your GI tract during the procedure and reduce the bloating. If you had a lower endoscopy (such as a colonoscopy or flexible sigmoidoscopy) you may notice spotting of blood in your stool or on the toilet paper. If you underwent a bowel prep for your procedure, you may not have a normal bowel movement for a few days.  Please Note:  You might notice some irritation and congestion in your nose or some drainage.  This is from the oxygen used during your procedure.  There is no need for concern and it should clear up in a day or so.  SYMPTOMS TO REPORT IMMEDIATELY:   Following lower endoscopy (colonoscopy or flexible sigmoidoscopy):  Excessive amounts of blood in the stool  Significant tenderness or worsening of abdominal pains  Swelling of the abdomen that is new, acute  Fever of 100F or higher  For urgent or emergent issues, a gastroenterologist can be reached at any hour by calling [phone]. Do not use MyChart messaging for urgent concerns.    DIET:  We do recommend a small meal at first, but then you may proceed to your regular diet.  Drink plenty of fluids but you should avoid alcoholic beverages for 24 hours.  ACTIVITY:  You should plan to take it easy for the rest of today and you should  NOT DRIVE or use heavy machinery until tomorrow (because of the sedation medicines used during the test).    FOLLOW UP: Our staff will call the number listed on your records 48-72 hours following your procedure to check on you and address any questions or concerns that you may have regarding the information given to you following your procedure. If we do not reach you, we will leave a message.  We will attempt to reach you two times.  During this call, we will ask if you have developed any symptoms of COVID 19. If you develop any symptoms (ie: fever, flu-like symptoms, shortness of breath, cough etc.) before then, please call [phone].  If you test positive for Covid 19 in the 2 weeks post procedure, please call and report this information to us.    If any biopsies were taken you will be contacted by phone or by letter within the next 1-3 weeks.  Please call us at [phone] if you have not heard about the biopsies in 3 weeks.    SIGNATURES/CONFIDENTIALITY: You and/or your care partner have signed paperwork which will be entered into your electronic medical record.  These signatures attest to the fact that that the information above on your After Visit Summary has been reviewed and is understood.  Full responsibility of the confidentiality of this discharge information lies with you and/or your care-partner. 

## 2020-02-18 NOTE — Progress Notes (Signed)
Pt's states no medical or surgical changes since previsit or office visit.  Vitals AB

## 2020-02-18 NOTE — Progress Notes (Signed)
PT taken to PACU. Monitors in place. VSS. Report given to RN. 

## 2020-02-18 NOTE — Op Note (Addendum)
[place] Patient Name: Penny Rice Procedure Date: 02/18/2020 1:28 PM MRN: [id number] Endoscopist: Ladene Artist , MD Age: 64 Referring MD:  Date of Birth: Mar 12, 1956 Gender: Female Account #: [id number] Procedure:                Colonoscopy Indications:              Surveillance: Personal history of adenomatous                            polyps on last colonoscopy 5 years ago Medicines:                Monitored Anesthesia Care Procedure:                Pre-Anesthesia Assessment:                           - Prior to the procedure, a History and Physical                            was performed, and patient medications and                            allergies were reviewed. The patient's tolerance of                            previous anesthesia was also reviewed. The risks                            and benefits of the procedure and the sedation                            options and risks were discussed with the patient.                            All questions were answered, and informed consent                            was obtained. Prior Anticoagulants: The patient has                            taken no previous anticoagulant or antiplatelet                            agents. ASA Grade Assessment: II - A patient with                            mild systemic disease. After reviewing the risks                            and benefits, the patient was deemed in                            satisfactory condition to undergo the procedure.  After obtaining informed consent, the colonoscope                            was passed under direct vision. Throughout the                            procedure, the patient's blood pressure, pulse, and                            oxygen saturations were monitored continuously. The                            Colonoscope was introduced through the anus and                            advanced to the the cecum,  identified by                            appendiceal orifice and ileocecal valve. The                            ileocecal valve, appendiceal orifice, and rectum                            were photographed. The quality of the bowel                            preparation was excellent. The colonoscopy was                            performed without difficulty. The patient tolerated                            the procedure well. Scope In: 1:41:28 PM Scope Out: 1:56:13 PM Scope Withdrawal Time: 0 hours 12 minutes 30 seconds  Total Procedure Duration: 0 hours 14 minutes 45 seconds  Findings:                 The perianal and digital rectal examinations were                            normal.                           Three sessile polyps were found in the sigmoid                            colon, descending colon and transverse colon. The                            polyps were 6 to 8 mm in size. These polyps were                            removed with a cold snare. Resection and retrieval  were complete.                           The exam was otherwise without abnormality on                            direct and retroflexion views. Complications:            No immediate complications. Estimated blood loss:                            None. Estimated Blood Loss:     Estimated blood loss: none. Impression:               - Three 6 to 8 mm polyps in the sigmoid colon, in                            the descending colon and in the transverse colon,                            removed with a cold snare. Resected and retrieved.                           - The examination was otherwise normal on direct                            and retroflexion views. Recommendation:           - Repeat colonoscopy date to be determined after                            pending pathology results are reviewed for                            surveillance based on pathology results.                            - Patient has a contact number available for                            emergencies. The signs and symptoms of potential                            delayed complications were discussed with the                            patient. Return to normal activities tomorrow.                            Written discharge instructions were provided to the                            patient.                           - Resume previous diet.                           -  Continue present medications.                           - Await pathology results. Ladene Artist, MD 02/18/2020 1:59:22 PM This report has been signed electronically.

## 2020-02-20 ENCOUNTER — Telehealth: Payer: Self-pay

## 2020-02-20 NOTE — Telephone Encounter (Signed)
  Follow up Call-  Call back number 02/18/2020  Post procedure Call Back phone  # [phone]  Permission to leave phone message Yes  Some recent data might be hidden     Patient questions:  Do you have a fever, pain , or abdominal swelling? No. Pain Score  0 *  Have you tolerated food without any problems? Yes.    Have you been able to return to your normal activities? Yes.    Do you have any questions about your discharge instructions: Diet   No. Medications  No. Follow up visit  No.  Do you have questions or concerns about your Care? No.  Actions: * If pain score is 4 or above: No action needed, pain <4.  Have you developed a fever since your procedure? No 2.   Have you had an respiratory symptoms (SOB or cough) since your procedure? No  3.   Have you tested positive for COVID 19 since your procedure No 4.   Have you had any family members/close contacts diagnosed with the COVID 19 since your procedure?  No   If yes to any of these questions please route to Joylene John, RN and Joella Prince, RN

## 2020-02-27 ENCOUNTER — Encounter: Payer: Self-pay | Admitting: Gastroenterology

## 2020-03-11 ENCOUNTER — Ambulatory Visit (INDEPENDENT_AMBULATORY_CARE_PROVIDER_SITE_OTHER): Payer: BC Managed Care – PPO | Admitting: Medical

## 2020-03-11 ENCOUNTER — Encounter: Payer: Self-pay | Admitting: Medical

## 2020-03-11 ENCOUNTER — Other Ambulatory Visit: Payer: Self-pay | Admitting: Medical

## 2020-03-11 ENCOUNTER — Other Ambulatory Visit: Payer: Self-pay

## 2020-03-11 VITALS — BP 136/92 | HR 79 | Ht 64.0 in | Wt 190.4 lb

## 2020-03-11 DIAGNOSIS — E559 Vitamin D deficiency, unspecified: Secondary | ICD-10-CM

## 2020-03-11 DIAGNOSIS — I10 Essential (primary) hypertension: Secondary | ICD-10-CM

## 2020-03-11 DIAGNOSIS — M778 Other enthesopathies, not elsewhere classified: Secondary | ICD-10-CM

## 2020-03-11 DIAGNOSIS — R109 Unspecified abdominal pain: Secondary | ICD-10-CM | POA: Diagnosis not present

## 2020-03-11 DIAGNOSIS — R10A0 Flank pain, unspecified side: Secondary | ICD-10-CM

## 2020-03-11 DIAGNOSIS — E785 Hyperlipidemia, unspecified: Secondary | ICD-10-CM

## 2020-03-11 DIAGNOSIS — K5900 Constipation, unspecified: Secondary | ICD-10-CM

## 2020-03-11 MED ORDER — POLYETHYLENE GLYCOL 3350 17 GM/SCOOP PO POWD: 17.0000 g | Freq: Two times a day (BID) | ORAL | 0 refills | Status: DC | PRN

## 2020-03-11 NOTE — Progress Notes (Signed)
Established patient visit   Patient: Penny Rice   DOB: June 19, 1955   64 y.o. Female  MRN: [id number] Visit Date: 03/11/2020  Today's healthcare provider: Dorothea Ogle, PA-C   Chief Complaint  Patient presents with  . Flank Pain    rigth side   . Arm Pain    fold of arm left side  I,Shane Tysinger,acting as a Education administrator for [employer], PA-C.,have documented all relevant documentation on the behalf of Dorothea Ogle, PA-C,as directed by  [employer], PA-C while in the presence of [employer], PA-C.  Subjective    HPI HPI    Flank Pain     Additional comments: rigth side           Arm Pain     Additional comments: fold of arm left side       Last edited by Edgar Frisk, CMA on 03/11/2020  3:42 PM. (History)      Flank Pain Right side pain and when she lay on right side it the pain moves to the left side. Patient reports she does not have regular bowel movements. Sometimes want have a bowel movement more than 2 days. Lay down hurts more in the middle that will rotated to back. Sometimes pain in right shoulder. Patient had exam in office and denies any tenderness to the abdomen area.   Arm Pain Patient reports right arm pain when sitting down at time and her left arm hurts when lifting the arm at head level. Patient denies any pain during physical exam in office.  Hypertension Patient was still refilling Amlodipine 10 mg and pharmacy was contacted to discontinue the medication due to change in therapy. Patient does not check bp regularly.  Medications: Outpatient Medications Prior to Visit  Medication Sig  . carvedilol (COREG) 25 MG tablet TAKE 1 TABLET(25 MG) BY MOUTH TWICE DAILY  . metFORMIN (GLUCOPHAGE) 500 MG tablet Take 1 tablet (500 mg total) by mouth daily with breakfast.  . Olmesartan-amLODIPine-HCTZ (TRIBENZOR) 40-10-25 MG TABS Take 1 tablet by mouth daily.  . rosuvastatin (CRESTOR) 10 MG tablet TAKE 1 TABLET(10 MG) BY MOUTH AT BEDTIME  . VITAMIN D,  CHOLECALCIFEROL, PO Take by mouth.  Marland Kitchen aspirin EC 81 MG tablet Take 1 tablet (81 mg total) by mouth daily.  . [DISCONTINUED] amLODipine (NORVASC) 10 MG tablet Take 10 mg by mouth daily. (Patient not taking: Reported on 03/11/2020)   No facility-administered medications prior to visit.    Review of Systems    Objective    BP (!) 136/92   Pulse 79   Ht 5\' 4"  (1.626 m)   Wt 190 lb 6.4 oz (86.4 kg)   SpO2 95%   BMI 32.68 kg/m    BP Readings from Last 3 Encounters:  03/11/20 (!) 136/92  02/18/20 135/77  02/05/20 136/80     Physical Exam Constitutional:      Appearance: Normal appearance.  Cardiovascular:     Rate and Rhythm: Normal rate and regular rhythm.     Pulses: Normal pulses.  Abdominal:     General: Abdomen is flat. Bowel sounds are normal. There is no distension.     Palpations: Abdomen is soft. There is no mass.     Tenderness: There is no abdominal tenderness. There is no right CVA tenderness, left CVA tenderness, guarding or rebound.     Hernia: No hernia is present.  Musculoskeletal:     Cervical back: No tenderness.     Thoracic back: No tenderness.  Lumbar back: No tenderness.     Comments: Back nontneder   Skin:    General: Skin is warm and dry.  Neurological:     General: No focal deficit present.     Mental Status: She is alert and oriented to person, place, and time.  Psychiatric:        Mood and Affect: Mood normal.        Behavior: Behavior normal.        Assessment & Plan   Encounter Diagnoses  Name Primary?  . Abdominal pain, unspecified abdominal location Yes  . Flank pain   . Right sided abdominal pain   . Tendonitis of both elbows   . Primary hypertension   . Hyperlipidemia, unspecified hyperlipidemia type   . Vitamin D deficiency   . Constipation, unspecified constipation type         High blood pressure/hypertension  We recently switched you to olmesartan/amlodipine/HCTZ 40/10/25 mg, 1 tablet daily.  Continue this blood  pressure medicine.  This medicine is also known as Tribenzor  Continue Coreg/carvedilol 25 mg twice daily for blood pressure  You are no longer on separate amlodipine so please discontinue the separate amlodipine  High cholesterol, prevention for heart disease  You should be taking a 81 mg baby aspirin daily  You should also be taking rosuvastatin/ Crestor 10 mg daily  Diabetes/impaired glucose  We should be taking Metformin 500 mg tablet daily   Vitamin D deficiency  Continue vitamin D 1000 units daily over-the-counter or prescription but not both    Abdominal pain  We are going to refer you for CT abdomen to further evaluate this concern  Avoid fatty and greasy foods for now  If you have more of a central belly pain or heartburn then begin over-the-counter Nexium or Prilosec daily for 2 weeks, or call me and I will call out a prescription version  I suspect gastritis versus acid reflux versus gallbladder disease or other   Your arm pain is suggestive of tendinitis through repetitive use of your arms at work or other activity  Treatment includes rest, stretching, use of arm sling or splint, Tylenol over-the-counter   Constipation   Continue good water and fiber intake  Begin samples of Miralax as needed       Artemisa was seen today for flank pain and arm pain.  Diagnoses and all orders for this visit:  Abdominal pain, unspecified abdominal location -     CT ABDOMEN W CONTRAST; Future  Flank pain -     CT ABDOMEN W CONTRAST; Future  Right sided abdominal pain  Tendonitis of both elbows  Primary hypertension  Hyperlipidemia, unspecified hyperlipidemia type  Vitamin D deficiency  Constipation, unspecified constipation type  Other orders -     polyethylene glycol powder (GLYCOLAX/MIRALAX) 17 GM/SCOOP powder; Take 17 g by mouth 2 (two) times daily as needed.    Dorothea Ogle, PA-C  [place] [phone] (phone) [phone]  (fax)  [place]

## 2020-03-11 NOTE — Patient Instructions (Addendum)
Recommendations:  High blood pressure/hypertension  We recently switched you to olmesartan/amlodipine/HCTZ 40/10/25 mg, 1 tablet daily.  Continue this blood pressure medicine.  This medicine is also known as Tribenzor  Continue Coreg/carvedilol 25 mg twice daily for blood pressure  You are no longer on separate amlodipine so please discontinue the separate amlodipine   High cholesterol, prevention for heart disease  You should be taking a 81 mg baby aspirin daily  You should also be taking rosuvastatin/ Crestor 10 mg daily   Diabetes/impaired glucose  We should be taking Metformin 500 mg tablet daily   Vitamin D deficiency  Continue vitamin D 1000 units daily over-the-counter or prescription but not both   Abdominal pain  We are going to refer you for CT abdomen to further evaluate this concern  Avoid fatty and greasy foods for now  If you have more of a central belly pain or heartburn then begin over-the-counter Nexium or Prilosec daily for 2 weeks, or call me and I will call out a prescription version  I suspect gastritis versus acid reflux versus gallbladder disease or other   Your arm pain is suggestive of tendinitis through repetitive use of your arms at work or other activity  Treatment includes rest, stretching, use of arm sling or splint, Tylenol over-the-counter   Constipation   Continue good water and fiber intake  Begin samples of Miralax as needed

## 2020-03-20 ENCOUNTER — Telehealth: Payer: Self-pay

## 2020-03-20 NOTE — Telephone Encounter (Signed)
Patient informed. 

## 2020-03-20 NOTE — Telephone Encounter (Signed)
Pt. Called stating that you recently changed some of her medications and they are making her stomach upset when she takes them in the morning she is not sure which one is but one of them she takes in the a.m. is hurting her stomach, maybe the Metformin.

## 2020-03-20 NOTE — Telephone Encounter (Signed)
Continue plan for scan of abdomen, CT ordered  For now she wants to take one half dose of Metformin for the next 2 days to see if the symptoms improve I think that is okay  I would like her to call back to let us know next week if that helps

## 2020-03-24 ENCOUNTER — Telehealth: Payer: Self-pay | Admitting: Medical

## 2020-03-24 NOTE — Telephone Encounter (Signed)
Santiago Glad from [facility] called and said pts order went to peer to peer review and you will need to contact them at [phone].

## 2020-03-30 ENCOUNTER — Other Ambulatory Visit: Payer: Self-pay

## 2020-03-30 ENCOUNTER — Ambulatory Visit
Admission: RE | Admit: 2020-03-30 | Discharge: 2020-03-30 | Disposition: A | Discharge status: Home / Self Care | Payer: BC Managed Care – PPO | Source: Ambulatory Visit | Attending: Medical | Admitting: Medical

## 2020-03-30 DIAGNOSIS — R109 Unspecified abdominal pain: Secondary | ICD-10-CM

## 2020-03-30 DIAGNOSIS — K3189 Other diseases of stomach and duodenum: Secondary | ICD-10-CM | POA: Diagnosis not present

## 2020-03-30 DIAGNOSIS — I700 Atherosclerosis of aorta: Secondary | ICD-10-CM | POA: Diagnosis not present

## 2020-03-30 DIAGNOSIS — G9589 Other specified diseases of spinal cord: Secondary | ICD-10-CM | POA: Diagnosis not present

## 2020-03-30 MED ORDER — IOPAMIDOL (ISOVUE-300) INJECTION 61%
100.0000 mL | Freq: Once | INTRAVENOUS | Status: AC | PRN
  Administered 2020-03-30: 100 mL via INTRAVENOUS

## 2020-05-06 LAB — HM DIABETES EYE EXAM

## 2020-05-13 ENCOUNTER — Encounter: Payer: Self-pay | Admitting: Medical

## 2020-05-14 ENCOUNTER — Encounter: Payer: Self-pay | Admitting: Medical

## 2020-06-06 HISTORY — PX: TOOTH EXTRACTION: SUR596

## 2020-06-12 ENCOUNTER — Other Ambulatory Visit: Payer: Self-pay | Admitting: Medical

## 2020-06-12 DIAGNOSIS — I10 Essential (primary) hypertension: Secondary | ICD-10-CM

## 2020-06-13 ENCOUNTER — Other Ambulatory Visit: Payer: BC Managed Care – PPO

## 2020-06-13 ENCOUNTER — Other Ambulatory Visit: Payer: Self-pay

## 2020-06-13 DIAGNOSIS — Z20822 Contact with and (suspected) exposure to covid-19: Secondary | ICD-10-CM | POA: Diagnosis not present

## 2020-06-16 LAB — NOVEL CORONAVIRUS, NAA: SARS-CoV-2, NAA: NOT DETECTED

## 2020-06-29 ENCOUNTER — Ambulatory Visit (INDEPENDENT_AMBULATORY_CARE_PROVIDER_SITE_OTHER): Payer: BC Managed Care – PPO | Admitting: Medical

## 2020-06-29 ENCOUNTER — Encounter: Payer: Self-pay | Admitting: Medical

## 2020-06-29 ENCOUNTER — Other Ambulatory Visit: Payer: Self-pay

## 2020-06-29 VITALS — BP 192/100 | HR 73 | Ht 64.0 in | Wt 189.6 lb

## 2020-06-29 DIAGNOSIS — K5900 Constipation, unspecified: Secondary | ICD-10-CM | POA: Diagnosis not present

## 2020-06-29 DIAGNOSIS — R5383 Other fatigue: Secondary | ICD-10-CM | POA: Diagnosis not present

## 2020-06-29 DIAGNOSIS — R531 Weakness: Secondary | ICD-10-CM | POA: Insufficient documentation

## 2020-06-29 DIAGNOSIS — Z91199 Patient's noncompliance with other medical treatment and regimen due to unspecified reason: Secondary | ICD-10-CM

## 2020-06-29 DIAGNOSIS — R0789 Other chest pain: Secondary | ICD-10-CM

## 2020-06-29 DIAGNOSIS — E785 Hyperlipidemia, unspecified: Secondary | ICD-10-CM

## 2020-06-29 DIAGNOSIS — E559 Vitamin D deficiency, unspecified: Secondary | ICD-10-CM

## 2020-06-29 DIAGNOSIS — R42 Dizziness and giddiness: Secondary | ICD-10-CM | POA: Diagnosis not present

## 2020-06-29 DIAGNOSIS — E1169 Type 2 diabetes mellitus with other specified complication: Secondary | ICD-10-CM

## 2020-06-29 DIAGNOSIS — Z9119 Patient's noncompliance with other medical treatment and regimen: Secondary | ICD-10-CM

## 2020-06-29 DIAGNOSIS — R109 Unspecified abdominal pain: Secondary | ICD-10-CM

## 2020-06-29 DIAGNOSIS — I10 Essential (primary) hypertension: Secondary | ICD-10-CM

## 2020-06-29 MED ORDER — MECLIZINE HCL 25 MG PO TABS: 25.0000 mg | ORAL_TABLET | Freq: Two times a day (BID) | ORAL | 0 refills | Status: DC

## 2020-06-29 NOTE — Progress Notes (Signed)
Referral has been sent.

## 2020-06-29 NOTE — Progress Notes (Signed)
Subjective:  Penny Rice is a 65 y.o. female who presents for Chief Complaint  Patient presents with  . Medication Management    Discuss meds      Here for medication concerns  She thinks her medication is throwing her off balance.  Gets dizzy at times.  Sometimes feels like room spinning.  Other times feels lightheaded.   Feels over all fatigued at times.   Gets occasional right side palpations.    Last visit we discussed pain in right side.  Still ongoing despite scan (CT abdomen) showing nothing of concern other than questionable vertebral lesion.    Having trouble going to bathroom, constipation.  Hasn't had BM in 3 days  Not using miralax daily but occasional  Feels like she doesn't tolerate her BP and diabetes medications.     No headache, no paresthesias, no fall, no confusion, no urinary problems.    Not checking BP or glucose regularly although she has glucometer.  No other aggravating or relieving factors.    No other c/o.  Past Medical History:  Diagnosis Date  . Allergy   . Arthritis   . Diabetes mellitus without complication ([place])   . Hyperlipidemia   . Hypertension   . Vitamin D deficiency    Current Outpatient Medications on File Prior to Visit  Medication Sig Dispense Refill  . aspirin EC 81 MG tablet Take 1 tablet (81 mg total) by mouth daily. 90 tablet 3  . carvedilol (COREG) 25 MG tablet TAKE 1 TABLET(25 MG) BY MOUTH TWICE DAILY 180 tablet 0  . metFORMIN (GLUCOPHAGE) 500 MG tablet Take 1 tablet (500 mg total) by mouth daily with breakfast. (Patient taking differently: Take 500 mg by mouth daily with breakfast. Patient taking 1/2 daily) 90 tablet 1  . Olmesartan-amLODIPine-HCTZ (TRIBENZOR) 40-10-25 MG TABS Take 1 tablet by mouth daily. 90 tablet 1  . polyethylene glycol powder (GLYCOLAX/MIRALAX) 17 GM/SCOOP powder Take 17 g by mouth 2 (two) times daily as needed. 116 g 0  . rosuvastatin (CRESTOR) 10 MG tablet TAKE 1 TABLET(10 MG) BY MOUTH AT BEDTIME 90 tablet  3  . VITAMIN D, CHOLECALCIFEROL, PO Take by mouth.     No current facility-administered medications on file prior to visit.   Family History  Problem Relation Age of Onset  . Hypertension Sister   . Diabetes Sister   . Hypertension Mother   . Other Father        "violent death"  . Cancer Son        lung  . Hypertension Sister   . Hypertension Sister   . Hypertension Sister   . Hypertension Sister   . Colon cancer Neg Hx   . Heart disease Neg Hx   . Stroke Neg Hx   . Colon polyps Neg Hx   . Rectal cancer Neg Hx   . Stomach cancer Neg Hx    The following portions of the patient's history were reviewed and updated as appropriate: allergies, current medications, past family history, past medical history, past social history, past surgical history and problem list.  ROS Otherwise as in subjective above  Objective: BP (!) 192/100   Pulse 73   Ht 5\' 4"  (1.626 m)   Wt 189 lb 9.6 oz (86 kg)   SpO2 97%   BMI 32.54 kg/m    BP Readings from Last 3 Encounters:  06/29/20 (!) 192/100  03/11/20 (!) 136/92  02/18/20 135/77   Wt Readings from Last 3 Encounters:  06/29/20 189  lb 9.6 oz (86 kg)  03/11/20 190 lb 6.4 oz (86.4 kg)  02/18/20 190 lb (86.2 kg)   General appearance: alert, no distress, well developed, well nourished HEENT: normocephalic, sclerae anicteric, conjunctiva pink and moist, TMs pearly, nares patent, no discharge or erythema, pharynx normal Oral cavity: MMM, no lesions Neck: supple, no lymphadenopathy, no thyromegaly, no masses, no JVD or bruit Heart: RRR, normal S1, S2, no murmurs Lungs: CTA bilaterally, no wheezes, rhonchi, or rales Abdomen: +bs, soft, non tender, non distended, no masses, no hepatomegaly, no splenomegaly Pulses: 2+ radial pulses, 2+ pedal pulses, normal cap refill Ext: no edema   Assessment: Encounter Diagnoses  Name Primary?  . Fatigue, unspecified type Yes  . Constipation, unspecified constipation type   . Right-sided chest wall  pain   . Dizziness   . Weak   . Primary hypertension   . Dyslipidemia associated with type 2 diabetes mellitus ([place])   . Type 2 diabetes mellitus with other specified complication, without long-term current use of insulin ([place])   . Vitamin D deficiency   . Right sided abdominal pain   . Noncompliance   . Hyperlipidemia, unspecified hyperlipidemia type      Plan: Discussed her concerns, medications.  She has had some issues with noncompliance on treatment and home monitoring of BP and glucose.  Discussed the following recommendations in detail, gave summary as well to send home with her   Dizziness  This can be caused by numerous issues  Dizziness can be related to inner ear problems  Dizziness can be related to abnormal rhythm of the heart  Dizziness can be related to low blood sugar  Dizziness can be related to dehydration  So I recommend you drink 80-100 units of fluids such as water daily  I recommend you check your blood sugars and blood pressures as below  Begin Meclizine as needed or tablet for vertigo/dizziness  Palpitations of the heart and high blood pressure  We will refer you to a cardiologist for further evaluation   High blood pressure  I would like you to check your blood pressure readings 3 days/week so you and I both know what you are running at home  Goal is 120/70.  Borderline is 130/80.  High blood pressure is anything over 140/90  BP Readings from Last 3 Encounters:  06/29/20 (!) 192/100  03/11/20 (!) 136/92  02/18/20 135/77   Your current blood pressure medications include carvedilol 25 mg twice daily and the combo medicine olmesartan amlodipine HCT  Sometimes beta-blockers like carvedilol can make somebody still fatigue or decreased energy.  Until you see the cardiologist, you can take the carvedilol 1/2 tablet twice daily which would be 12.5 mg twice daily  I would continue the olmesartan amlodipine HCTZ 1 tablet daily as is for  now   High cholesterol  Continue rosuvastatin Crestor 10 mg a day  This is a low to medium dose  Although most people tolerate this medicine just fine, one particular common side effect could be aches or weakness in the thighs or muscles  I do not believe you are having a side effect from this particular medication currently   Diabetes  You are only on Metformin currently at a low dose  Metformin can cause loose stool, upset stomach  You were prescribed [name] prior but this was not affordable  If for whatever reason if we need to stop Metformin in the future we could switch to generic glipizide which is an older medicine but  tends to do pretty good without side effect  I do recommend you check your blood sugars fasting in the morning at least 3 or 4 days/week to make sure your blood sugars are less than 130 and not running too high or too low   Constipation  I recommend you use one capful of MiraLAX powder daily in a beverage to help reduce constipation  If you are not having a daily soft bowel movement over the next 2 weeks then we can consider other prescription medications to help   Angellea was seen today for medication management.  Diagnoses and all orders for this visit:  Fatigue, unspecified type  Constipation, unspecified constipation type  Right-sided chest wall pain -     Ambulatory referral to Cardiology  Dizziness -     Ambulatory referral to Cardiology  Weak  Primary hypertension -     Ambulatory referral to Cardiology  Dyslipidemia associated with type 2 diabetes mellitus ([place]) -     Ambulatory referral to Cardiology  Type 2 diabetes mellitus with other specified complication, without long-term current use of insulin ([place]) -     Ambulatory referral to Cardiology  Vitamin D deficiency  Right sided abdominal pain  Noncompliance  Hyperlipidemia, unspecified hyperlipidemia type  Other orders -     Discontinue: meclizine (ANTIVERT) 25 MG  tablet; Take 1 tablet (25 mg total) by mouth 2 (two) times daily. -     meclizine (ANTIVERT) 25 MG tablet; Take 1 tablet (25 mg total) by mouth 2 (two) times daily.    Follow up: 53mo

## 2020-06-29 NOTE — Patient Instructions (Addendum)
Encounter Diagnoses  Name Primary?  . Fatigue, unspecified type Yes  . Constipation, unspecified constipation type   . Right-sided chest wall pain   . Dizziness   . Weak   . Primary hypertension   . Dyslipidemia associated with type 2 diabetes mellitus ([place])   . Type 2 diabetes mellitus with other specified complication, without long-term current use of insulin ([place])   . Vitamin D deficiency   . Right sided abdominal pain   . Noncompliance   . Hyperlipidemia, unspecified hyperlipidemia type    Dizziness  This can be caused by numerous issues  Dizziness can be related to inner ear problems  Dizziness can be related to abnormal rhythm of the heart  Dizziness can be related to low blood sugar  Dizziness can be related to dehydration  So I recommend you drink 80-100 units of fluids such as water daily  I recommend you check your blood sugars and blood pressures as below  Begin Meclizine as needed or tablet for vertigo/dizziness  Palpitations of the heart and high blood pressure  We will refer you to a cardiologist for further evaluation   High blood pressure  I would like you to check your blood pressure readings 3 days/week so you and I both know what you are running at home  Goal is 120/70.  Borderline is 130/80.  High blood pressure is anything over 140/90  BP Readings from Last 3 Encounters:  06/29/20 (!) 192/100  03/11/20 (!) 136/92  02/18/20 135/77   Your current blood pressure medications include carvedilol 25 mg twice daily and the combo medicine olmesartan amlodipine HCT  Sometimes beta-blockers like carvedilol can make somebody still fatigue or decreased energy.  Until you see the cardiologist, you can take the carvedilol 1/2 tablet twice daily which would be 12.5 mg twice daily  I would continue the olmesartan amlodipine HCTZ 1 tablet daily as is for now    High cholesterol  Continue rosuvastatin Crestor 10 mg a day  This is a low to medium  dose  Although most people tolerate this medicine just fine, one particular common side effect could be aches or weakness in the thighs or muscles  I do not believe you are having a side effect from this particular medication currently   Diabetes  You are only on Metformin currently at a low dose  Metformin can cause loose stool, upset stomach  You were prescribed [name] prior but this was not affordable  If for whatever reason if we need to stop Metformin in the future we could switch to generic glipizide which is an older medicine but tends to do pretty good without side effect  I do recommend you check your blood sugars fasting in the morning at least 3 or 4 days/week to make sure your blood sugars are less than 130 and not running too high or too low   Constipation  I recommend you use one capful of MiraLAX powder daily in a beverage to help reduce constipation  If you are not having a daily soft bowel movement over the next 2 weeks then we can consider other prescription medications to help

## 2020-07-16 ENCOUNTER — Other Ambulatory Visit: Payer: Self-pay

## 2020-07-16 ENCOUNTER — Encounter: Payer: Self-pay | Admitting: Cardiology

## 2020-07-16 ENCOUNTER — Ambulatory Visit: Payer: BC Managed Care – PPO | Admitting: Cardiology

## 2020-07-16 VITALS — BP 140/86 | HR 76 | Temp 97.9°F | Resp 17 | Ht 64.0 in | Wt 191.2 lb

## 2020-07-16 DIAGNOSIS — E1165 Type 2 diabetes mellitus with hyperglycemia: Secondary | ICD-10-CM | POA: Diagnosis not present

## 2020-07-16 DIAGNOSIS — R072 Precordial pain: Secondary | ICD-10-CM | POA: Diagnosis not present

## 2020-07-16 DIAGNOSIS — E782 Mixed hyperlipidemia: Secondary | ICD-10-CM

## 2020-07-16 DIAGNOSIS — I10 Essential (primary) hypertension: Secondary | ICD-10-CM

## 2020-07-16 MED ORDER — METOPROLOL TARTRATE 25 MG PO TABS: 25.0000 mg | ORAL_TABLET | Freq: Two times a day (BID) | ORAL | 0 refills | Status: DC

## 2020-07-16 NOTE — Progress Notes (Signed)
Date:  07/16/2020   ID:  Penny Rice, DOB May 03, 1956, MRN [id number]  PCP:  Penny Hurl, PA-C  Cardiologist:  Penny Kras, DO, [facility] (established care 07/16/2020)  REASON FOR CONSULT: Chest pain  REQUESTING PHYSICIAN:  Penny Hurl, PA-C [address] [place],  [place] [address]  Chief Complaint  Patient presents with  . Chest Pain  . Hypertension    HPI  Penny Rice is a 65 y.o. female who presents to the office with a chief complaint of " chest pain." Patient's past medical history and cardiovascular risk factors include: HTN, HLD, NIDDM, postmenopausal female, obesity due to excess calories.  She is referred to the office at the request of Penny Hurl, PA-C for evaluation of Right sided chest pain and hypertension.  Patient has been having chest discomfort for the last 4 to 5 months.  Over the left lateral chest wall, brought on by lifting heavy objects or worsened with eating, not associated with effort related activities, does not always resolve with rest.  Given her multiple cardiovascular risk factors she is referred to cardiology for further evaluation.  Benign essential hypertension: I have also been asked to help manage patient's high blood pressure.  Patient states that her home systolic blood pressures are usually 135 mmHg.  She does not recall her diastolic blood pressures.  She checks her blood pressure every day.  And states that she has very minimal salt intake.  Patient's blood pressure at today's office visit is within acceptable range but not at goal.  Patient states that she is no longer dizzy as compared to when she saw her PCP in the past.  No family history of premature coronary disease or sudden cardiac death.  FUNCTIONAL STATUS: Works at [employer] and does heavy lifting. No structured exercise program or daily routine.   ALLERGIES: No Known Allergies  MEDICATION LIST PRIOR TO VISIT: Current Meds  Medication Sig  . aspirin EC 81 MG tablet  Take 1 tablet (81 mg total) by mouth daily.  . carvedilol (COREG) 25 MG tablet TAKE 1 TABLET(25 MG) BY MOUTH TWICE DAILY  . metFORMIN (GLUCOPHAGE) 500 MG tablet Take 1 tablet (500 mg total) by mouth daily with breakfast. (Patient taking differently: Take 500 mg by mouth daily with breakfast. Patient taking 1/2 daily)  . metoprolol tartrate (LOPRESSOR) 25 MG tablet Take 1 tablet (25 mg total) by mouth 2 (two) times daily for 10 days. Start one week before CT scan and stop once CT scan is done.  . Olmesartan-amLODIPine-HCTZ (TRIBENZOR) 40-10-25 MG TABS Take 1 tablet by mouth daily.  . polyethylene glycol powder (GLYCOLAX/MIRALAX) 17 GM/SCOOP powder Take 17 g by mouth 2 (two) times daily as needed.  . rosuvastatin (CRESTOR) 10 MG tablet TAKE 1 TABLET(10 MG) BY MOUTH AT BEDTIME  . VITAMIN D, CHOLECALCIFEROL, PO Take 25 mcg by mouth daily. 1000 IU     PAST MEDICAL HISTORY: Past Medical History:  Diagnosis Date  . Allergy   . Arthritis   . Diabetes mellitus without complication ([place])   . Hyperlipidemia   . Hypertension   . Vitamin D deficiency     PAST SURGICAL HISTORY: Past Surgical History:  Procedure Laterality Date  . ABDOMINAL HYSTERECTOMY     pt reports full  . COLONOSCOPY  2016   Dr. Lucio Edward  . COLONOSCOPY  02/18/2020  . POLYPECTOMY      FAMILY HISTORY: The patient family history includes Cancer in her son; Diabetes in her sister; Hypertension in  her mother, sister, sister, sister, sister, and sister; Other in her father.  SOCIAL HISTORY:  The patient  reports that she has never smoked. She has never used smokeless tobacco. She reports that she does not drink alcohol and does not use drugs.  REVIEW OF SYSTEMS: Review of Systems  Constitutional: Negative for chills and fever.  HENT: Negative for hoarse voice and nosebleeds.   Eyes: Negative for discharge, double vision and pain.  Cardiovascular: Negative for chest pain, claudication, dyspnea on exertion, leg swelling,  near-syncope, orthopnea, palpitations, paroxysmal nocturnal dyspnea and syncope.  Respiratory: Negative for hemoptysis and shortness of breath.   Musculoskeletal: Negative for muscle cramps and myalgias.  Gastrointestinal: Negative for abdominal pain, constipation, diarrhea, hematemesis, hematochezia, melena, nausea and vomiting.  Neurological: Negative for dizziness and light-headedness.    PHYSICAL EXAM: Vitals with BMI 07/16/2020 06/29/2020 03/11/2020  Height 5\' 4"  5\' 4"  5\' 4"   Weight 191 lbs 3 oz 189 lbs 10 oz 190 lbs 6 oz  BMI 32.8 38.25 05.39  Systolic 767 341 937  Diastolic 86 902 92  Pulse 76 73 79    CONSTITUTIONAL: Well-developed and well-nourished. No acute distress.  SKIN: Skin is warm and dry. No rash noted. No cyanosis. No pallor. No jaundice HEAD: Normocephalic and atraumatic.  EYES: No scleral icterus MOUTH/THROAT: Moist oral membranes.  NECK: No JVD present. No thyromegaly noted. No carotid bruits  LYMPHATIC: No visible cervical adenopathy.  CHEST Normal respiratory effort. No intercostal retractions  LUNGS: Clear to auscultation bilaterally.  No stridor. No wheezes. No rales.  CARDIOVASCULAR: Regular rate and rhythm, positive S1-S2, no murmurs rubs or gallops appreciated. ABDOMINAL: No apparent ascites.  EXTREMITIES: No peripheral edema  HEMATOLOGIC: No significant bruising NEUROLOGIC: Oriented to person, place, and time. Nonfocal. Normal muscle tone.  PSYCHIATRIC: Normal mood and affect. Normal behavior. Cooperative  CARDIAC DATABASE: EKG: 07/16/2020: Normal sinus rhythm, 64 bpm, PRWP, nonspecific T wave abnormality.   Echocardiogram: No results found for this or any previous visit from the past 1095 days.  Stress Testing: No results found for this or any previous visit from the past 1095 days.  Heart Catheterization: None  LABORATORY DATA: CBC Latest Ref Rng & Units 02/05/2020 01/28/2019  WBC 3.4 - 10.8 x10E3/uL 8.1 8.5  Hemoglobin 11.1 - 15.9 g/dL 12.3  13.4  Hematocrit 34.0 - 46.6 % 36.5 40.7  Platelets 150 - 450 x10E3/uL 262 261    CMP Latest Ref Rng & Units 02/05/2020 01/28/2019 11/01/2017  Glucose 65 - 99 mg/dL 137(H) 146(H) 131(H)  BUN 8 - 27 mg/dL 19 14 13   Creatinine 0.57 - 1.00 mg/dL 1.01(H) 0.85 0.79  Sodium 134 - 144 mmol/L 141 140 142  Potassium 3.5 - 5.2 mmol/L 3.5 3.7 4.0  Chloride 96 - 106 mmol/L 101 99 100  CO2 20 - 29 mmol/L 26 27 26   Calcium 8.7 - 10.3 mg/dL 10.1 10.3 10.3  Total Protein 6.0 - 8.5 g/dL 6.9 6.8 7.0  Total Bilirubin 0.0 - 1.2 mg/dL 0.4 0.5 0.4  Alkaline Phos 48 - 121 IU/L 80 81 87  AST 0 - 40 IU/L 14 16 16   ALT 0 - 32 IU/L 18 22 17     Lipid Panel     Component Value Date/Time   CHOL 186 02/05/2020 1104   TRIG 107 02/05/2020 1104   HDL 45 02/05/2020 1104   CHOLHDL 4.1 02/05/2020 1104   LDLCALC 122 (H) 02/05/2020 1104   LABVLDL 19 02/05/2020 1104    No components found for: NTPROBNP  No results for input(s): PROBNP in the last 8760 hours. Recent Labs    02/05/20 1106  TSH 0.498    BMP Recent Labs    02/05/20 1104  NA 141  K 3.5  CL 101  CO2 26  GLUCOSE 137*  BUN 19  CREATININE 1.01*  CALCIUM 10.1  GFRNONAA 60  GFRAA 69    HEMOGLOBIN A1C Lab Results  Component Value Date   HGBA1C 7.5 (H) 02/05/2020    IMPRESSION:    ICD-10-CM   1. Precordial pain  R07.2 CT CORONARY MORPH W/CTA COR W/SCORE W/CA W/CM &/OR WO/CM    CT CORONARY FRACTIONAL FLOW RESERVE DATA PREP    CT CORONARY FRACTIONAL FLOW RESERVE FLUID ANALYSIS    PCV ECHOCARDIOGRAM COMPLETE    metoprolol tartrate (LOPRESSOR) 25 MG tablet    Basic metabolic panel    Magnesium    Magnesium    CANCELED: Magnesium  2. Benign hypertension  I10 EKG 12-Lead    PCV RENAL/RENAL ARTERY DUPLEX COMPLETE  3. Mixed hyperlipidemia  E78.2   4. Type 2 diabetes mellitus with hyperglycemia, without long-term current use of insulin (HCC)  E11.65      RECOMMENDATIONS: Yuette Putnam is a 65 y.o. female whose past medical history and  cardiac risk factors include: Hypertension, hyperlipidemia, diabetes mellitus type 2, postmenopausal female, obesity due to excess calories.  Precordial pain: Patient symptoms have both typical and atypical features.  Given her multiple cardiovascular risk factors and a 10-year estimated risk of ASCVD of approximately 22.2% ischemic evaluation is warranted.  EKG shows normal sinus rhythm with nonspecific T wave abnormality.  Echocardiogram will be ordered to evaluate for structural heart disease and left ventricular systolic function.  Shared decision is to proceed with coronary CTA to rule out obstructive CAD.  Check blood work prior to coronary CTA.  Patient is instructed to start metoprolol a week prior to her coronary CTA.  Benign essential hypertension: Given her diabetes would recommend a systolic blood pressure goal of approximately 130 mmHg.  Office blood pressures are within acceptable range but not at goal.  Patient is asked to keep a log of her blood pressures and bring it in at the next visit to see if additional change in pharmacological therapy is warranted.  Also educated on the importance of a low-salt diet and increasing physical activity to 30 minutes a day 5 days a week as tolerated.  Check renal ultrasound.  Mixed hyperlipidemia: Continue statin therapy. Most recent lipid profile reviewed. LDL currently not at goal.  Recommend a goal LDL of less than 70 mg/dL. Currently managed by primary care provider.  FINAL MEDICATION LIST END OF ENCOUNTER: Meds ordered this encounter  Medications  . metoprolol tartrate (LOPRESSOR) 25 MG tablet    Sig: Take 1 tablet (25 mg total) by mouth 2 (two) times daily for 10 days. Start one week before CT scan and stop once CT scan is done.    Dispense:  20 tablet    Refill:  0     Current Outpatient Medications:  .  aspirin EC 81 MG tablet, Take 1 tablet (81 mg total) by mouth daily., Disp: 90 tablet, Rfl: 3 .  carvedilol (COREG) 25 MG tablet,  TAKE 1 TABLET(25 MG) BY MOUTH TWICE DAILY, Disp: 180 tablet, Rfl: 0 .  metFORMIN (GLUCOPHAGE) 500 MG tablet, Take 1 tablet (500 mg total) by mouth daily with breakfast. (Patient taking differently: Take 500 mg by mouth daily with breakfast. Patient taking 1/2 daily), Disp: 90  tablet, Rfl: 1 .  metoprolol tartrate (LOPRESSOR) 25 MG tablet, Take 1 tablet (25 mg total) by mouth 2 (two) times daily for 10 days. Start one week before CT scan and stop once CT scan is done., Disp: 20 tablet, Rfl: 0 .  Olmesartan-amLODIPine-HCTZ (TRIBENZOR) 40-10-25 MG TABS, Take 1 tablet by mouth daily., Disp: 90 tablet, Rfl: 1 .  polyethylene glycol powder (GLYCOLAX/MIRALAX) 17 GM/SCOOP powder, Take 17 g by mouth 2 (two) times daily as needed., Disp: 116 g, Rfl: 0 .  rosuvastatin (CRESTOR) 10 MG tablet, TAKE 1 TABLET(10 MG) BY MOUTH AT BEDTIME, Disp: 90 tablet, Rfl: 3 .  VITAMIN D, CHOLECALCIFEROL, PO, Take 25 mcg by mouth daily. 1000 IU, Disp: , Rfl:   Orders Placed This Encounter  Procedures  . CT CORONARY MORPH W/CTA COR W/SCORE W/CA W/CM &/OR WO/CM  . CT CORONARY FRACTIONAL FLOW RESERVE DATA PREP  . CT CORONARY FRACTIONAL FLOW RESERVE FLUID ANALYSIS  . Basic metabolic panel  . Magnesium  . EKG 12-Lead  . PCV ECHOCARDIOGRAM COMPLETE  . PCV RENAL/RENAL ARTERY DUPLEX COMPLETE    There are no Patient Instructions on file for this visit.   --Continue cardiac medications as reconciled in final medication list. --Return in about 4 weeks (around 08/13/2020) for Follow up, BP, Chest pain. Or sooner if needed. --Continue follow-up with your primary care physician regarding the management of your other chronic comorbid conditions.  Patient's questions and concerns were addressed to her satisfaction. She voices understanding of the instructions provided during this encounter.   This note was created using a voice recognition software as a result there may be grammatical errors inadvertently enclosed that do not reflect  the nature of this encounter. Every attempt is made to correct such errors.  Penny Rice, [place], [facility]  Pager: [phone] Office: [phone]

## 2020-07-23 DIAGNOSIS — R072 Precordial pain: Secondary | ICD-10-CM | POA: Diagnosis not present

## 2020-07-24 LAB — MAGNESIUM: Magnesium: 1.8 mg/dL (ref 1.6–2.3)

## 2020-07-24 LAB — BASIC METABOLIC PANEL
BUN/Creatinine Ratio: 23 (ref 12–28)
BUN: 19 mg/dL (ref 8–27)
CO2: 25 mmol/L (ref 20–29)
Calcium: 10.7 mg/dL — ABNORMAL HIGH (ref 8.7–10.3)
Chloride: 103 mmol/L (ref 96–106)
Creatinine, Ser: 0.82 mg/dL (ref 0.57–1.00)
GFR calc Af Amer: 88 mL/min/{1.73_m2} (ref >59)
GFR calc non Af Amer: 76 mL/min/{1.73_m2} (ref >59)
Glucose: 112 mg/dL — ABNORMAL HIGH (ref 65–99)
Potassium: 3.5 mmol/L (ref 3.5–5.2)
Sodium: 144 mmol/L (ref 134–144)

## 2020-07-30 ENCOUNTER — Ambulatory Visit: Payer: BC Managed Care – PPO

## 2020-07-30 ENCOUNTER — Other Ambulatory Visit: Payer: Self-pay

## 2020-07-30 DIAGNOSIS — R072 Precordial pain: Secondary | ICD-10-CM | POA: Diagnosis not present

## 2020-07-30 DIAGNOSIS — I10 Essential (primary) hypertension: Secondary | ICD-10-CM

## 2020-07-31 ENCOUNTER — Other Ambulatory Visit: Payer: Self-pay

## 2020-07-31 DIAGNOSIS — R072 Precordial pain: Secondary | ICD-10-CM

## 2020-07-31 MED ORDER — METOPROLOL TARTRATE 25 MG PO TABS: 25.0000 mg | ORAL_TABLET | Freq: Two times a day (BID) | ORAL | 0 refills | Status: DC

## 2020-08-03 ENCOUNTER — Other Ambulatory Visit: Payer: BC Managed Care – PPO

## 2020-08-04 ENCOUNTER — Other Ambulatory Visit: Payer: Self-pay | Admitting: Medical

## 2020-08-04 DIAGNOSIS — Z1231 Encounter for screening mammogram for malignant neoplasm of breast: Secondary | ICD-10-CM

## 2020-08-10 ENCOUNTER — Telehealth (HOSPITAL_COMMUNITY): Payer: Self-pay | Admitting: *Deleted

## 2020-08-10 NOTE — Telephone Encounter (Signed)
Reaching out to patient to offer assistance regarding upcoming cardiac imaging study; pt verbalizes understanding of appt date/time, parking situation and where to check in, pre-test NPO status and medications ordered, and verified current allergies; name and call back number provided for further questions should they arise  Merle Prescott RN Navigator Cardiac Imaging [facility] [phone] office [phone] cell  

## 2020-08-11 ENCOUNTER — Ambulatory Visit (HOSPITAL_COMMUNITY)
Admission: RE | Admit: 2020-08-11 | Discharge: 2020-08-11 | Disposition: A | Discharge status: Home / Self Care | Payer: BC Managed Care – PPO | Source: Ambulatory Visit | Attending: Medical | Admitting: Medical

## 2020-08-11 ENCOUNTER — Encounter (HOSPITAL_COMMUNITY): Payer: Self-pay

## 2020-08-11 ENCOUNTER — Other Ambulatory Visit: Payer: Self-pay

## 2020-08-11 DIAGNOSIS — R072 Precordial pain: Secondary | ICD-10-CM | POA: Diagnosis not present

## 2020-08-11 MED ORDER — NITROGLYCERIN 0.4 MG SL SUBL
SUBLINGUAL_TABLET | SUBLINGUAL | Status: AC
  Filled 2020-08-11: qty 2

## 2020-08-11 MED ORDER — IOHEXOL 350 MG/ML SOLN
100.0000 mL | Freq: Once | INTRAVENOUS | Status: AC | PRN
  Administered 2020-08-11: 100 mL via INTRAVENOUS

## 2020-08-11 MED ORDER — NITROGLYCERIN 0.4 MG SL SUBL
0.8000 mg | SUBLINGUAL_TABLET | Freq: Once | SUBLINGUAL | Status: AC
  Administered 2020-08-11: 0.8 mg via SUBLINGUAL

## 2020-08-12 ENCOUNTER — Ambulatory Visit (HOSPITAL_COMMUNITY)
Admission: RE | Admit: 2020-08-12 | Discharge: 2020-08-12 | Disposition: A | Discharge status: Home / Self Care | Payer: BC Managed Care – PPO | Source: Ambulatory Visit | Attending: Cardiology | Admitting: Cardiology

## 2020-08-12 DIAGNOSIS — R072 Precordial pain: Secondary | ICD-10-CM | POA: Diagnosis not present

## 2020-08-12 NOTE — Progress Notes (Signed)
Called and spoke with patient regarding her echocardiogram results.

## 2020-08-14 ENCOUNTER — Other Ambulatory Visit: Payer: Self-pay

## 2020-08-14 ENCOUNTER — Ambulatory Visit: Payer: BC Managed Care – PPO | Admitting: Cardiology

## 2020-08-14 ENCOUNTER — Encounter: Payer: Self-pay | Admitting: Cardiology

## 2020-08-14 VITALS — BP 136/86 | HR 66 | Temp 98.1°F | Resp 16 | Ht 64.0 in | Wt 189.6 lb

## 2020-08-14 DIAGNOSIS — E1165 Type 2 diabetes mellitus with hyperglycemia: Secondary | ICD-10-CM

## 2020-08-14 DIAGNOSIS — I2510 Atherosclerotic heart disease of native coronary artery without angina pectoris: Secondary | ICD-10-CM

## 2020-08-14 DIAGNOSIS — E782 Mixed hyperlipidemia: Secondary | ICD-10-CM

## 2020-08-14 DIAGNOSIS — R072 Precordial pain: Secondary | ICD-10-CM | POA: Diagnosis not present

## 2020-08-14 DIAGNOSIS — E6609 Other obesity due to excess calories: Secondary | ICD-10-CM

## 2020-08-14 DIAGNOSIS — I2584 Coronary atherosclerosis due to calcified coronary lesion: Secondary | ICD-10-CM

## 2020-08-14 DIAGNOSIS — I10 Essential (primary) hypertension: Secondary | ICD-10-CM | POA: Diagnosis not present

## 2020-08-14 MED ORDER — ROSUVASTATIN CALCIUM 20 MG PO TABS: 20.0000 mg | ORAL_TABLET | Freq: Every day | ORAL | 0 refills | Status: DC

## 2020-08-14 NOTE — Progress Notes (Signed)
ID:  Penny Rice, DOB 03-08-1956, MRN [id number]  PCP:  Carlena Hurl, PA-C  Cardiologist:  Rex Kras, DO, [facility] (established care 07/16/2020)  Date: 08/14/2020 Last Office Visit: 07/16/2020  Chief Complaint  Patient presents with  . Hypertension  . Precordial pain  . Follow-up    4 weeks    HPI  Penny Rice is a 65 y.o. female who presents to the office with a chief complaint of " reevaluation of chest pain and review test results." Patient's past medical history and cardiovascular risk factors include: Severe coronary artery calcification, atherosclerosis of the coronary arteries per coronary CTA, HTN, HLD, NIDDM, postmenopausal female, obesity due to excess calories.  She is referred to the office at the request of Carlena Hurl, PA-C for evaluation of Right sided chest pain and hypertension.  At the last office visit given her symptoms of precordial pain and multiple cardiovascular risk factors including a 10-year estimated risk of ASCVD approximately 22% shared decision was to proceed with coronary CTA to further evaluate for obstructive CAD.  In addition, given her underlying high blood pressure she was educated on the importance of a low-salt diet, increasing physical activity as tolerated, and to check for renal artery stenosis.  Since last visit patient states that her precordial pain has resolved.  I reviewed the coronary artery CT results with the patient and it showed her the degree of plaque that is in her epicardial coronary arteries.  Findings noted below for further reference.  With regards to blood pressure her systolic blood pressures have improved since last visit.  Renal artery duplex is negative for renal artery stenosis.  No family history of premature coronary disease or sudden cardiac death.  FUNCTIONAL STATUS: Works at [employer] and does heavy lifting. No structured exercise program or daily routine.   ALLERGIES: No Known Allergies  MEDICATION LIST PRIOR  TO VISIT: Current Meds  Medication Sig  . aspirin EC 81 MG tablet Take 1 tablet (81 mg total) by mouth daily.  . carvedilol (COREG) 25 MG tablet TAKE 1 TABLET(25 MG) BY MOUTH TWICE DAILY  . metFORMIN (GLUCOPHAGE) 500 MG tablet Take 1 tablet (500 mg total) by mouth daily with breakfast. (Patient taking differently: Take 500 mg by mouth daily with breakfast. Patient taking 1/2 daily)  . Olmesartan-amLODIPine-HCTZ (TRIBENZOR) 40-10-25 MG TABS Take 1 tablet by mouth daily.  . polyethylene glycol powder (GLYCOLAX/MIRALAX) 17 GM/SCOOP powder Take 17 g by mouth 2 (two) times daily as needed.  Marland Kitchen VITAMIN D, CHOLECALCIFEROL, PO Take 25 mcg by mouth daily. 1000 IU  . [DISCONTINUED] metoprolol tartrate (LOPRESSOR) 25 MG tablet Take 1 tablet (25 mg total) by mouth 2 (two) times daily for 10 days. Start one week before CT scan and stop once CT scan is done.  . [DISCONTINUED] rosuvastatin (CRESTOR) 10 MG tablet TAKE 1 TABLET(10 MG) BY MOUTH AT BEDTIME     PAST MEDICAL HISTORY: Past Medical History:  Diagnosis Date  . Allergy   . Arthritis   . Diabetes mellitus without complication ([place])   . Hyperlipidemia   . Hypertension   . Vitamin D deficiency     PAST SURGICAL HISTORY: Past Surgical History:  Procedure Laterality Date  . ABDOMINAL HYSTERECTOMY     pt reports full  . COLONOSCOPY  2016   Dr. Lucio Edward  . COLONOSCOPY  02/18/2020  . POLYPECTOMY      FAMILY HISTORY: The patient family history includes Cancer in her son; Diabetes in her sister; Hypertension in  her mother, sister, sister, sister, sister, and sister; Other in her father.  SOCIAL HISTORY:  The patient  reports that she has never smoked. She has never used smokeless tobacco. She reports that she does not drink alcohol and does not use drugs.  REVIEW OF SYSTEMS: Review of Systems  Constitutional: Negative for chills and fever.  HENT: Negative for hoarse voice and nosebleeds.   Eyes: Negative for discharge, double vision  and pain.  Cardiovascular: Negative for chest pain, claudication, dyspnea on exertion, leg swelling, near-syncope, orthopnea, palpitations, paroxysmal nocturnal dyspnea and syncope.  Respiratory: Negative for hemoptysis and shortness of breath.   Musculoskeletal: Negative for muscle cramps and myalgias.  Gastrointestinal: Negative for abdominal pain, constipation, diarrhea, hematemesis, hematochezia, melena, nausea and vomiting.  Neurological: Negative for dizziness and light-headedness.    PHYSICAL EXAM: Vitals with BMI 08/14/2020 08/11/2020 08/11/2020  Height 5\' 4"  - -  Weight 189 lbs 10 oz - -  BMI 16.10 - -  Systolic 960 454 098  Diastolic 86 89 119  Pulse 66 72 59    CONSTITUTIONAL: Well-developed and well-nourished. No acute distress.  SKIN: Skin is warm and dry. No rash noted. No cyanosis. No pallor. No jaundice HEAD: Normocephalic and atraumatic.  EYES: No scleral icterus MOUTH/THROAT: Moist oral membranes.  NECK: No JVD present. No thyromegaly noted. No carotid bruits  LYMPHATIC: No visible cervical adenopathy.  CHEST Normal respiratory effort. No intercostal retractions  LUNGS: Clear to auscultation bilaterally.  No stridor. No wheezes. No rales.  CARDIOVASCULAR: Regular rate and rhythm, positive S1-S2, no murmurs rubs or gallops appreciated. ABDOMINAL: No apparent ascites.  EXTREMITIES: No peripheral edema  HEMATOLOGIC: No significant bruising NEUROLOGIC: Oriented to person, place, and time. Nonfocal. Normal muscle tone.  PSYCHIATRIC: Normal mood and affect. Normal behavior. Cooperative  CARDIAC DATABASE: EKG: 07/16/2020: Normal sinus rhythm, 64 bpm, PRWP, nonspecific T wave abnormality.   Echocardiogram: 07/30/2020:  Left ventricle cavity is normal in size. Mild concentric hypertrophy of the left ventricle. Normal global wall motion. Normal LV systolic function with EF 55%. Doppler evidence of grade I diastolic dysfunction, normal LAP.  Left atrial cavity is mildly  dilated. Mild (Grade I) mitral regurgitation. Mild tricuspid regurgitation.  No evidence of pulmonary hypertension.  Stress Testing: No results found for this or any previous visit from the past 1095 days.  Heart Catheterization: None  Renal artery duplex  07/30/2020:  No evidence of renal artery occlusive disease in either renal artery. Normal intrarenal vascular perfusion is noted in both kidneys. Renal length is within normal limits for both kidneys. Normal abdominal aorta flow velocities noted. No significant atherosclerotic plaque noted in the abdominal aorta.   Coronary CTA 08/11/2020:  1. Total coronary calcium score of 1404. This was 99th percentile for age and sex matched control.  2. Normal coronary origin with right dominance.  3. CAD-RADS 4. Approximately 70 percent stenosis in the proximal LAD due to mixed plaque. Ramus intermedius overall patent with diffuse calcified and noncalcified plaque. Moderate stenosis (50-69%) at the ostial CX due to calcified plaque. Diffuse calcified and noncalcified plaque within the proximal to mid RCA. Mild stenosis (25-49%) in distal RCA due to mixed plaque.  4. Aortic atherosclerosis within the ascending and the descending thoracic aorta.  4. Study will be sent to CT FFR to further evaluate the proximal LAD and the ostial LCX. Findings only performed and reported separately.  CT-FFR report: final report forthcoming.  No hemodynamically significant stenosis.  LABORATORY DATA: CBC Latest Ref Rng & Units  02/05/2020 01/28/2019  WBC 3.4 - 10.8 x10E3/uL 8.1 8.5  Hemoglobin 11.1 - 15.9 g/dL 12.3 13.4  Hematocrit 34.0 - 46.6 % 36.5 40.7  Platelets 150 - 450 x10E3/uL 262 261    CMP Latest Ref Rng & Units 07/23/2020 02/05/2020 01/28/2019  Glucose 65 - 99 mg/dL 112(H) 137(H) 146(H)  BUN 8 - 27 mg/dL 19 19 14   Creatinine 0.57 - 1.00 mg/dL 0.82 1.01(H) 0.85  Sodium 134 - 144 mmol/L 144 141 140  Potassium 3.5 - 5.2 mmol/L 3.5 3.5 3.7  Chloride  96 - 106 mmol/L 103 101 99  CO2 20 - 29 mmol/L 25 26 27   Calcium 8.7 - 10.3 mg/dL 10.7(H) 10.1 10.3  Total Protein 6.0 - 8.5 g/dL - 6.9 6.8  Total Bilirubin 0.0 - 1.2 mg/dL - 0.4 0.5  Alkaline Phos 48 - 121 IU/L - 80 81  AST 0 - 40 IU/L - 14 16  ALT 0 - 32 IU/L - 18 22    Lipid Panel     Component Value Date/Time   CHOL 186 02/05/2020 1104   TRIG 107 02/05/2020 1104   HDL 45 02/05/2020 1104   CHOLHDL 4.1 02/05/2020 1104   LDLCALC 122 (H) 02/05/2020 1104   LABVLDL 19 02/05/2020 1104    No components found for: NTPROBNP No results for input(s): PROBNP in the last 8760 hours. Recent Labs    02/05/20 1106  TSH 0.498    BMP Recent Labs    02/05/20 1104 07/23/20 1559  NA 141 144  K 3.5 3.5  CL 101 103  CO2 26 25  GLUCOSE 137* 112*  BUN 19 19  CREATININE 1.01* 0.82  CALCIUM 10.1 10.7*  GFRNONAA 60 76  GFRAA 69 88    HEMOGLOBIN A1C Lab Results  Component Value Date   HGBA1C 7.5 (H) 02/05/2020    IMPRESSION:    ICD-10-CM   1. Atherosclerosis of native coronary artery of native heart without angina pectoris  I25.10 rosuvastatin (CRESTOR) 20 MG tablet    Comp. Metabolic Panel (12)    LDL cholesterol, direct    Lipid Panel With LDL/HDL Ratio  2. Coronary atherosclerosis due to calcified coronary lesion of native artery  I25.10 rosuvastatin (CRESTOR) 20 MG tablet   I25.84 Comp. Metabolic Panel (12)    LDL cholesterol, direct    Lipid Panel With LDL/HDL Ratio  3. Precordial pain  R07.2   4. Benign hypertension  I10   5. Mixed hyperlipidemia  E78.2   6. Type 2 diabetes mellitus with hyperglycemia, without long-term current use of insulin (HCC)  E11.65   7. Class 1 obesity due to excess calories with serious comorbidity and body mass index (BMI) of 32.0 to 32.9 in adult  E66.09    Z68.32      RECOMMENDATIONS: Amyri Frenz is a 65 y.o. female whose past medical history and cardiac risk factors include: Severe coronary artery calcification, atherosclerosis of the  coronary arteries per coronary CTA, HTN, HLD, NIDDM, postmenopausal female, obesity due to excess calories.  1. Atherosclerosis of native coronary artery of native heart without angina pectoris Reviewed the results of the coronary CTA with the patient at today's office visit along with CT FFR findings. Her precordial discomfort has resolved since last office visit. Educated on the importance of aggressive lifestyle modifications and improving her modifiable cardiovascular risk factors. Educated on the importance of glycemic control and blood pressure management. Continue aspirin and statin therapy. Increase Crestor to 20 mg p.o. nightly. Fasting lipid profile in  3 months to reevaluate her lipids prior to her next visit.  2. Benign hypertension Office blood pressure is within acceptable range but not at goal. Given the fact that she is a diabetic goal blood pressure would be around 130/80. Continue current medical therapy. Increase physical activity as tolerated to 30 minutes a day 5 days a week. Low-salt diet recommended. She is asked to keep a log of her blood pressures and to bring it in at the next visit. Independently reviewed the lab results from 07/23/2020.  3. Mixed hyperlipidemia Given the findings of severe coronary artery calcification and CAD as per coronary CTA will increase her Crestor to 20 mg p.o. nightly. Risks, benefits, and alternatives to statin therapy discussed. She does not endorse myalgias.  4. Type 2 diabetes mellitus with hyperglycemia, without long-term current use of insulin ([place]) Patient is last hemoglobin A1c is not at goal. Patient states that she is working with her PCP to improve glycemic control.  5. Class 1 obesity due to excess calories with serious comorbidity and body mass index (BMI) of 32.0 to 32.9 in adult Body mass index is 32.54 kg/m.  Patient has lost 2 pounds since last visit.  She is congratulated on her efforts. I reviewed with the patient  the importance of diet, regular physical activity/exercise, weight loss.   Patient is educated on increasing physical activity gradually as tolerated.  With the goal of moderate intensity exercise for 30 minutes a day 5 days a week.  FINAL MEDICATION LIST END OF ENCOUNTER: Meds ordered this encounter  Medications  . rosuvastatin (CRESTOR) 20 MG tablet    Sig: Take 1 tablet (20 mg total) by mouth at bedtime. TAKE 1 TABLET(10 MG) BY MOUTH AT BEDTIME    Dispense:  90 tablet    Refill:  0     Current Outpatient Medications:  .  aspirin EC 81 MG tablet, Take 1 tablet (81 mg total) by mouth daily., Disp: 90 tablet, Rfl: 3 .  carvedilol (COREG) 25 MG tablet, TAKE 1 TABLET(25 MG) BY MOUTH TWICE DAILY, Disp: 180 tablet, Rfl: 0 .  metFORMIN (GLUCOPHAGE) 500 MG tablet, Take 1 tablet (500 mg total) by mouth daily with breakfast. (Patient taking differently: Take 500 mg by mouth daily with breakfast. Patient taking 1/2 daily), Disp: 90 tablet, Rfl: 1 .  Olmesartan-amLODIPine-HCTZ (TRIBENZOR) 40-10-25 MG TABS, Take 1 tablet by mouth daily., Disp: 90 tablet, Rfl: 1 .  polyethylene glycol powder (GLYCOLAX/MIRALAX) 17 GM/SCOOP powder, Take 17 g by mouth 2 (two) times daily as needed., Disp: 116 g, Rfl: 0 .  VITAMIN D, CHOLECALCIFEROL, PO, Take 25 mcg by mouth daily. 1000 IU, Disp: , Rfl:  .  rosuvastatin (CRESTOR) 20 MG tablet, Take 1 tablet (20 mg total) by mouth at bedtime. TAKE 1 TABLET(10 MG) BY MOUTH AT BEDTIME, Disp: 90 tablet, Rfl: 0  Orders Placed This Encounter  Procedures  . Comp. Metabolic Panel (12)  . LDL cholesterol, direct  . Lipid Panel With LDL/HDL Ratio    There are no Patient Instructions on file for this visit.   --Continue cardiac medications as reconciled in final medication list. --Return in about 15 weeks (around 11/27/2020) for Follow up, CAD, Lipid. Or sooner if needed. --Continue follow-up with your primary care physician regarding the management of your other chronic  comorbid conditions.  Patient's questions and concerns were addressed to her satisfaction. She voices understanding of the instructions provided during this encounter.   This note was created using a voice recognition  software as a result there may be grammatical errors inadvertently enclosed that do not reflect the nature of this encounter. Every attempt is made to correct such errors.  Rex Kras, [place], [facility]  Pager: [phone] Office: [phone]

## 2020-08-19 ENCOUNTER — Other Ambulatory Visit: Payer: Self-pay

## 2020-08-19 DIAGNOSIS — I2510 Atherosclerotic heart disease of native coronary artery without angina pectoris: Secondary | ICD-10-CM

## 2020-08-19 DIAGNOSIS — I2584 Coronary atherosclerosis due to calcified coronary lesion: Secondary | ICD-10-CM

## 2020-08-26 IMAGING — CR DG HUMERUS 2V *L*
2 series · 2 of 2 positions shown · non-contrast
Comparison: None.

CLINICAL DATA: Chronic pain

EXAM:
LEFT SHOULDER - 2+ VIEW; LEFT HUMERUS - 2+ VIEW

[w humerus ap left * (1 of 2)]
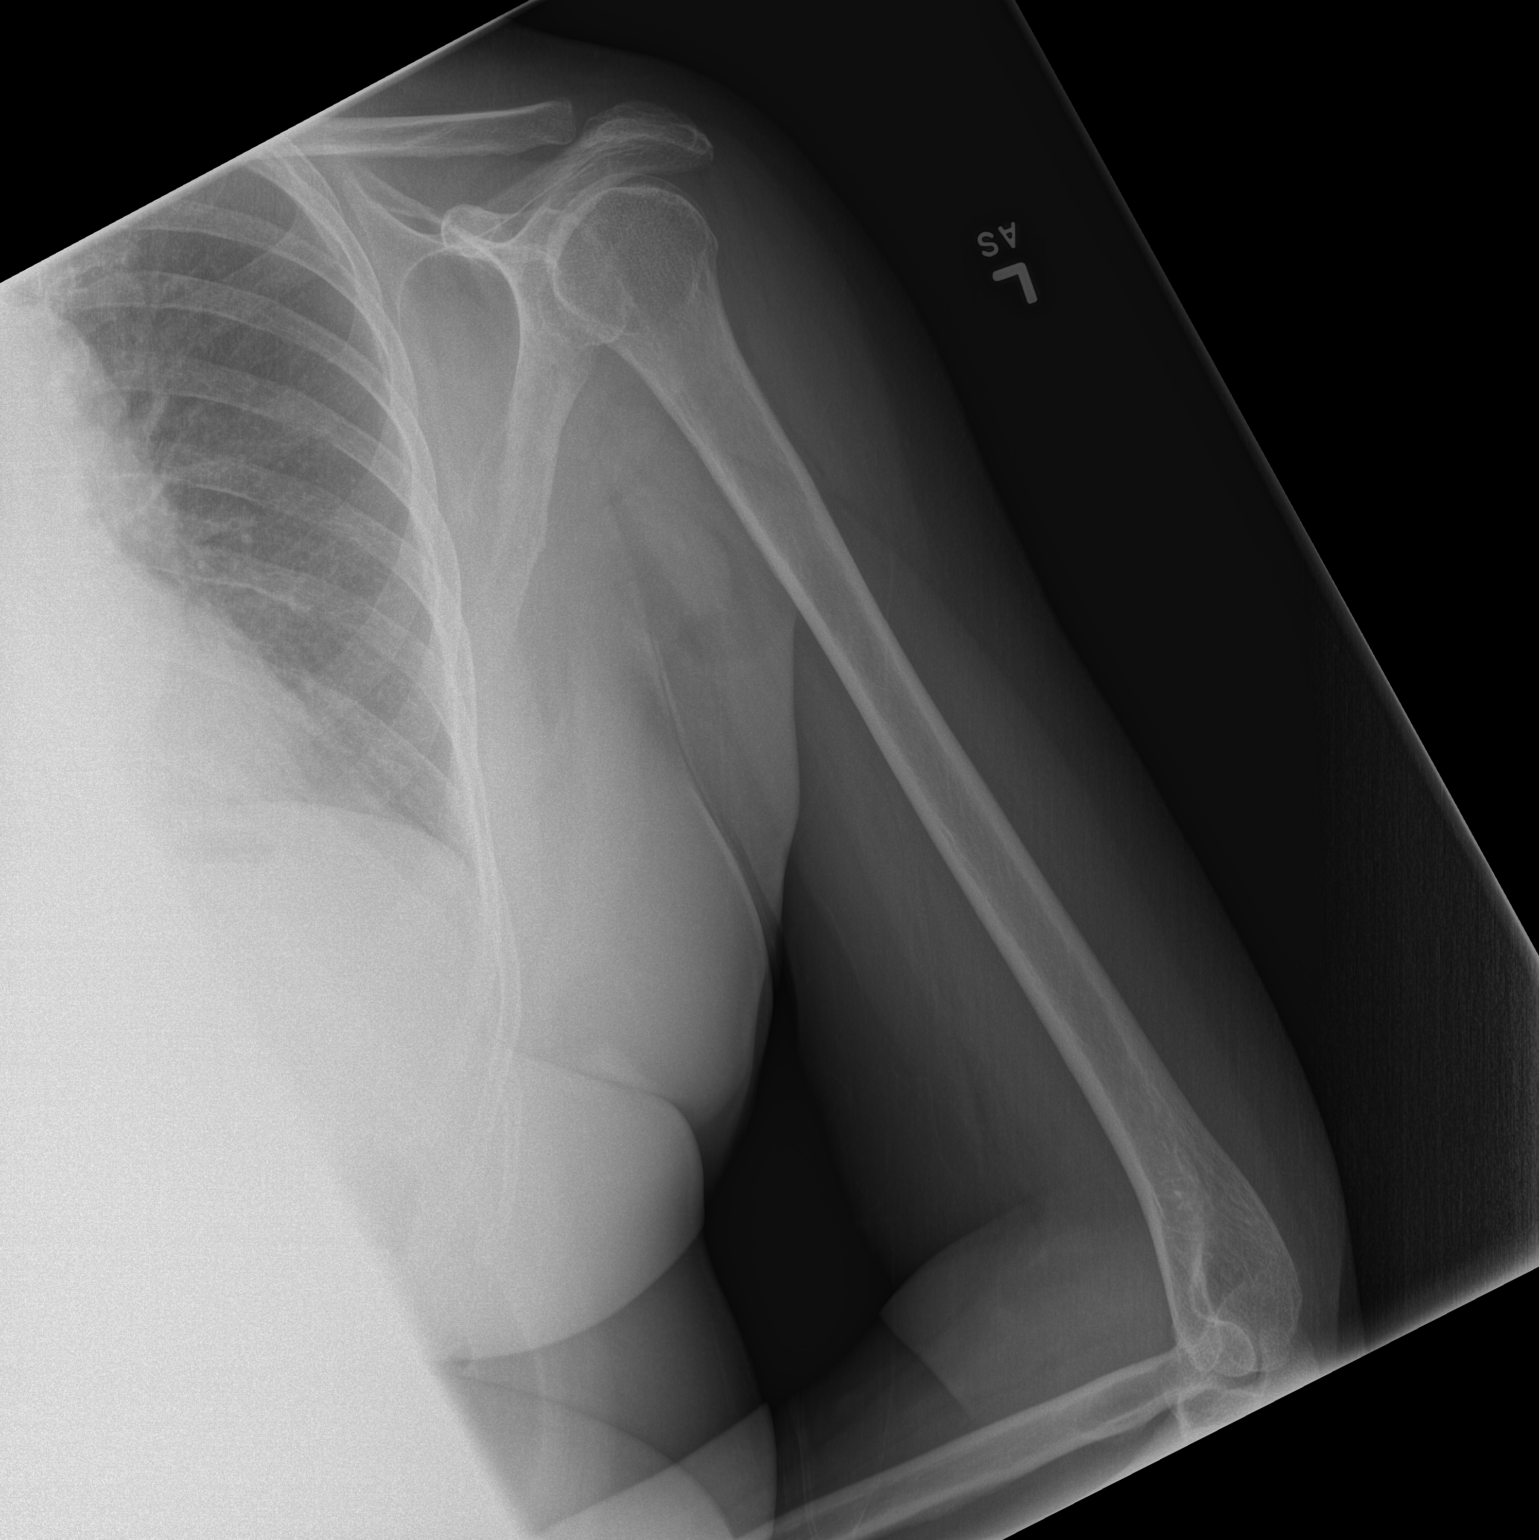

[w humerus ap left * (2 of 2)]
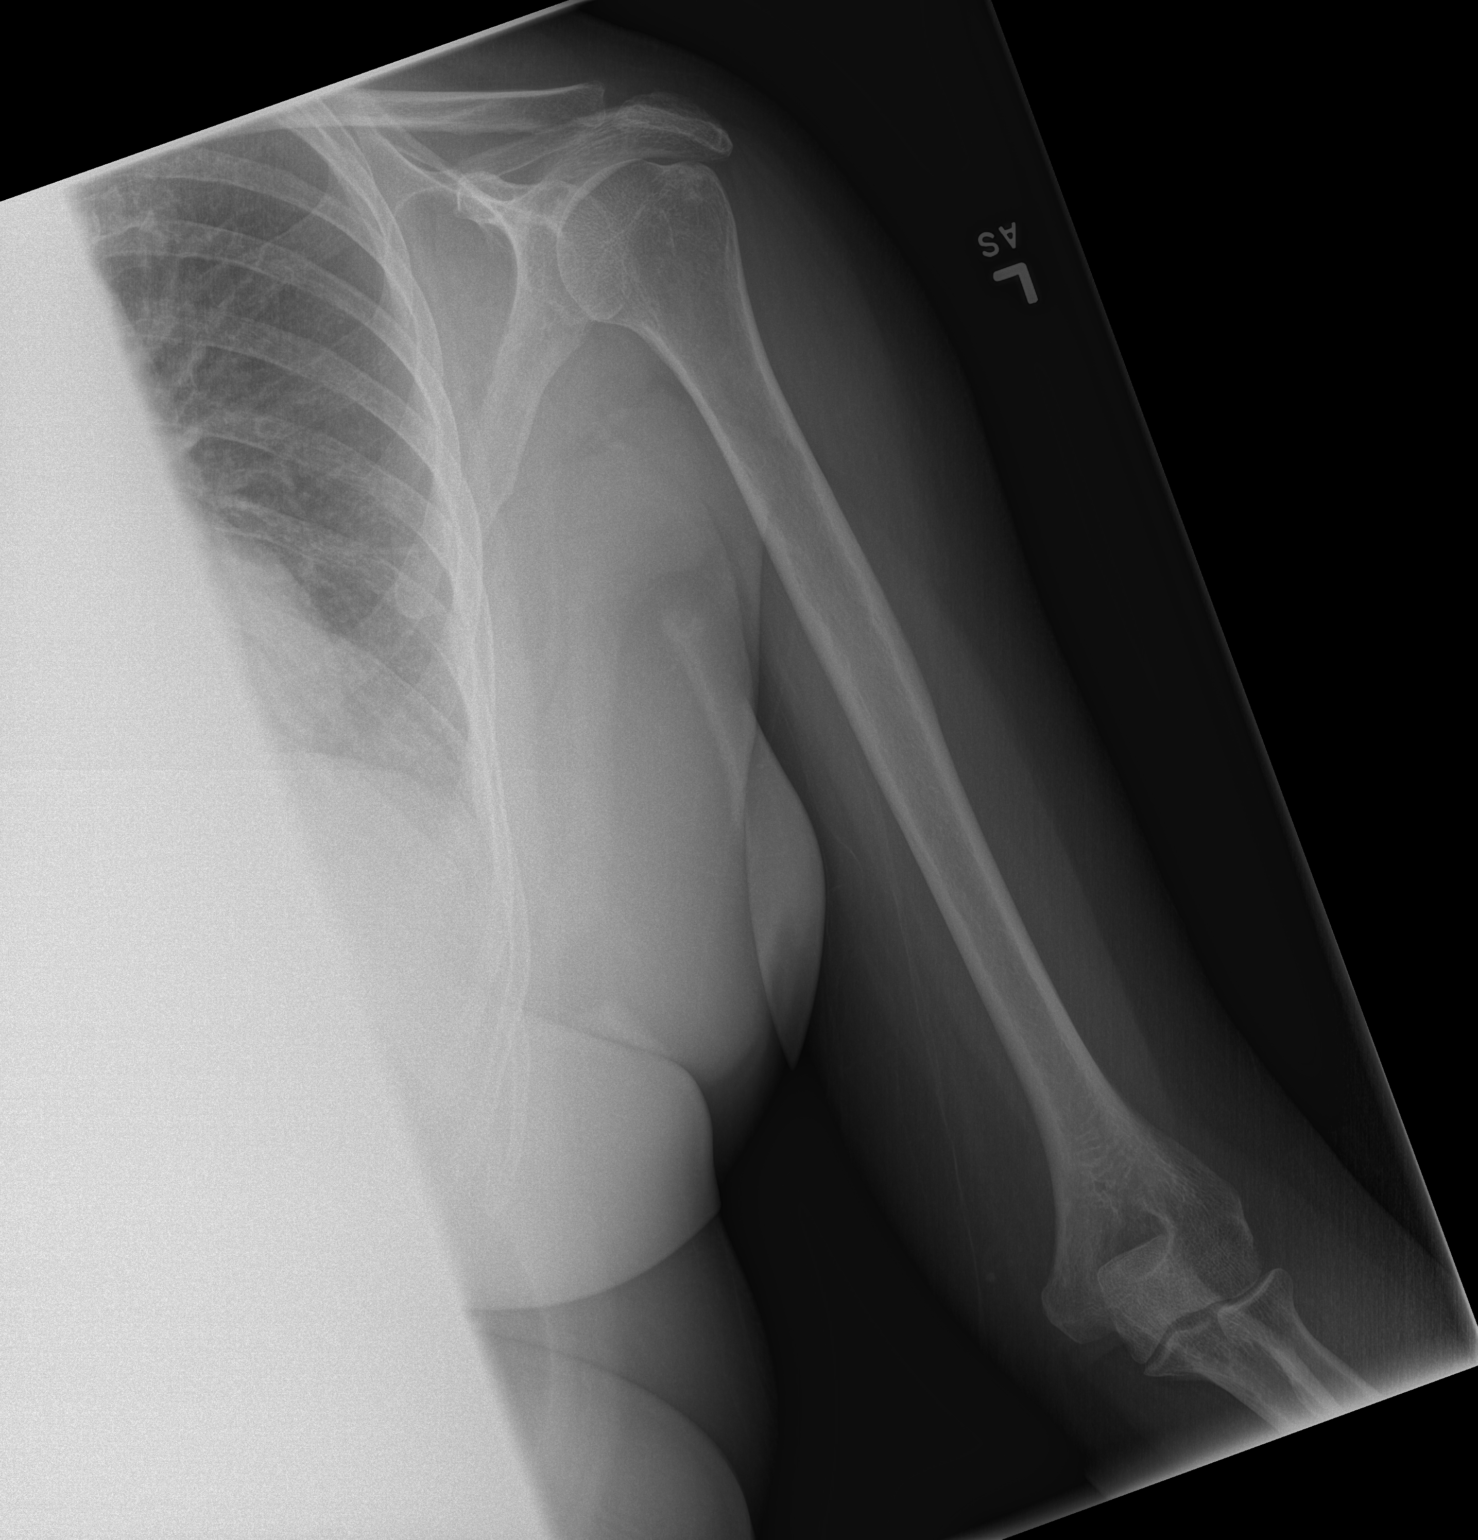

[2 of 2 positions shown; findings below may reference images not displayed]

FINDINGS: No acute fracture or dislocation. Joint spaces and alignment are
maintained. Os acromiale. No area of erosion or osseous destruction.
No unexpected radiopaque foreign body. Soft tissues are
unremarkable.
IMPRESSION: 1. No acute osseous abnormality in the left shoulder or humerus.
2. Os acromiale.

## 2020-08-26 IMAGING — CR DG SHOULDER 2+V*L*
3 series · 3 of 3 positions shown · non-contrast
Comparison: None.

CLINICAL DATA: Chronic pain

EXAM:
LEFT SHOULDER - 2+ VIEW; LEFT HUMERUS - 2+ VIEW

[w shoulder ap internal left *]
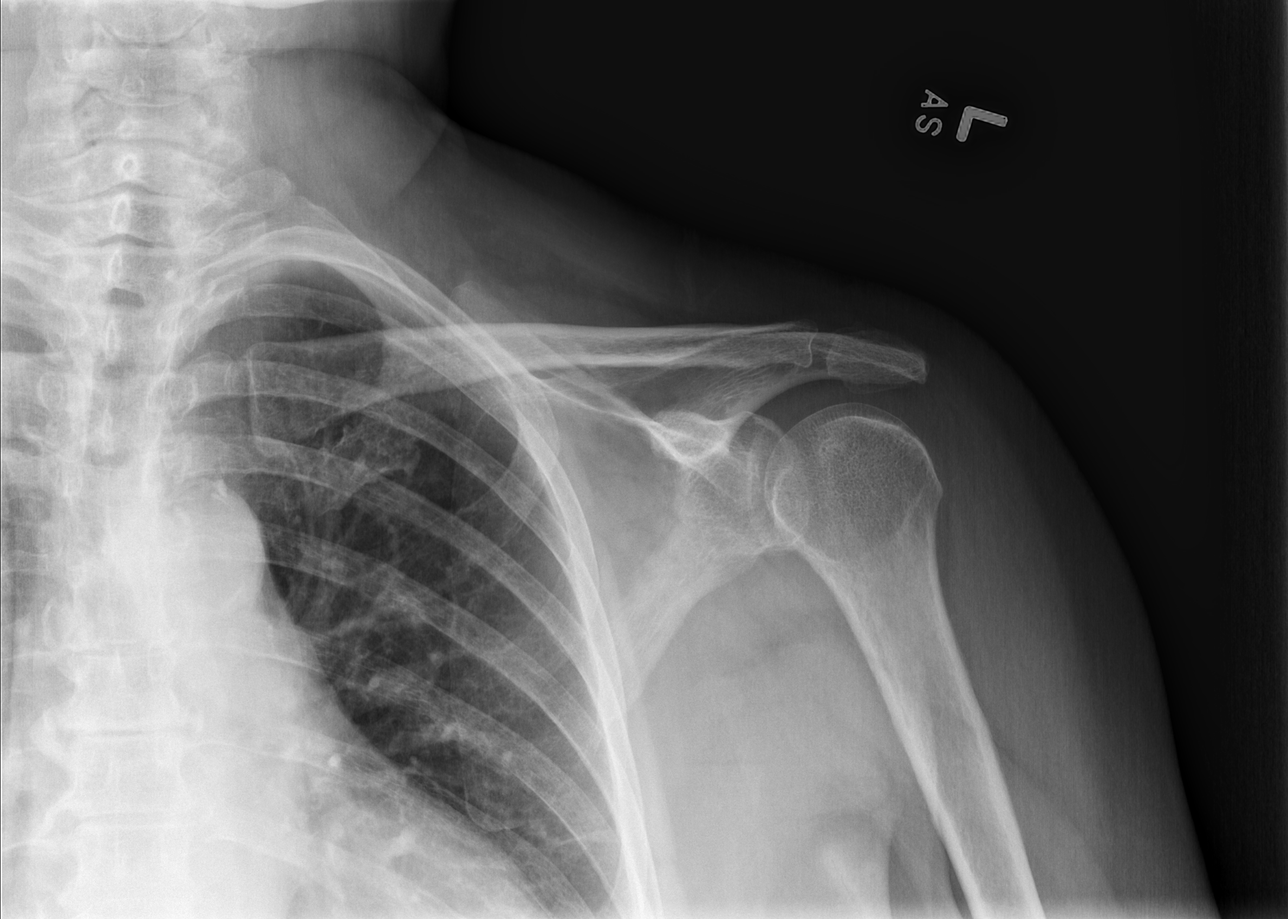

[w shoulder y view left *]
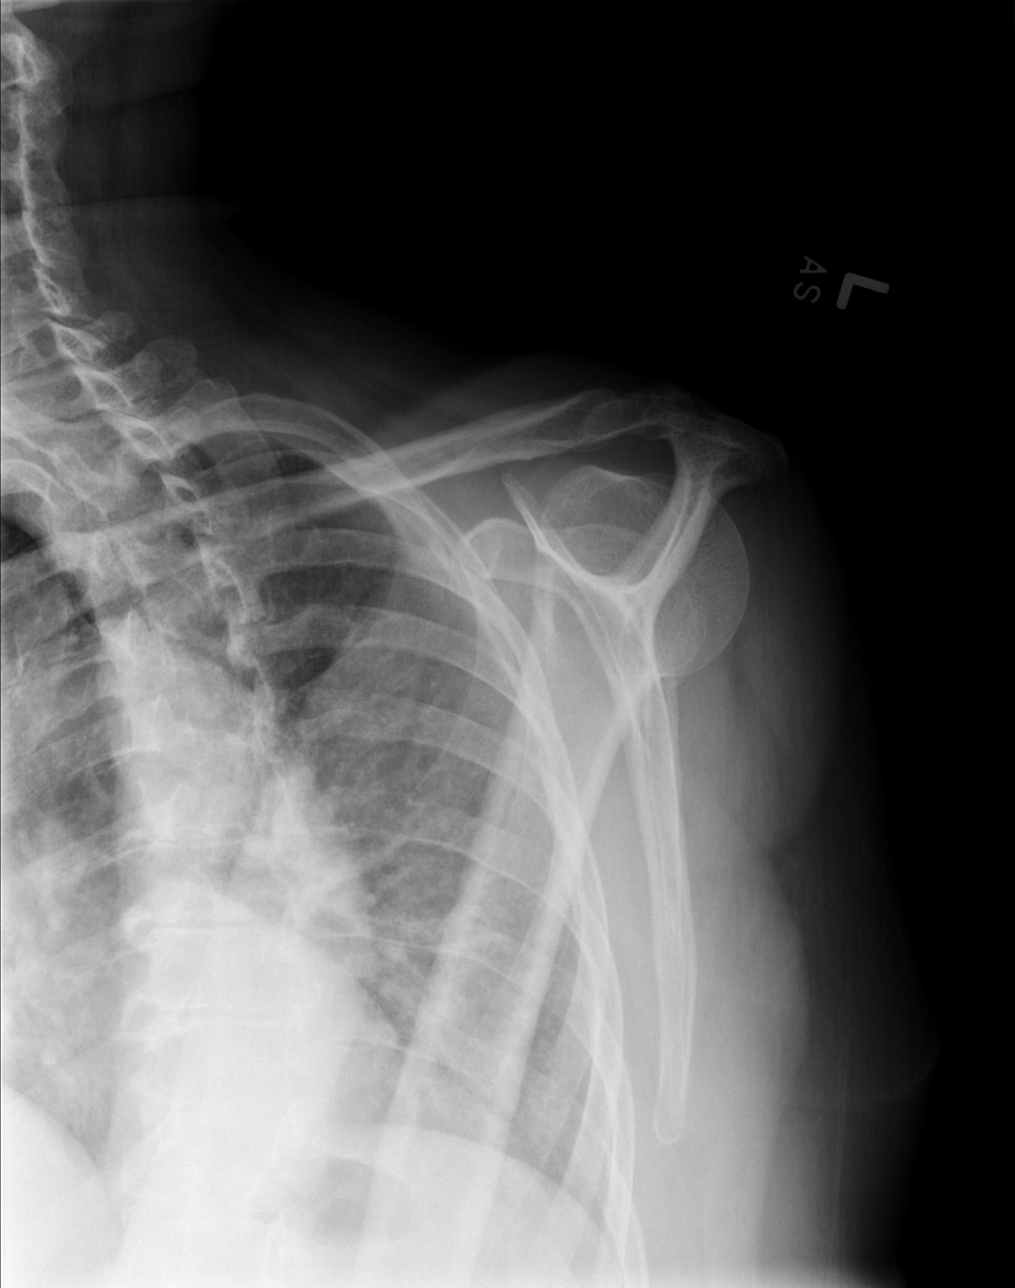

[w shoulder axillary left *]
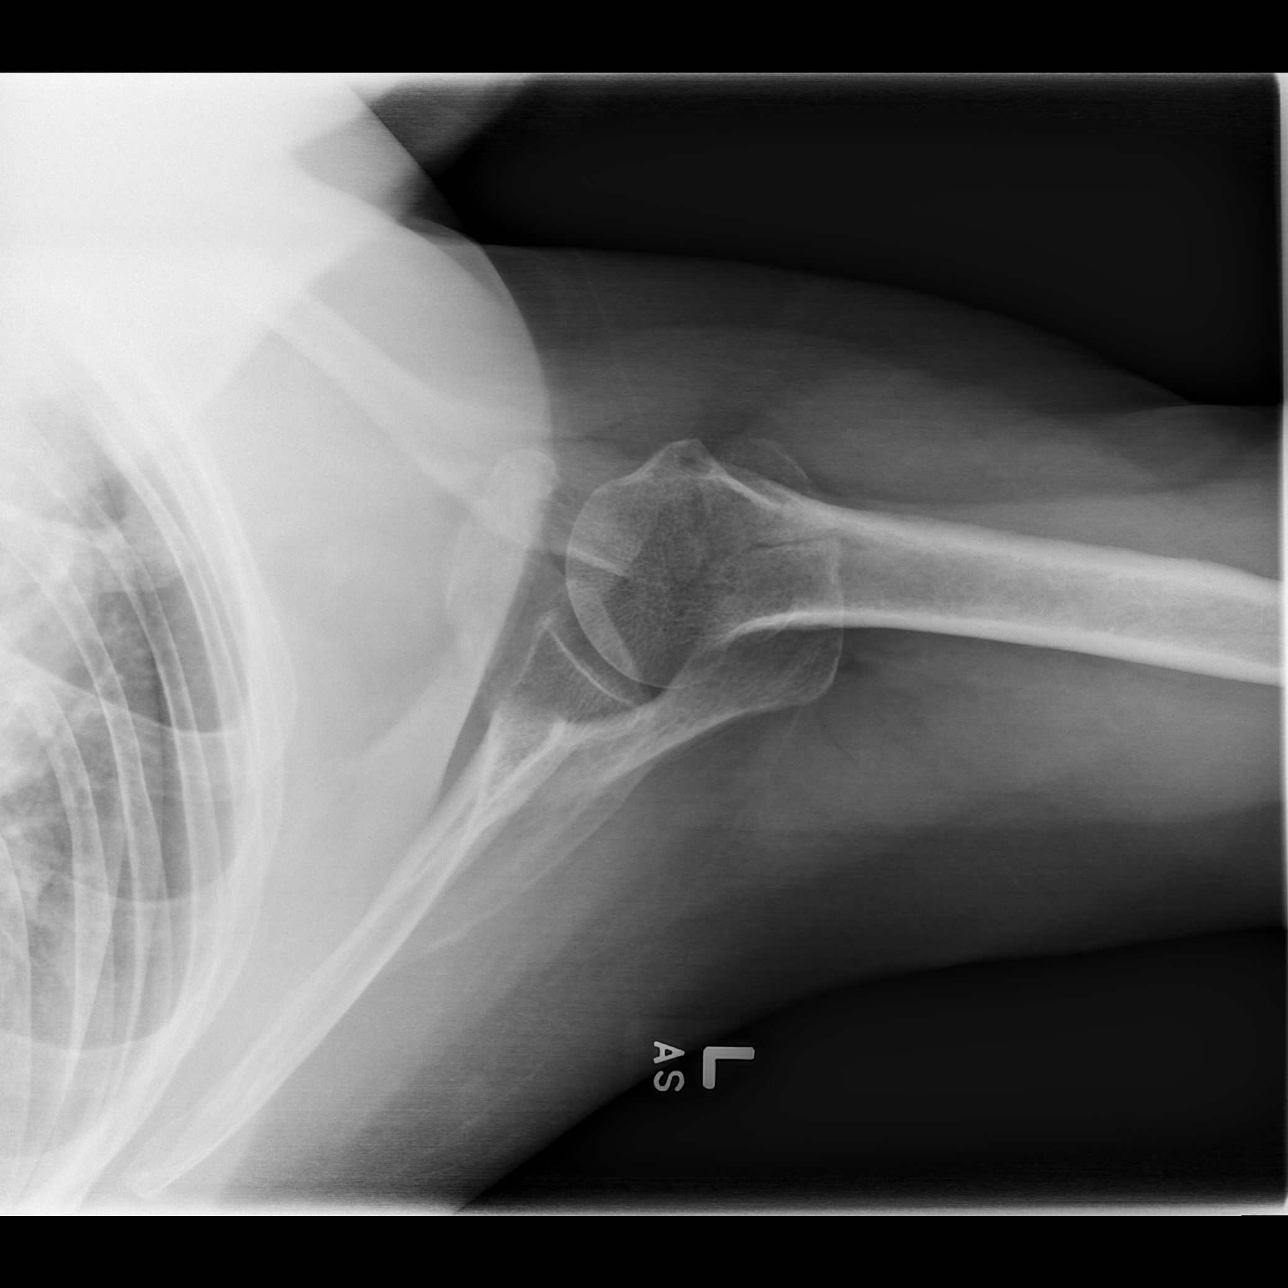

[3 of 3 positions shown; findings below may reference images not displayed]

FINDINGS: No acute fracture or dislocation. Joint spaces and alignment are
maintained. Os acromiale. No area of erosion or osseous destruction.
No unexpected radiopaque foreign body. Soft tissues are
unremarkable.
IMPRESSION: 1. No acute osseous abnormality in the left shoulder or humerus.
2. Os acromiale.

## 2020-09-09 ENCOUNTER — Encounter: Payer: Self-pay | Admitting: Medical

## 2020-09-09 ENCOUNTER — Ambulatory Visit (INDEPENDENT_AMBULATORY_CARE_PROVIDER_SITE_OTHER): Payer: BC Managed Care – PPO | Admitting: Medical

## 2020-09-09 ENCOUNTER — Other Ambulatory Visit: Payer: Self-pay

## 2020-09-09 VITALS — BP 112/76 | HR 71 | Ht 64.0 in | Wt 183.2 lb

## 2020-09-09 DIAGNOSIS — E1165 Type 2 diabetes mellitus with hyperglycemia: Secondary | ICD-10-CM | POA: Diagnosis not present

## 2020-09-09 DIAGNOSIS — J301 Allergic rhinitis due to pollen: Secondary | ICD-10-CM

## 2020-09-09 DIAGNOSIS — E782 Mixed hyperlipidemia: Secondary | ICD-10-CM | POA: Diagnosis not present

## 2020-09-09 DIAGNOSIS — R42 Dizziness and giddiness: Secondary | ICD-10-CM

## 2020-09-09 DIAGNOSIS — E860 Dehydration: Secondary | ICD-10-CM | POA: Insufficient documentation

## 2020-09-09 DIAGNOSIS — I10 Essential (primary) hypertension: Secondary | ICD-10-CM

## 2020-09-09 DIAGNOSIS — I7090 Unspecified atherosclerosis: Secondary | ICD-10-CM

## 2020-09-09 LAB — POCT GLYCOSYLATED HEMOGLOBIN (HGB A1C): Hemoglobin A1C: 7.3 % — AB (ref 4.0–5.6)

## 2020-09-09 MED ORDER — OLMESARTAN-AMLODIPINE-HCTZ 40-10-25 MG PO TABS: 1.0000 | ORAL_TABLET | Freq: Every day | ORAL | 1 refills | Status: DC

## 2020-09-09 MED ORDER — CARVEDILOL 25 MG PO TABS: 25.0000 mg | ORAL_TABLET | Freq: Two times a day (BID) | ORAL | 3 refills | Status: DC

## 2020-09-09 NOTE — Patient Instructions (Addendum)
You had orthostatic changes on your vital signs today and you appear dehydrated.  This is likely causing the dizziness  Recommendations:  Begin over-the-counter Flonase allergy nasal spray daily for the next few weeks as allergy symptoms may be playing a role with your dizziness  Increase your water intake as you appear dehydrated.  Drink 80-100 ounces of water daily at least.  Limit or avoid salty foods and salty beverages like soda as this can dry you out even more  Your symptoms should gradually improve over the next few days as you hydrate better  If worse or not improving or new symptoms within the next 5 days then call back  Diabetes marker stable.  Continue current medications.  I do recommend you check your sugars fasting daily in the morning with goal less than 130.  If you would like to try the freestyle libre let me know and I will send this to the pharmacy.  If your glucometer is not working or if you do not have one let me know     Summary from today Dizziness-partially due to allergy symptoms but mostly due to dehydration.  Advise she significantly increase her water intake daily, avoid salty and sugary foods.  If not much improved over the next 3 to 4 days then call back  Allergic rhinitis, head congestion-begin Flonase over-the-counter for the next few weeks during allergy season  Diabetes-noncompliant with glucometer testing.  We discussed freestyle libre as an option.  Advise she start checking her sugars regularly.  She is past due for follow-up.  Hemoglobin A1c reviewed today.  Continue current medication.  Continue daily foot checks  I reviewed her recent cardiology notes 08/14/2020 visit with Dr. Rex Kras.  She has significant atherosclerosis of the coronaries and aorta.  Her statin dose was increased.  She has follow-up with them in June  We discussed doing a carotid ultrasound today.  She declines for now

## 2020-09-09 NOTE — Progress Notes (Signed)
Subjective:  Penny Rice is a 65 y.o. female who presents for Chief Complaint  Patient presents with  . Dizziness    Dizziness started Sunday. Pt denies falling     Here for dizziness, pressure around eyes, some upset stomach, but no diarrhea.  Feels dizzy at times getting up.  Dizzy like unsteady when standing ,but no room spinning.  No hearing loss or tinnitus.   Dizziness would last seconds.  Would resolve with rest.  Went to soccer game this weekend, felt some drainage from allergies.   No ear pain, no sore throat.  Has some head congestion and stuffiness.   Not checking sugars, and didn't check glucose when dizzy  BPs been looking good.    Compliant with medicaiton.  She has not been checking her blood sugars at all.  No foot lesions, no increased thirst or increased urination.  We referred recent to cardiology.  She established with them and had coronary calcium scans and other evaluation.  She has follow-up coming up soon   No other aggravating or relieving factors.    No other c/o.  Past Medical History:  Diagnosis Date  . Allergy   . Arthritis   . Diabetes mellitus without complication ([place])   . Hyperlipidemia   . Hypertension   . Vitamin D deficiency    Current Outpatient Medications on File Prior to Visit  Medication Sig Dispense Refill  . aspirin EC 81 MG tablet Take 1 tablet (81 mg total) by mouth daily. 90 tablet 3  . metFORMIN (GLUCOPHAGE) 500 MG tablet Take 1 tablet (500 mg total) by mouth daily with breakfast. (Patient taking differently: Take 500 mg by mouth daily with breakfast. Patient taking 1/2 daily) 90 tablet 1  . rosuvastatin (CRESTOR) 20 MG tablet Take 1 tablet (20 mg total) by mouth at bedtime. TAKE 1 TABLET(10 MG) BY MOUTH AT BEDTIME 90 tablet 0  . VITAMIN D, CHOLECALCIFEROL, PO Take 25 mcg by mouth daily. 1000 IU    . polyethylene glycol powder (GLYCOLAX/MIRALAX) 17 GM/SCOOP powder Take 17 g by mouth 2 (two) times daily as needed. (Patient not taking:  Reported on 09/09/2020) 116 g 0   No current facility-administered medications on file prior to visit.     The following portions of the patient's history were reviewed and updated as appropriate: allergies, current medications, past family history, past medical history, past social history, past surgical history and problem list.  ROS Otherwise as in subjective above  Objective: BP 112/76   Pulse 71   Ht 5\' 4"  (1.626 m)   Wt 183 lb 3.2 oz (83.1 kg)   SpO2 98%   BMI 31.45 kg/m   General appearance: alert, no distress, well developed, well nourished, African-American female HEENT: normocephalic, sclerae anicteric, conjunctiva pink and moist, TMs flat, nares patent, no discharge or erythema, pharynx with slight postnasal drainage Oral cavity: dry MM, no lesions Neck: supple, no lymphadenopathy, no thyromegaly, no masses, no bruits Heart: RRR, normal S1, S2, no murmurs Lungs: CTA bilaterally, no wheezes, rhonchi, or rales Pulses: 2+ radial pulses, 2+ pedal pulses, normal cap refill Ext: no edema Neuro: CN II through XII intact, nonfocal exam, no cerebellar changes Psych: Pleasant, good eye contact, answers questions appropriately   Assessment: Encounter Diagnoses  Name Primary?  . Dizziness Yes  . Type 2 diabetes mellitus with hyperglycemia, without long-term current use of insulin ([place])   . Hypertension, unspecified type   . Mixed hyperlipidemia   . Atherosclerosis   . Dehydration   .  Allergic rhinitis due to pollen, unspecified seasonality   . Essential hypertension      Plan: Dizziness-partially due to allergy symptoms but mostly due to dehydration.  Advise she significantly increase her water intake daily, avoid salty and sugary foods.  If not much improved over the next 3 to 4 days then call back  Allergic rhinitis, head congestion-begin Flonase over-the-counter for the next few weeks during allergy season  Diabetes-noncompliant with glucometer testing.  We  discussed freestyle libre as an option.  Advise she start checking her sugars regularly.  She is past due for follow-up.  Hemoglobin A1c reviewed today.  Continue current medication.  Continue daily foot checks  I reviewed her recent cardiology notes 08/14/2020 visit with Dr. Rex Kras.  She has significant atherosclerosis of the coronaries and aorta.  Her statin dose was increased.  She has follow-up with them in June  We discussed doing a carotid ultrasound today.  She declines for now    Penny Rice was seen today for dizziness.  Diagnoses and all orders for this visit:  Dizziness  Type 2 diabetes mellitus with hyperglycemia, without long-term current use of insulin (HCC) -     HgB A1c  Hypertension, unspecified type  Mixed hyperlipidemia  Atherosclerosis  Dehydration  Allergic rhinitis due to pollen, unspecified seasonality  Essential hypertension -     carvedilol (COREG) 25 MG tablet; Take 1 tablet (25 mg total) by mouth 2 (two) times daily with a meal.  Other orders -     Olmesartan-amLODIPine-HCTZ (TRIBENZOR) 40-10-25 MG TABS; Take 1 tablet by mouth daily.    Follow up: with cardiology as planned, 46mo fasting here

## 2020-09-10 ENCOUNTER — Other Ambulatory Visit: Payer: Self-pay | Admitting: Medical

## 2020-09-10 ENCOUNTER — Telehealth: Payer: Self-pay

## 2020-09-10 MED ORDER — MECLIZINE HCL 25 MG PO TABS: 25.0000 mg | ORAL_TABLET | Freq: Two times a day (BID) | ORAL | 0 refills | Status: DC

## 2020-09-10 NOTE — Telephone Encounter (Signed)
Pt. Aware of meclizine sent in and of recommendations.

## 2020-09-10 NOTE — Telephone Encounter (Signed)
Pt. Called statin she just saw you yesterday and said she had discussed dizziness with you. She said she is still very dizzy and wants to know if you could call her in something for this issue. She uses [facility] on [address].

## 2020-09-10 NOTE — Telephone Encounter (Signed)
I sent meclizine she can take twice daily short-term for the next 4 to 5 days, significantly increase water intake as we discussed, continue Flonase nasal spray

## 2020-09-11 ENCOUNTER — Emergency Department (HOSPITAL_COMMUNITY)
Admission: EM | Admit: 2020-09-11 | Discharge: 2020-09-12 | Disposition: A | Discharge status: Home / Self Care | Payer: BC Managed Care – PPO | Attending: Emergency Medicine | Admitting: Emergency Medicine

## 2020-09-11 ENCOUNTER — Encounter (HOSPITAL_COMMUNITY): Payer: Self-pay | Admitting: Emergency Medicine

## 2020-09-11 ENCOUNTER — Other Ambulatory Visit: Payer: Self-pay

## 2020-09-11 DIAGNOSIS — Z7982 Long term (current) use of aspirin: Secondary | ICD-10-CM | POA: Insufficient documentation

## 2020-09-11 DIAGNOSIS — I10 Essential (primary) hypertension: Secondary | ICD-10-CM | POA: Diagnosis not present

## 2020-09-11 DIAGNOSIS — E119 Type 2 diabetes mellitus without complications: Secondary | ICD-10-CM | POA: Insufficient documentation

## 2020-09-11 DIAGNOSIS — R42 Dizziness and giddiness: Secondary | ICD-10-CM | POA: Diagnosis not present

## 2020-09-11 DIAGNOSIS — Z79899 Other long term (current) drug therapy: Secondary | ICD-10-CM | POA: Diagnosis not present

## 2020-09-11 DIAGNOSIS — Z7984 Long term (current) use of oral hypoglycemic drugs: Secondary | ICD-10-CM | POA: Insufficient documentation

## 2020-09-11 DIAGNOSIS — E876 Hypokalemia: Secondary | ICD-10-CM

## 2020-09-11 LAB — URINALYSIS, ROUTINE W REFLEX MICROSCOPIC
Bilirubin Urine: NEGATIVE
Glucose, UA: NEGATIVE mg/dL
Hgb urine dipstick: NEGATIVE
Ketones, ur: NEGATIVE mg/dL
Leukocytes,Ua: NEGATIVE
Nitrite: NEGATIVE
Protein, ur: NEGATIVE mg/dL
Specific Gravity, Urine: 1.01 (ref 1.005–1.030)
pH: 5 (ref 5.0–8.0)

## 2020-09-11 LAB — CBC WITH DIFFERENTIAL/PLATELET
Abs Immature Granulocytes: 0.04 10*3/uL (ref 0.00–0.07)
Basophils Absolute: 0.1 10*3/uL (ref 0.0–0.1)
Basophils Relative: 1 %
Eosinophils Absolute: 0.2 10*3/uL (ref 0.0–0.5)
Eosinophils Relative: 2 %
HCT: 39.2 % (ref 36.0–46.0)
Hemoglobin: 12.9 g/dL (ref 12.0–15.0)
Immature Granulocytes: 1 %
Lymphocytes Relative: 33 %
Lymphs Abs: 2.9 10*3/uL (ref 0.7–4.0)
MCH: 28.9 pg (ref 26.0–34.0)
MCHC: 32.9 g/dL (ref 30.0–36.0)
MCV: 87.9 fL (ref 80.0–100.0)
Monocytes Absolute: 0.8 10*3/uL (ref 0.1–1.0)
Monocytes Relative: 9 %
Neutro Abs: 5 10*3/uL (ref 1.7–7.7)
Neutrophils Relative %: 54 %
Platelets: 253 10*3/uL (ref 150–400)
RBC: 4.46 MIL/uL (ref 3.87–5.11)
RDW: 12.8 % (ref 11.5–15.5)
WBC: 8.9 10*3/uL (ref 4.0–10.5)
nRBC: 0 % (ref 0.0–0.2)

## 2020-09-11 LAB — BASIC METABOLIC PANEL
Anion gap: 7 (ref 5–15)
BUN: 23 mg/dL (ref 8–23)
CO2: 29 mmol/L (ref 22–32)
Calcium: 9.5 mg/dL (ref 8.9–10.3)
Chloride: 103 mmol/L (ref 98–111)
Creatinine, Ser: 1.15 mg/dL — ABNORMAL HIGH (ref 0.44–1.00)
GFR, Estimated: 54 mL/min — ABNORMAL LOW (ref >60)
Glucose, Bld: 147 mg/dL — ABNORMAL HIGH (ref 70–99)
Potassium: 2.9 mmol/L — ABNORMAL LOW (ref 3.5–5.1)
Sodium: 139 mmol/L (ref 135–145)

## 2020-09-11 LAB — TSH: TSH: 0.499 u[IU]/mL (ref 0.350–4.500)

## 2020-09-11 LAB — TROPONIN I (HIGH SENSITIVITY): Troponin I (High Sensitivity): 12 ng/L (ref <18)

## 2020-09-11 MED ORDER — MECLIZINE HCL 25 MG PO TABS
25.0000 mg | ORAL_TABLET | Freq: Once | ORAL | Status: AC
  Administered 2020-09-11: 25 mg via ORAL
  Filled 2020-09-11: qty 1

## 2020-09-11 MED ORDER — POTASSIUM CHLORIDE ER 10 MEQ PO TBCR: 10.0000 meq | EXTENDED_RELEASE_TABLET | Freq: Every day | ORAL | 0 refills | Status: DC

## 2020-09-11 MED ORDER — SODIUM CHLORIDE 0.9 % IV BOLUS
1000.0000 mL | Freq: Once | INTRAVENOUS | Status: AC
  Administered 2020-09-11: 1000 mL via INTRAVENOUS

## 2020-09-11 MED ORDER — POTASSIUM CHLORIDE CRYS ER 20 MEQ PO TBCR
40.0000 meq | EXTENDED_RELEASE_TABLET | Freq: Once | ORAL | Status: AC
  Administered 2020-09-11: 40 meq via ORAL
  Filled 2020-09-11: qty 2

## 2020-09-11 NOTE — ED Triage Notes (Signed)
Patient coming from home complaint of dizziness that started Sunday, states when she stands up the dizziness gets progressively worse.

## 2020-09-11 NOTE — ED Notes (Signed)
Called lab regarding add on for troponin and urine at this time.

## 2020-09-11 NOTE — Discharge Instructions (Addendum)
Your work-up today showed that your potassium level was low.  We gave you potassium in the ER.  I also started you on potassium pills, to take once daily at home.  Please follow-up with your primary care provider and have them recheck your potassium level in 2-3 weeks.  Continue drinking plenty of water at home.  If you experience any new symptoms in the upcoming days, including chest pain or pressure, lightheadedness or feeling like passing out, clamminess or shortness of breath, severe headache, loss of vision, numbness or weakness of the arms or legs, please call 911 and return to the ER.

## 2020-09-11 NOTE — ED Provider Notes (Signed)
Care assumed from Dr. Langston Masker, patient with dizziness, pending MRI brain, also pending second troponin (not having chest pain tonight).  Repeat troponin is normal and unchanged.  MRI of the brain shows no evidence of stroke.  Patient is discharged with prescription for K-Dur, follow-up with PCP.  Results for orders placed or performed during the hospital encounter of 09/11/20  TSH  Result Value Ref Range   TSH 0.499 0.350 - 4.500 uIU/mL  Basic metabolic panel  Result Value Ref Range   Sodium 139 135 - 145 mmol/L   Potassium 2.9 (L) 3.5 - 5.1 mmol/L   Chloride 103 98 - 111 mmol/L   CO2 29 22 - 32 mmol/L   Glucose, Bld 147 (H) 70 - 99 mg/dL   BUN 23 8 - 23 mg/dL   Creatinine, Ser 1.15 (H) 0.44 - 1.00 mg/dL   Calcium 9.5 8.9 - 10.3 mg/dL   GFR, Estimated 54 (L) >60 mL/min   Anion gap 7 5 - 15  CBC with Differential  Result Value Ref Range   WBC 8.9 4.0 - 10.5 K/uL   RBC 4.46 3.87 - 5.11 MIL/uL   Hemoglobin 12.9 12.0 - 15.0 g/dL   HCT 39.2 36.0 - 46.0 %   MCV 87.9 80.0 - 100.0 fL   MCH 28.9 26.0 - 34.0 pg   MCHC 32.9 30.0 - 36.0 g/dL   RDW 12.8 11.5 - 15.5 %   Platelets 253 150 - 400 K/uL   nRBC 0.0 0.0 - 0.2 %   Neutrophils Relative % 54 %   Neutro Abs 5.0 1.7 - 7.7 K/uL   Lymphocytes Relative 33 %   Lymphs Abs 2.9 0.7 - 4.0 K/uL   Monocytes Relative 9 %   Monocytes Absolute 0.8 0.1 - 1.0 K/uL   Eosinophils Relative 2 %   Eosinophils Absolute 0.2 0.0 - 0.5 K/uL   Basophils Relative 1 %   Basophils Absolute 0.1 0.0 - 0.1 K/uL   Immature Granulocytes 1 %   Abs Immature Granulocytes 0.04 0.00 - 0.07 K/uL  Urinalysis, Routine w reflex microscopic Urine, Clean Catch  Result Value Ref Range   Color, Urine STRAW (A) YELLOW   APPearance CLEAR CLEAR   Specific Gravity, Urine 1.010 1.005 - 1.030   pH 5.0 5.0 - 8.0   Glucose, UA NEGATIVE NEGATIVE mg/dL   Hgb urine dipstick NEGATIVE NEGATIVE   Bilirubin Urine NEGATIVE NEGATIVE   Ketones, ur NEGATIVE NEGATIVE mg/dL   Protein, ur  NEGATIVE NEGATIVE mg/dL   Nitrite NEGATIVE NEGATIVE   Leukocytes,Ua NEGATIVE NEGATIVE  Troponin I (High Sensitivity)  Result Value Ref Range   Troponin I (High Sensitivity) 12 <18 ng/L  Troponin I (High Sensitivity)  Result Value Ref Range   Troponin I (High Sensitivity) 13 <18 ng/L   MR BRAIN WO CONTRAST  Result Date: 09/12/2020 CLINICAL DATA:  Dizziness EXAM: MRI HEAD WITHOUT CONTRAST TECHNIQUE: Multiplanar, multiecho pulse sequences of the brain and surrounding structures were obtained without intravenous contrast. COMPARISON:  None. FINDINGS: Brain: No acute infarct, mass effect or extra-axial collection. No acute or chronic hemorrhage. Hyperintense T2-weighted signal is moderately widespread throughout the white matter. Parenchymal volume and CSF spaces are normal. The midline structures are normal. Vascular: Major flow voids are preserved. Skull and upper cervical spine: Normal calvarium and skull base. Visualized upper cervical spine and soft tissues are normal. Sinuses/Orbits:No paranasal sinus fluid levels or advanced mucosal thickening. No mastoid or middle ear effusion. Normal orbits. IMPRESSION: 1. No acute intracranial abnormality. 2.  Findings of chronic small vessel ischemia. Electronically Signed   By: Ulyses Jarred M.D.   On: 08/18/9456 59:29      Delora Fuel, MD 24/46/28 0127

## 2020-09-11 NOTE — ED Triage Notes (Signed)
Emergency Medicine Provider Triage Evaluation Note  Penny Rice , a 65 y.o. female  was evaluated in triage.  Pt complains of dizziness.  Gradually dizziness gets worse when she is standing.  Dizziness since 09/06/20.   Balance feels off, room is not spinning.  No falls or injuries.    Review of Systems  Positive: dizziness Negative: Slurred speech, facial asymmetry, weakness, numbness,   Physical Exam  BP (!) 144/93 (BP Location: Right Arm)   Pulse 72   Temp 98.5 F (36.9 C)   Resp 16   SpO2 99%  Gen:   Awake, no distress   HEENT:  Atraumatic  Resp:  Normal effort  Cardiac:  Normal rate  MSK:   Moves extremities without difficulty  Neuro:  Speech clear, CN 2-12 intact  Medical Decision Making  Medically screening exam initiated at 6:50 PM.  Appropriate orders placed.  Devynn Hessler was informed that the remainder of the evaluation will be completed by another provider, this initial triage assessment does not replace that evaluation, and the importance of remaining in the ED until their evaluation is complete.  Clinical Impression   Dizziness/ balance issue.  The patient appears stable so that the remainder of the work up may be completed by another provider.     Loni Beckwith, [place] 09/11/20 [phone]

## 2020-09-11 NOTE — ED Provider Notes (Signed)
[facility] EMERGENCY DEPARTMENT Provider Note   CSN: [id number] Arrival date & time: 09/11/20  1815     History Chief Complaint  Patient presents with  . Dizziness    Penny Rice is a 65 y.o. female presented to ED with report of ataxia for the past 2 days.  She reports she felt worsening onset of symptoms yesterday into today.  She specifically describes that when she is up and walking around, she feels like her balance is off.  This does not happen her before.  She also feels that she is having pressure in her eyes, and has a posterior headache.  She denies any nausea, vomiting, diarrhea.  She denies any chest pain.  In the past month she has been evaluated by cardiology as an outpatient for episodes of chest pain, and had a CT coronary done, showing severe coronary calcifications.  HPI     Past Medical History:  Diagnosis Date  . Allergy   . Arthritis   . Diabetes mellitus without complication ([place])   . Hyperlipidemia   . Hypertension   . Vitamin D deficiency     Patient Active Problem List   Diagnosis Date Noted  . Atherosclerosis 09/09/2020  . Dehydration 09/09/2020  . Allergic rhinitis due to pollen 09/09/2020  . Precordial pain   . Fatigue 06/29/2020  . Right-sided chest wall pain 06/29/2020  . Dizziness 06/29/2020  . Weak 06/29/2020  . Constipation 03/11/2020  . Tendonitis of both elbows 03/11/2020  . Right sided abdominal pain 03/11/2020  . Abdominal pain 03/11/2020  . Flank pain 03/11/2020  . Diabetes mellitus ([place]) 02/05/2020  . Dyslipidemia associated with type 2 diabetes mellitus ([place]) 02/05/2020  . Screen for colon cancer 02/05/2020  . Enlarged and hypertrophic nails 02/05/2020  . Screening for heart disease 02/05/2020  . Noncompliance 02/05/2020  . Chronic left shoulder pain 01/07/2020  . Left arm pain 01/07/2020  . Arm paresthesia, left 01/07/2020  . Acute pain of left knee 03/01/2019  . Joint swelling 03/01/2019  .  Polyarthralgia 03/01/2019  . S/P hysterectomy 12/17/2018  . Vaccine counseling 12/17/2018  . Encounter for health maintenance examination in adult 12/17/2018  . Post-menopausal 12/17/2018  . Hot flashes 12/17/2018  . Acute pain of right shoulder 05/28/2018  . Decreased hearing of both ears 05/28/2018  . Need for influenza vaccination 02/09/2018  . Need for pneumococcal vaccination 02/09/2018  . Hyperlipidemia 10/25/2017  . Abnormal thyroid blood test 10/25/2017  . Vitamin D deficiency 10/25/2017  . Heel pain 10/25/2017  . BMI 32.0-32.9,adult 07/12/2017  . Hypertension 09/28/2010    Past Surgical History:  Procedure Laterality Date  . ABDOMINAL HYSTERECTOMY     pt reports full  . COLONOSCOPY  2016   Dr. Lucio Edward  . COLONOSCOPY  02/18/2020  . POLYPECTOMY       OB History   No obstetric history on file.     Family History  Problem Relation Age of Onset  . Hypertension Sister   . Diabetes Sister   . Hypertension Mother   . Other Father        "violent death"  . Cancer Son        lung  . Hypertension Sister   . Hypertension Sister   . Hypertension Sister   . Hypertension Sister   . Colon cancer Neg Hx   . Heart disease Neg Hx   . Stroke Neg Hx   . Colon polyps Neg Hx   . Rectal cancer  Neg Hx   . Stomach cancer Neg Hx     Social History   Tobacco Use  . Smoking status: Never Smoker  . Smokeless tobacco: Never Used  Vaping Use  . Vaping Use: Never used  Substance Use Topics  . Alcohol use: No    Alcohol/week: 0.0 standard drinks  . Drug use: No    Home Medications Prior to Admission medications   Medication Sig Start Date End Date Taking? Authorizing Provider  meclizine (ANTIVERT) 25 MG tablet Take 1 tablet (25 mg total) by mouth 2 (two) times daily. 09/10/20   Tysinger, Camelia Eng, PA-C  aspirin EC 81 MG tablet Take 1 tablet (81 mg total) by mouth daily. 02/17/20   Tysinger, Camelia Eng, PA-C  carvedilol (COREG) 25 MG tablet Take 1 tablet (25 mg total) by  mouth 2 (two) times daily with a meal. 09/09/20   Tysinger, Camelia Eng, PA-C  metFORMIN (GLUCOPHAGE) 500 MG tablet Take 1 tablet (500 mg total) by mouth daily with breakfast. Patient taking differently: Take 500 mg by mouth daily with breakfast. Patient taking 1/2 daily 02/17/20   Tysinger, Camelia Eng, PA-C  Olmesartan-amLODIPine-HCTZ (TRIBENZOR) 40-10-25 MG TABS Take 1 tablet by mouth daily. 09/09/20   Tysinger, Camelia Eng, PA-C  polyethylene glycol powder (GLYCOLAX/MIRALAX) 17 GM/SCOOP powder Take 17 g by mouth 2 (two) times daily as needed. Patient not taking: Reported on 09/09/2020 03/11/20   Tysinger, Camelia Eng, PA-C  rosuvastatin (CRESTOR) 20 MG tablet Take 1 tablet (20 mg total) by mouth at bedtime. TAKE 1 TABLET(10 MG) BY MOUTH AT BEDTIME 08/14/20 11/12/20  Tolia, Sunit, DO  VITAMIN D, CHOLECALCIFEROL, PO Take 25 mcg by mouth daily. 1000 IU    [provider]    Allergies    Patient has no known allergies.  Review of Systems   Review of Systems  Constitutional: Negative for chills and fever.  Eyes: Positive for visual disturbance. Negative for pain.  Respiratory: Negative for cough and shortness of breath.   Cardiovascular: Negative for chest pain and palpitations.  Gastrointestinal: Negative for abdominal pain, nausea and vomiting.  Genitourinary: Negative for dysuria and hematuria.  Musculoskeletal: Negative for arthralgias and back pain.  Skin: Negative for color change and rash.  Neurological: Positive for dizziness and light-headedness. Negative for seizures, syncope, facial asymmetry, speech difficulty and weakness.  All other systems reviewed and are negative.   Physical Exam Updated Vital Signs BP (!) 139/100   Pulse 72   Temp 98.5 F (36.9 C)   Resp (!) 24   SpO2 97%   Physical Exam Constitutional:      General: She is not in acute distress. HENT:     Head: Normocephalic and atraumatic.  Eyes:     Conjunctiva/sclera: Conjunctivae normal.     Pupils: Pupils are equal,  round, and reactive to light.  Cardiovascular:     Rate and Rhythm: Normal rate and regular rhythm.     Pulses: Normal pulses.  Pulmonary:     Effort: Pulmonary effort is normal. No respiratory distress.  Abdominal:     General: There is no distension.     Tenderness: There is no abdominal tenderness.  Skin:    General: Skin is warm and dry.  Neurological:     General: No focal deficit present.     Mental Status: She is alert and oriented to person, place, and time. Mental status is at baseline.     GCS: GCS eye subscore is 4. GCS verbal subscore is  5. GCS motor subscore is 6.     Cranial Nerves: Cranial nerves are intact.     Sensory: Sensation is intact.     Motor: Motor function is intact.     Coordination: Coordination is intact. Romberg sign negative. Finger-Nose-Finger Test normal.     Gait: Gait is intact.  Psychiatric:        Mood and Affect: Mood normal.        Behavior: Behavior normal.     ED Results / Procedures / Treatments   Labs (all labs ordered are listed, but only abnormal results are displayed) Labs Reviewed  BASIC METABOLIC PANEL - Abnormal; Notable for the following components:      Result Value   Potassium 2.9 (*)    Glucose, Bld 147 (*)    Creatinine, Ser 1.15 (*)    GFR, Estimated 54 (*)    All other components within normal limits  TSH  CBC WITH DIFFERENTIAL/PLATELET    EKG EKG Interpretation  Date/Time:  Friday September 11 2020 18:42:44 EDT Ventricular Rate:  69 PR Interval:  158 QRS Duration: 86 QT Interval:  414 QTC Calculation: 443 R Axis:   4 Text Interpretation: Normal sinus rhythm  Nonspecific T wave inversions in inferior and lateral leads, no prior tracing for comparison, no STEMI Confirmed by Octaviano Glow [phone]) on 09/11/2020 9:41:19 PM   Radiology No results found.  Procedures Procedures   Medications Ordered in ED Medications - No data to display  ED Course  I have reviewed the triage vital signs and the nursing  notes.  Pertinent labs & imaging results that were available during my care of the patient were reviewed by me and considered in my medical decision making (see chart for details).  This patient complains of vertigo, balance difficulties x 3 days. This involves an extensive number of treatment options, and is a complaint that carries with it a high risk of complications and morbidity.  The differential diagnosis includes peripheral vertigo vs posterior CVA vs electrolyte derangement vs other  Neuro exam reassuring.  However with stroke risk factors and blurred vision/balance issue reported at home, I think an MRI brain to evaluate for posterior CVA is reasonable.  The patient agrees with plan.    This is less likely atypical ACS.  She does have coronary disease, but no active chest pain.  ECG shows no acute ischemic changes from prior by my interpretation, no STEMI.  (Correction to ECG MUSE reading, I was able to find prior ECG's, and her inferior/lateral T wave inversions were noted on 07/16/20 tracing). If her delta troponins are flat and low, she can reasonably continue to follow up with her cardiologist as an outpatient.  She has had no chest pain or discomfort with these current symptoms.   I ordered, reviewed, and interpreted labs.  No life-threatening abnormalities were noted on these tests.  Mild hypoK with K 2.9.  UA without sign of infection I ordered medication K, IV fluid, meclizine I ordered imaging studies which included MRI brain Previous records obtained and reviewed showing outpatient cardiac workup   Clinical Course as of 09/12/20 0022  Fri Sep 11, 2020  2335 Pt signed out to Dr Roxanne Mins EDP, pending MRI brain to rule out posterior infarct, and repeat troponin.  If unremarkable, she can be discharged home on potassium.  She remains chest pain free at this time. [MT]    Clinical Course User Index [MT] Kamari Bilek, Carola Rhine, MD    Final Clinical Impression(s) /  ED Diagnoses Final  diagnoses:  None    Rx / DC Orders ED Discharge Orders    None       Avien Taha, Carola Rhine, MD 09/12/20 0028

## 2020-09-11 NOTE — ED Notes (Signed)
Received pt from lobby at this time per wheelchair.

## 2020-09-12 ENCOUNTER — Emergency Department (HOSPITAL_COMMUNITY): Payer: BC Managed Care – PPO

## 2020-09-12 DIAGNOSIS — R42 Dizziness and giddiness: Secondary | ICD-10-CM | POA: Diagnosis not present

## 2020-09-12 LAB — TROPONIN I (HIGH SENSITIVITY): Troponin I (High Sensitivity): 13 ng/L (ref <18)

## 2020-09-12 MED ORDER — POTASSIUM CHLORIDE CRYS ER 20 MEQ PO TBCR: EXTENDED_RELEASE_TABLET | ORAL | 0 refills | Status: DC

## 2020-09-12 NOTE — ED Notes (Signed)
Patient transported to MRI 

## 2020-09-25 ENCOUNTER — Other Ambulatory Visit: Payer: Self-pay

## 2020-09-25 ENCOUNTER — Ambulatory Visit
Admission: RE | Admit: 2020-09-25 | Discharge: 2020-09-25 | Disposition: A | Discharge status: Home / Self Care | Payer: BC Managed Care – PPO | Source: Ambulatory Visit | Attending: Medical | Admitting: Medical

## 2020-09-25 DIAGNOSIS — Z1231 Encounter for screening mammogram for malignant neoplasm of breast: Secondary | ICD-10-CM

## 2020-10-11 ENCOUNTER — Encounter (HOSPITAL_BASED_OUTPATIENT_CLINIC_OR_DEPARTMENT_OTHER): Payer: Self-pay | Admitting: Emergency Medicine

## 2020-10-11 ENCOUNTER — Other Ambulatory Visit: Payer: Self-pay

## 2020-10-11 ENCOUNTER — Emergency Department (HOSPITAL_BASED_OUTPATIENT_CLINIC_OR_DEPARTMENT_OTHER): Payer: BC Managed Care – PPO

## 2020-10-11 ENCOUNTER — Emergency Department (HOSPITAL_BASED_OUTPATIENT_CLINIC_OR_DEPARTMENT_OTHER)
Admission: EM | Admit: 2020-10-11 | Discharge: 2020-10-11 | Disposition: A | Discharge status: Home / Self Care | Payer: BC Managed Care – PPO | Attending: Emergency Medicine | Admitting: Emergency Medicine

## 2020-10-11 DIAGNOSIS — R519 Headache, unspecified: Secondary | ICD-10-CM | POA: Diagnosis not present

## 2020-10-11 DIAGNOSIS — Z7984 Long term (current) use of oral hypoglycemic drugs: Secondary | ICD-10-CM | POA: Diagnosis not present

## 2020-10-11 DIAGNOSIS — E1169 Type 2 diabetes mellitus with other specified complication: Secondary | ICD-10-CM | POA: Diagnosis not present

## 2020-10-11 DIAGNOSIS — I6529 Occlusion and stenosis of unspecified carotid artery: Secondary | ICD-10-CM | POA: Diagnosis not present

## 2020-10-11 DIAGNOSIS — Z7982 Long term (current) use of aspirin: Secondary | ICD-10-CM | POA: Diagnosis not present

## 2020-10-11 DIAGNOSIS — I6522 Occlusion and stenosis of left carotid artery: Secondary | ICD-10-CM | POA: Diagnosis not present

## 2020-10-11 DIAGNOSIS — E785 Hyperlipidemia, unspecified: Secondary | ICD-10-CM | POA: Diagnosis not present

## 2020-10-11 DIAGNOSIS — I10 Essential (primary) hypertension: Secondary | ICD-10-CM | POA: Insufficient documentation

## 2020-10-11 DIAGNOSIS — Z79899 Other long term (current) drug therapy: Secondary | ICD-10-CM | POA: Diagnosis not present

## 2020-10-11 LAB — CBC WITH DIFFERENTIAL/PLATELET
Abs Immature Granulocytes: 0.02 10*3/uL (ref 0.00–0.07)
Basophils Absolute: 0.1 10*3/uL (ref 0.0–0.1)
Basophils Relative: 1 %
Eosinophils Absolute: 0.1 10*3/uL (ref 0.0–0.5)
Eosinophils Relative: 2 %
HCT: 42 % (ref 36.0–46.0)
Hemoglobin: 13.9 g/dL (ref 12.0–15.0)
Immature Granulocytes: 0 %
Lymphocytes Relative: 19 %
Lymphs Abs: 1.5 10*3/uL (ref 0.7–4.0)
MCH: 29.3 pg (ref 26.0–34.0)
MCHC: 33.1 g/dL (ref 30.0–36.0)
MCV: 88.6 fL (ref 80.0–100.0)
Monocytes Absolute: 0.5 10*3/uL (ref 0.1–1.0)
Monocytes Relative: 6 %
Neutro Abs: 5.6 10*3/uL (ref 1.7–7.7)
Neutrophils Relative %: 72 %
Platelets: 244 10*3/uL (ref 150–400)
RBC: 4.74 MIL/uL (ref 3.87–5.11)
RDW: 13 % (ref 11.5–15.5)
WBC: 7.7 10*3/uL (ref 4.0–10.5)
nRBC: 0 % (ref 0.0–0.2)

## 2020-10-11 LAB — BASIC METABOLIC PANEL
Anion gap: 8 (ref 5–15)
BUN: 12 mg/dL (ref 8–23)
CO2: 29 mmol/L (ref 22–32)
Calcium: 10.1 mg/dL (ref 8.9–10.3)
Chloride: 103 mmol/L (ref 98–111)
Creatinine, Ser: 0.86 mg/dL (ref 0.44–1.00)
GFR, Estimated: >60 mL/min (ref >60)
Glucose, Bld: 120 mg/dL — ABNORMAL HIGH (ref 70–99)
Potassium: 3.5 mmol/L (ref 3.5–5.1)
Sodium: 140 mmol/L (ref 135–145)

## 2020-10-11 MED ORDER — GABAPENTIN 100 MG PO CAPS
400.0000 mg | ORAL_CAPSULE | Freq: Once | ORAL | Status: AC
  Administered 2020-10-11: 400 mg via ORAL
  Filled 2020-10-11: qty 4

## 2020-10-11 MED ORDER — CARBAMAZEPINE 200 MG PO TABS: 200.0000 mg | ORAL_TABLET | Freq: Two times a day (BID) | ORAL | 0 refills | Status: DC

## 2020-10-11 MED ORDER — IOHEXOL 300 MG/ML  SOLN
100.0000 mL | Freq: Once | INTRAMUSCULAR | Status: AC | PRN
  Administered 2020-10-11: 75 mL via INTRAVENOUS

## 2020-10-11 MED ORDER — KETOROLAC TROMETHAMINE 30 MG/ML IJ SOLN
30.0000 mg | Freq: Once | INTRAMUSCULAR | Status: AC
  Administered 2020-10-11 (×2): 30 mg via INTRAVENOUS
  Filled 2020-10-11: qty 1

## 2020-10-11 NOTE — Discharge Instructions (Signed)
There is an  impacted or accessory left maxillary tooth partially involving the  floor of the left maxillary sinus.

## 2020-10-11 NOTE — ED Notes (Signed)
Patient transported to CT 

## 2020-10-11 NOTE — ED Triage Notes (Signed)
Facial swelling, left side of face, a week in duration, getting worse.

## 2020-10-11 NOTE — ED Provider Notes (Signed)
[facility] EMERGENCY DEPT Provider Note   CSN: [id number] Arrival date & time: 10/11/20  [id number]     History Chief Complaint  Patient presents with  . Facial Swelling    Penny Rice is a 65 y.o. female.  The history is provided by the patient.  Dental Pain Location:  Upper Upper teeth location: right side of face. Quality:  Sharp (numb) Severity:  Severe Onset quality:  Gradual Duration:  1 week (worse since this morning) Timing:  Constant Progression:  Worsening Chronicity:  Recurrent (Has had something similar 2 times in the past) Context: normal dentition and not trauma   Relieved by:  Nothing Worsened by:  Jaw movement Ineffective treatments:  Acetaminophen Associated symptoms: facial pain and facial swelling   Associated symptoms: no congestion, no difficulty swallowing, no drooling, no fever, no headaches, no neck pain and no trismus        Past Medical History:  Diagnosis Date  . Allergy   . Arthritis   . Diabetes mellitus without complication ([place])   . Hyperlipidemia   . Hypertension   . Vitamin D deficiency     Patient Active Problem List   Diagnosis Date Noted  . Atherosclerosis 09/09/2020  . Dehydration 09/09/2020  . Allergic rhinitis due to pollen 09/09/2020  . Precordial pain   . Fatigue 06/29/2020  . Right-sided chest wall pain 06/29/2020  . Dizziness 06/29/2020  . Weak 06/29/2020  . Constipation 03/11/2020  . Tendonitis of both elbows 03/11/2020  . Right sided abdominal pain 03/11/2020  . Abdominal pain 03/11/2020  . Flank pain 03/11/2020  . Diabetes mellitus ([place]) 02/05/2020  . Dyslipidemia associated with type 2 diabetes mellitus ([place]) 02/05/2020  . Screen for colon cancer 02/05/2020  . Enlarged and hypertrophic nails 02/05/2020  . Screening for heart disease 02/05/2020  . Noncompliance 02/05/2020  . Chronic left shoulder pain 01/07/2020  . Left arm pain 01/07/2020  . Arm paresthesia, left 01/07/2020  . Acute pain of  left knee 03/01/2019  . Joint swelling 03/01/2019  . Polyarthralgia 03/01/2019  . S/P hysterectomy 12/17/2018  . Vaccine counseling 12/17/2018  . Encounter for health maintenance examination in adult 12/17/2018  . Post-menopausal 12/17/2018  . Hot flashes 12/17/2018  . Acute pain of right shoulder 05/28/2018  . Decreased hearing of both ears 05/28/2018  . Need for influenza vaccination 02/09/2018  . Need for pneumococcal vaccination 02/09/2018  . Hyperlipidemia 10/25/2017  . Abnormal thyroid blood test 10/25/2017  . Vitamin D deficiency 10/25/2017  . Heel pain 10/25/2017  . BMI 32.0-32.9,adult 07/12/2017  . Hypertension 09/28/2010    Past Surgical History:  Procedure Laterality Date  . ABDOMINAL HYSTERECTOMY     pt reports full  . COLONOSCOPY  2016   Dr. Lucio Edward  . COLONOSCOPY  02/18/2020  . POLYPECTOMY       OB History   No obstetric history on file.     Family History  Problem Relation Age of Onset  . Hypertension Sister   . Diabetes Sister   . Hypertension Mother   . Other Father        "violent death"  . Cancer Son        lung  . Hypertension Sister   . Hypertension Sister   . Hypertension Sister   . Hypertension Sister   . Colon cancer Neg Hx   . Heart disease Neg Hx   . Stroke Neg Hx   . Colon polyps Neg Hx   . Rectal cancer Neg  Hx   . Stomach cancer Neg Hx     Social History   Tobacco Use  . Smoking status: Never Smoker  . Smokeless tobacco: Never Used  Vaping Use  . Vaping Use: Never used  Substance Use Topics  . Alcohol use: No    Alcohol/week: 0.0 standard drinks  . Drug use: No    Home Medications Prior to Admission medications   Medication Sig Start Date End Date Taking? Authorizing Provider  aspirin EC 81 MG tablet Take 1 tablet (81 mg total) by mouth daily. 02/17/20  Yes Tysinger, Camelia Eng, PA-C  carvedilol (COREG) 25 MG tablet Take 1 tablet (25 mg total) by mouth 2 (two) times daily with a meal. 09/09/20  Yes Tysinger, Camelia Eng,  PA-C  meclizine (ANTIVERT) 25 MG tablet Take 1 tablet (25 mg total) by mouth 2 (two) times daily. 09/10/20  Yes Tysinger, Camelia Eng, PA-C  metFORMIN (GLUCOPHAGE) 500 MG tablet Take 1 tablet (500 mg total) by mouth daily with breakfast. Patient taking differently: Take 500 mg by mouth daily with breakfast. Patient taking 1/2 daily 02/17/20  Yes Tysinger, Camelia Eng, PA-C  Olmesartan-amLODIPine-HCTZ (TRIBENZOR) 40-10-25 MG TABS Take 1 tablet by mouth daily. 09/09/20  Yes Tysinger, Camelia Eng, PA-C  polyethylene glycol powder (GLYCOLAX/MIRALAX) 17 GM/SCOOP powder Take 17 g by mouth 2 (two) times daily as needed. 03/11/20  Yes Tysinger, Camelia Eng, PA-C  potassium chloride SA (KLOR-CON) 20 MEQ tablet Take 1 tablet twice a day for the next 5 days.  After that, take 1 tablet every day. 10/05/86  Yes Delora Fuel, MD  rosuvastatin (CRESTOR) 20 MG tablet Take 1 tablet (20 mg total) by mouth at bedtime. TAKE 1 TABLET(10 MG) BY MOUTH AT BEDTIME 08/14/20 11/12/20 Yes Tolia, Sunit, DO  VITAMIN D, CHOLECALCIFEROL, PO Take 25 mcg by mouth daily. 1000 IU   Yes [provider]  potassium chloride (KLOR-CON) 10 MEQ tablet Take 1 tablet (10 mEq total) by mouth daily for 30 doses. 09/11/20 09/12/20  Wyvonnia Dusky, MD    Allergies    Patient has no known allergies.  Review of Systems   Review of Systems  Constitutional: Negative for chills and fever.  HENT: Positive for facial swelling. Negative for congestion, drooling, ear pain, sore throat, trouble swallowing and voice change.   Eyes: Negative for pain and visual disturbance.  Respiratory: Negative for cough and shortness of breath.   Cardiovascular: Negative for chest pain and palpitations.  Gastrointestinal: Negative for abdominal pain and vomiting.  Genitourinary: Negative for dysuria and hematuria.  Musculoskeletal: Negative for arthralgias, back pain and neck pain.  Skin: Negative for color change and rash.  Neurological: Negative for dizziness, tremors, seizures,  syncope, facial asymmetry, weakness, light-headedness and headaches.  Psychiatric/Behavioral: Negative for confusion.  All other systems reviewed and are negative.   Physical Exam Updated Vital Signs BP (!) 150/110   Pulse 71   Temp 98.4 F (36.9 C) (Oral)   Resp 20   Ht 5\' 4"  (1.626 m)   Wt 83 kg   SpO2 100%   BMI 31.41 kg/m   Physical Exam Vitals and nursing note reviewed.  HENT:     Head: Normocephalic and atraumatic.     Comments: Do not detect any obvious facial swelling or asymmetry.  Certainly, all cranial nerves are intact.  She does have a mildly positive Tinel's sign over the trigeminal nerve.  No tenderness over the temporal artery.  Mild tenderness over the TMJ.  She also has  mild tenderness over her left upper gumline without obvious gingival abnormalities.    Right Ear: Tympanic membrane, ear canal and external ear normal.     Left Ear: Tympanic membrane, ear canal and external ear normal.     Nose: Nose normal.     Mouth/Throat:     Mouth: Mucous membranes are moist.  Eyes:     General: No scleral icterus.    Extraocular Movements: Extraocular movements intact.     Conjunctiva/sclera: Conjunctivae normal.     Pupils: Pupils are equal, round, and reactive to light.  Pulmonary:     Effort: Pulmonary effort is normal. No respiratory distress.  Musculoskeletal:     Cervical back: Normal range of motion. No tenderness.  Lymphadenopathy:     Cervical: No cervical adenopathy.  Skin:    General: Skin is warm and dry.  Neurological:     General: No focal deficit present.     Mental Status: She is alert.     Cranial Nerves: No cranial nerve deficit.  Psychiatric:        Mood and Affect: Mood normal.     ED Results / Procedures / Treatments   Labs (all labs ordered are listed, but only abnormal results are displayed) Labs Reviewed  BASIC METABOLIC PANEL - Abnormal; Notable for the following components:      Result Value   Glucose, Bld 120 (*)    All other  components within normal limits  CBC WITH DIFFERENTIAL/PLATELET    EKG None  Radiology CT Maxillofacial W Contrast  Result Date: 10/11/2020 CLINICAL DATA:  65 year old female with left side facial pain for 1 week and swelling for 1 day. Fifth cranial neuropathy. EXAM: CT MAXILLOFACIAL WITH CONTRAST TECHNIQUE: Multidetector CT imaging of the maxillofacial structures was performed with intravenous contrast. Multiplanar CT image reconstructions were also generated. CONTRAST:  17mL OMNIPAQUE IOHEXOL 300 MG/ML  SOLN COMPARISON:  Brain MRI 09/12/2020. FINDINGS: Osseous: Mandible is intact and normally located. There is an impacted or accessory left maxillary tooth partially involving the floor of the left maxillary sinus. No acute dental finding. No acute osseous abnormality identified. Orbits: Intact orbital walls. Globes and bilateral orbits soft tissues appears symmetric and within normal limits. Sinuses: Clear. Soft tissues: Mild thyroid enlargement with no significant nodule. Laryngeal and pharyngeal soft tissue contours are within normal limits. Parapharyngeal, retropharyngeal, sublingual, submandibular, masticator, and parotid soft tissue spaces are within normal limits. No upper cervical lymphadenopathy. No superficial soft tissue inflammation or swelling is identified. The major vascular structures in the neck and at the skull base are patent, with bilateral carotid bifurcation atherosclerosis. Unremarkable cavernous sinuses. Limited intracranial: Stable, negative. IMPRESSION: 1. Largely negative CT appearance of the face, with no acute or inflammatory process identified. 2. Cervical carotid atherosclerosis. Electronically Signed   By: Genevie Ann M.D.   On: 10/11/2020 11:57    Procedures Procedures   Medications Ordered in ED Medications  gabapentin (NEURONTIN) tablet 400 mg (has no administration in time range)  ketorolac (TORADOL) 30 MG/ML injection 30 mg (has no administration in time range)     ED Course  I have reviewed the triage vital signs and the nursing notes.  Pertinent labs & imaging results that were available during my care of the patient were reviewed by me and considered in my medical decision making (see chart for details).    MDM Rules/Calculators/A&P  Penny Rice presents with a complaint of left-sided facial swelling.  She has no visible facial swelling, but the symptoms she describes seem a little bit more neuropathic in etiology.  She does not have evidence of Bell's palsy or a central cranial nerve palsy.  I am suspicious for possible trigeminal neuralgia.  Alternatively, I was suspicious for dental pathology or TMJ arthralgias.  The patient was evaluated for evidence of soft tissue infection or other abnormality.  CT scan was largely within normal limits, but there is a curious finding of an accessory tooth or an impacted tooth that does enter the left maxillary sinus.  I wonder whether or not this could be contributing to her symptoms.  Nonetheless, there is no evidence of infection in the area.  I would like her to follow-up with dentistry.  I have started her on carbamazepine for neuropathic pain.  I would advise follow-up with her PCP for her carotid atherosclerosis and ongoing management and/or referral for this problem. Final Clinical Impression(s) / ED Diagnoses Final diagnoses:  Facial pain  Carotid atherosclerosis, unspecified laterality    Rx / DC Orders ED Discharge Orders         Ordered    carbamazepine (TEGRETOL) 200 MG tablet  2 times daily        10/11/20 1203           Arnaldo Natal, MD 10/11/20 1207

## 2020-10-17 ENCOUNTER — Other Ambulatory Visit: Payer: Self-pay | Admitting: Medical

## 2020-11-10 ENCOUNTER — Other Ambulatory Visit: Payer: Self-pay | Admitting: Cardiology

## 2020-11-10 DIAGNOSIS — I2584 Coronary atherosclerosis due to calcified coronary lesion: Secondary | ICD-10-CM

## 2020-11-10 DIAGNOSIS — I2510 Atherosclerotic heart disease of native coronary artery without angina pectoris: Secondary | ICD-10-CM

## 2020-11-27 ENCOUNTER — Ambulatory Visit: Payer: BC Managed Care – PPO | Admitting: Cardiology

## 2020-12-09 DIAGNOSIS — I2584 Coronary atherosclerosis due to calcified coronary lesion: Secondary | ICD-10-CM | POA: Diagnosis not present

## 2020-12-09 DIAGNOSIS — I2510 Atherosclerotic heart disease of native coronary artery without angina pectoris: Secondary | ICD-10-CM | POA: Diagnosis not present

## 2020-12-10 LAB — COMP. METABOLIC PANEL (12)
AST: 17 IU/L (ref 0–40)
Albumin/Globulin Ratio: 1.8 (ref 1.2–2.2)
Albumin: 4.3 g/dL (ref 3.8–4.8)
Alkaline Phosphatase: 85 IU/L (ref 44–121)
BUN/Creatinine Ratio: 20 (ref 12–28)
BUN: 17 mg/dL (ref 8–27)
Bilirubin Total: 0.4 mg/dL (ref 0.0–1.2)
Calcium: 10.5 mg/dL — ABNORMAL HIGH (ref 8.7–10.3)
Chloride: 105 mmol/L (ref 96–106)
Creatinine, Ser: 0.86 mg/dL (ref 0.57–1.00)
Globulin, Total: 2.4 g/dL (ref 1.5–4.5)
Glucose: 136 mg/dL — ABNORMAL HIGH (ref 65–99)
Potassium: 3.6 mmol/L (ref 3.5–5.2)
Sodium: 144 mmol/L (ref 134–144)
Total Protein: 6.7 g/dL (ref 6.0–8.5)
eGFR: 76 mL/min/{1.73_m2} (ref >59)

## 2020-12-10 LAB — LIPID PANEL WITH LDL/HDL RATIO
Cholesterol, Total: 188 mg/dL (ref 100–199)
HDL: 57 mg/dL (ref >39)
LDL Chol Calc (NIH): 115 mg/dL — ABNORMAL HIGH (ref 0–99)
LDL/HDL Ratio: 2 ratio (ref 0.0–3.2)
Triglycerides: 88 mg/dL (ref 0–149)
VLDL Cholesterol Cal: 16 mg/dL (ref 5–40)

## 2020-12-10 LAB — LDL CHOLESTEROL, DIRECT: LDL Direct: 117 mg/dL — ABNORMAL HIGH (ref 0–99)

## 2020-12-18 ENCOUNTER — Ambulatory Visit: Payer: BC Managed Care – PPO | Admitting: Cardiology

## 2020-12-18 ENCOUNTER — Other Ambulatory Visit: Payer: Self-pay

## 2020-12-18 ENCOUNTER — Encounter: Payer: Self-pay | Admitting: Cardiology

## 2020-12-18 VITALS — BP 136/84 | HR 66 | Temp 98.1°F | Resp 16 | Ht 64.0 in | Wt 185.6 lb

## 2020-12-18 DIAGNOSIS — E1165 Type 2 diabetes mellitus with hyperglycemia: Secondary | ICD-10-CM

## 2020-12-18 DIAGNOSIS — I2510 Atherosclerotic heart disease of native coronary artery without angina pectoris: Secondary | ICD-10-CM | POA: Diagnosis not present

## 2020-12-18 DIAGNOSIS — E6609 Other obesity due to excess calories: Secondary | ICD-10-CM

## 2020-12-18 DIAGNOSIS — I2584 Coronary atherosclerosis due to calcified coronary lesion: Secondary | ICD-10-CM

## 2020-12-18 DIAGNOSIS — E782 Mixed hyperlipidemia: Secondary | ICD-10-CM

## 2020-12-18 DIAGNOSIS — R072 Precordial pain: Secondary | ICD-10-CM | POA: Diagnosis not present

## 2020-12-18 DIAGNOSIS — I10 Essential (primary) hypertension: Secondary | ICD-10-CM

## 2020-12-18 MED ORDER — ROSUVASTATIN CALCIUM 40 MG PO TABS: 40.0000 mg | ORAL_TABLET | Freq: Every day | ORAL | 0 refills | Status: DC

## 2020-12-18 NOTE — Progress Notes (Signed)
ID:  Penny Rice, DOB 1955-06-08, MRN [id number]  PCP:  Carlena Hurl, PA-C  Cardiologist:  Rex Kras, DO, [facility] (established care 07/16/2020)  Date: 12/18/20 Last Office Visit: 08/14/2020  Chief Complaint  Patient presents with   Atherosclerosis of native coronary artery of native heart w   Hyperlipidemia   Follow-up   HPI  Penny Rice is a 65 y.o. female who presents to the office with a chief complaint of " 54-monthfollow-up for CAD and hyperlipidemia management." Patient's past medical history and cardiovascular risk factors include: Severe coronary artery calcification, atherosclerosis of the coronary arteries per coronary CTA, HTN, HLD, NIDDM, postmenopausal female, obesity due to excess calories.  She is referred to the office at the request of TCarlena Hurl PA-C for evaluation of chest pain and hypertension.  Patient was referred to the office for evaluation of chest pain.  Given her non-insulin-dependent diabetes type 2 and a 10-year estimated risk of ASCVD greater than 20% that shared decision was to proceed with coronary CTA which noted severe coronary artery calcification and nonobstructive CAD which is negative per CT FFR for any hemodynamically significant stenosis.  Thereafter the shared decision was to work on improving her modifiable cardiovascular risk factors with regards to secondary prevention.  Since last office visit patient is doing well from a cardiovascular standpoint.  She denies any chest pain or shortness of breath at rest or with effort related activities.  Patient's blood pressure has also improved with medication titration and her home SBP ranges between 120-130 mmHg.  Renal artery duplex is negative for renal artery stenosis.  No family history of premature coronary disease or sudden cardiac death.  FUNCTIONAL STATUS: Works at [employer] does heavy lifting. No structured exercise program or daily routine.   ALLERGIES: No Known  Allergies  MEDICATION LIST PRIOR TO VISIT: Current Meds  Medication Sig   aspirin EC 81 MG tablet Take 1 tablet (81 mg total) by mouth daily.   carvedilol (COREG) 25 MG tablet Take 1 tablet (25 mg total) by mouth 2 (two) times daily with a meal.   flurbiprofen (ANSAID) 100 MG tablet Take 100 mg by mouth 2 (two) times daily.   metFORMIN (GLUCOPHAGE) 500 MG tablet Take 1 tablet (500 mg total) by mouth daily with breakfast. (Patient taking differently: Take 500 mg by mouth daily with breakfast. Patient taking 1/2 daily)   Olmesartan-amLODIPine-HCTZ (TRIBENZOR) 40-10-25 MG TABS Take 1 tablet by mouth daily.   polyethylene glycol powder (GLYCOLAX/MIRALAX) 17 GM/SCOOP powder Take 17 g by mouth 2 (two) times daily as needed.   rosuvastatin (CRESTOR) 40 MG tablet Take 1 tablet (40 mg total) by mouth at bedtime.   VITAMIN D, CHOLECALCIFEROL, PO Take 25 mcg by mouth daily. 1000 IU   [DISCONTINUED] rosuvastatin (CRESTOR) 20 MG tablet TAKE 1 TABLET BY MOUTH AT BEDTIME     PAST MEDICAL HISTORY: Past Medical History:  Diagnosis Date   Allergy    Arthritis    Diabetes mellitus without complication ([place]    Hyperlipidemia    Hypertension    Vitamin D deficiency     PAST SURGICAL HISTORY: Past Surgical History:  Procedure Laterality Date   ABDOMINAL HYSTERECTOMY     pt reports full   COLONOSCOPY  2016   Dr. MLucio Edward  COLONOSCOPY  02/18/2020   POLYPECTOMY      FAMILY HISTORY: The patient family history includes Cancer in her son; Diabetes in her sister; Hypertension in her mother, sister,  sister, sister, sister, and sister; Other in her father.  SOCIAL HISTORY:  The patient  reports that she has never smoked. She has never used smokeless tobacco. She reports that she does not drink alcohol and does not use drugs.  REVIEW OF SYSTEMS: Review of Systems  Constitutional: Negative for chills and fever.  HENT:  Negative for hoarse voice and nosebleeds.   Eyes:  Negative for discharge,  double vision and pain.  Cardiovascular:  Negative for chest pain, claudication, dyspnea on exertion, leg swelling, near-syncope, orthopnea, palpitations, paroxysmal nocturnal dyspnea and syncope.  Respiratory:  Negative for hemoptysis and shortness of breath.   Musculoskeletal:  Negative for muscle cramps and myalgias.  Gastrointestinal:  Negative for abdominal pain, constipation, diarrhea, hematemesis, hematochezia, melena, nausea and vomiting.  Neurological:  Negative for dizziness and light-headedness.   PHYSICAL EXAM: Vitals with BMI 12/18/2020 10/11/2020 10/11/2020  Height '5\' 4"'  - -  Weight 185 lbs 10 oz - -  BMI 92.11 - -  Systolic 941 740 814  Diastolic 84 98 94  Pulse 66 100 64    CONSTITUTIONAL: Well-developed and well-nourished. No acute distress.  SKIN: Skin is warm and dry. No rash noted. No cyanosis. No pallor. No jaundice HEAD: Normocephalic and atraumatic.  EYES: No scleral icterus MOUTH/THROAT: Moist oral membranes.  NECK: No JVD present. No thyromegaly noted. No carotid bruits  LYMPHATIC: No visible cervical adenopathy.  CHEST Normal respiratory effort. No intercostal retractions  LUNGS: Clear to auscultation bilaterally.  No stridor. No wheezes. No rales.  CARDIOVASCULAR: Regular rate and rhythm, positive S1-S2, no murmurs rubs or gallops appreciated. ABDOMINAL: No apparent ascites.  EXTREMITIES: No peripheral edema  HEMATOLOGIC: No significant bruising NEUROLOGIC: Oriented to person, place, and time. Nonfocal. Normal muscle tone.  PSYCHIATRIC: Normal mood and affect. Normal behavior. Cooperative  CARDIAC DATABASE: EKG: 07/16/2020: Normal sinus rhythm, 64 bpm, PRWP, nonspecific T wave abnormality.   Echocardiogram: 07/30/2020:  Left ventricle cavity is normal in size. Mild concentric hypertrophy of the left ventricle. Normal global wall motion. Normal LV systolic function with EF 55%. Doppler evidence of grade I diastolic dysfunction, normal LAP.  Left atrial  cavity is mildly dilated. Mild (Grade I) mitral regurgitation. Mild tricuspid regurgitation.  No evidence of pulmonary hypertension.  Stress Testing: No results found for this or any previous visit from the past 1095 days.  Heart Catheterization: None  Renal artery duplex  07/30/2020:  No evidence of renal artery occlusive disease in either renal artery. Normal intrarenal vascular perfusion is noted in both kidneys. Renal length is within normal limits for both kidneys. Normal abdominal aorta flow velocities noted. No significant atherosclerotic plaque noted in the abdominal aorta.   Coronary CTA 08/11/2020:  1. Total coronary calcium score of 1404. This was 99th percentile for age and sex matched control. 2. Normal coronary origin with right dominance. 3. CAD-RADS 4. Approximately 70 percent stenosis in the proximal LAD due to mixed plaque. Ramus intermedius overall patent with diffuse calcified and noncalcified plaque. Moderate stenosis (50-69%) at the ostial CX due to calcified plaque. Diffuse calcified and noncalcified plaque within the proximal to mid RCA. Mild stenosis (25-49%) in distal RCA due to mixed plaque. 4. Aortic atherosclerosis within the ascending and the descending thoracic aorta. CT FFR analysis showed no significant stenosis in the proximal LAD or LCx distribution.  LABORATORY DATA: CBC Latest Ref Rng & Units 10/11/2020 09/11/2020 02/05/2020  WBC 4.0 - 10.5 K/uL 7.7 8.9 8.1  Hemoglobin 12.0 - 15.0 g/dL 13.9 12.9 12.3  Hematocrit 36.0 - 46.0 % 42.0 39.2 36.5  Platelets 150 - 400 K/uL 244 253 262    CMP Latest Ref Rng & Units 12/09/2020 10/11/2020 09/11/2020  Glucose 65 - 99 mg/dL 136(H) 120(H) 147(H)  BUN 8 - 27 mg/dL '17 12 23  ' Creatinine 0.57 - 1.00 mg/dL 0.86 0.86 1.15(H)  Sodium 134 - 144 mmol/L 144 140 139  Potassium 3.5 - 5.2 mmol/L 3.6 3.5 2.9(L)  Chloride 96 - 106 mmol/L 105 103 103  CO2 22 - 32 mmol/L - 29 29  Calcium 8.7 - 10.3 mg/dL 10.5(H) 10.1 9.5  Total  Protein 6.0 - 8.5 g/dL 6.7 - -  Total Bilirubin 0.0 - 1.2 mg/dL 0.4 - -  Alkaline Phos 44 - 121 IU/L 85 - -  AST 0 - 40 IU/L 17 - -  ALT 0 - 32 IU/L - - -    Lipid Panel     Component Value Date/Time   CHOL 188 12/09/2020 1117   TRIG 88 12/09/2020 1117   HDL 57 12/09/2020 1117   CHOLHDL 4.1 02/05/2020 1104   LDLCALC 115 (H) 12/09/2020 1117   LDLDIRECT 117 (H) 12/09/2020 1119   LABVLDL 16 12/09/2020 1117    No components found for: NTPROBNP No results for input(s): PROBNP in the last 8760 hours. Recent Labs    02/05/20 1106 09/11/20 1857  TSH 0.498 0.499    BMP Recent Labs    02/05/20 1104 07/23/20 1559 09/11/20 1857 10/11/20 1022 12/09/20 1125  NA 141 144 139 140 144  K 3.5 3.5 2.9* 3.5 3.6  CL 101 103 103 103 105  CO2 '26 25 29 29  ' --   GLUCOSE 137* 112* 147* 120* 136*  BUN '19 19 23 12 17  ' CREATININE 1.01* 0.82 1.15* 0.86 0.86  CALCIUM 10.1 10.7* 9.5 10.1 10.5*  GFRNONAA 60 76 54* >60  --   GFRAA 69 88  --   --   --     HEMOGLOBIN A1C Lab Results  Component Value Date   HGBA1C 7.3 (A) 09/09/2020    IMPRESSION:    ICD-10-CM   1. Atherosclerosis of native coronary artery of native heart without angina pectoris  I25.10 rosuvastatin (CRESTOR) 40 MG tablet    2. Coronary atherosclerosis due to calcified coronary lesion of native artery  I25.10    I25.84     3. Precordial pain  R07.2     4. Benign hypertension  I10     5. Mixed hyperlipidemia  E78.2 rosuvastatin (CRESTOR) 40 MG tablet    Lipid Panel With LDL/HDL Ratio    LDL cholesterol, direct    CMP14+EGFR    6. Type 2 diabetes mellitus with hyperglycemia, without long-term current use of insulin (HCC)  E11.65 rosuvastatin (CRESTOR) 40 MG tablet    7. Class 1 obesity due to excess calories with serious comorbidity and body mass index (BMI) of 32.0 to 32.9 in adult  E66.09    Z68.32        RECOMMENDATIONS: Penny Rice is a 65 y.o. female whose past medical history and cardiac risk factors  include: Severe coronary artery calcification, atherosclerosis of the coronary arteries per coronary CTA, HTN, HLD, NIDDM, postmenopausal female, obesity due to excess calories.  Atherosclerosis of native coronary artery of native heart without angina pectoris Noted to have severe coronary artery calcification on coronary CTA. Noted to have a CAD RADS 4 disease burden as per the most recent coronary CTA and CT FFR negative for hemodynamically significant  stenosis. Clinically denies angina pectoris. Continue aspirin and statin therapy. She is working on improving her modifiable cardiovascular risk factors. Most recent lipid profile from July 2022 reviewed.  LDL is 117 mg/dL, therefore will increase Crestor to 40 mg p.o. nightly. Encouraged her to increase physical activity 30 minutes a day 5 days a week.  Benign hypertension Office blood pressure is within acceptable range but not at goal. Goal blood pressure would be around 130/80. Continue current medical therapy. Increase physical activity as tolerated to 30 minutes a day 5 days a week. Low-salt diet recommended. She is asked to keep a log of her blood pressures and to bring it in at the next visit.  Mixed hyperlipidemia Most recent labs from July 2022 independently reviewed and noted above for further reference. LDL is currently not at goal. Will increase statin therapy as discussed above. Will check fasting lipid profile and CMP prior to the next office visit. She does not endorse myalgias.  Type 2 diabetes mellitus with hyperglycemia, without long-term current use of insulin ([place]) Educated on the importance of glycemic control. Currently managed by primary care provider.  Class 1 obesity due to excess calories with serious comorbidity and body mass index (BMI) of 32.0 to 32.9 in adult Body mass index is 31.86 kg/m.  I reviewed with the patient the importance of diet, regular physical activity/exercise, weight loss.   Patient is  educated on increasing physical activity gradually as tolerated.  With the goal of moderate intensity exercise for 30 minutes a day 5 days a week.  FINAL MEDICATION LIST END OF ENCOUNTER: Meds ordered this encounter  Medications   rosuvastatin (CRESTOR) 40 MG tablet    Sig: Take 1 tablet (40 mg total) by mouth at bedtime.    Dispense:  90 tablet    Refill:  0     Current Outpatient Medications:    aspirin EC 81 MG tablet, Take 1 tablet (81 mg total) by mouth daily., Disp: 90 tablet, Rfl: 3   carvedilol (COREG) 25 MG tablet, Take 1 tablet (25 mg total) by mouth 2 (two) times daily with a meal., Disp: 180 tablet, Rfl: 3   flurbiprofen (ANSAID) 100 MG tablet, Take 100 mg by mouth 2 (two) times daily., Disp: , Rfl:    metFORMIN (GLUCOPHAGE) 500 MG tablet, Take 1 tablet (500 mg total) by mouth daily with breakfast. (Patient taking differently: Take 500 mg by mouth daily with breakfast. Patient taking 1/2 daily), Disp: 90 tablet, Rfl: 1   Olmesartan-amLODIPine-HCTZ (TRIBENZOR) 40-10-25 MG TABS, Take 1 tablet by mouth daily., Disp: 90 tablet, Rfl: 1   polyethylene glycol powder (GLYCOLAX/MIRALAX) 17 GM/SCOOP powder, Take 17 g by mouth 2 (two) times daily as needed., Disp: 116 g, Rfl: 0   rosuvastatin (CRESTOR) 40 MG tablet, Take 1 tablet (40 mg total) by mouth at bedtime., Disp: 90 tablet, Rfl: 0   VITAMIN D, CHOLECALCIFEROL, PO, Take 25 mcg by mouth daily. 1000 IU, Disp: , Rfl:   Orders Placed This Encounter  Procedures   Lipid Panel With LDL/HDL Ratio   LDL cholesterol, direct   CMP14+EGFR    There are no Patient Instructions on file for this visit.   --Continue cardiac medications as reconciled in final medication list. --Return in about 7 weeks (around 02/05/2021) for Follow up, Lipid. Or sooner if needed. --Continue follow-up with your primary care physician regarding the management of your other chronic comorbid conditions.  Patient's questions and concerns were addressed to her  satisfaction. She voices  understanding of the instructions provided during this encounter.   This note was created using a voice recognition software as a result there may be grammatical errors inadvertently enclosed that do not reflect the nature of this encounter. Every attempt is made to correct such errors.  Rex Kras, [place], [facility]  Pager: [phone] Office: [phone]

## 2020-12-22 ENCOUNTER — Other Ambulatory Visit: Payer: Self-pay

## 2020-12-22 DIAGNOSIS — E782 Mixed hyperlipidemia: Secondary | ICD-10-CM

## 2021-01-28 DIAGNOSIS — E782 Mixed hyperlipidemia: Secondary | ICD-10-CM | POA: Diagnosis not present

## 2021-01-29 LAB — CMP14+EGFR
ALT: 24 IU/L (ref 0–32)
AST: 17 IU/L (ref 0–40)
Albumin/Globulin Ratio: 1.7 (ref 1.2–2.2)
Albumin: 4.4 g/dL (ref 3.8–4.8)
Alkaline Phosphatase: 75 IU/L (ref 44–121)
BUN/Creatinine Ratio: 22 (ref 12–28)
BUN: 21 mg/dL (ref 8–27)
Bilirubin Total: 0.3 mg/dL (ref 0.0–1.2)
CO2: 25 mmol/L (ref 20–29)
Calcium: 10.5 mg/dL — ABNORMAL HIGH (ref 8.7–10.3)
Chloride: 106 mmol/L (ref 96–106)
Creatinine, Ser: 0.94 mg/dL (ref 0.57–1.00)
Globulin, Total: 2.6 g/dL (ref 1.5–4.5)
Glucose: 145 mg/dL — ABNORMAL HIGH (ref 65–99)
Potassium: 4 mmol/L (ref 3.5–5.2)
Sodium: 145 mmol/L — ABNORMAL HIGH (ref 134–144)
Total Protein: 7 g/dL (ref 6.0–8.5)
eGFR: 68 mL/min/{1.73_m2} (ref >59)

## 2021-01-29 LAB — LIPID PANEL WITH LDL/HDL RATIO
Cholesterol, Total: 153 mg/dL (ref 100–199)
HDL: 52 mg/dL (ref >39)
LDL Chol Calc (NIH): 79 mg/dL (ref 0–99)
LDL/HDL Ratio: 1.5 ratio (ref 0.0–3.2)
Triglycerides: 125 mg/dL (ref 0–149)
VLDL Cholesterol Cal: 22 mg/dL (ref 5–40)

## 2021-01-29 LAB — LDL CHOLESTEROL, DIRECT: LDL Direct: 73 mg/dL (ref 0–99)

## 2021-02-01 NOTE — Progress Notes (Signed)
Called pt to inform her about her lab results. Transferred pt to front desk to set up an appt.

## 2021-02-02 ENCOUNTER — Encounter: Payer: Self-pay | Admitting: Cardiology

## 2021-02-02 ENCOUNTER — Other Ambulatory Visit: Payer: Self-pay

## 2021-02-02 ENCOUNTER — Ambulatory Visit: Payer: BC Managed Care – PPO | Admitting: Cardiology

## 2021-02-02 VITALS — BP 134/83 | HR 74 | Temp 97.5°F | Resp 17 | Ht 64.0 in | Wt 184.0 lb

## 2021-02-02 DIAGNOSIS — I2510 Atherosclerotic heart disease of native coronary artery without angina pectoris: Secondary | ICD-10-CM

## 2021-02-02 DIAGNOSIS — E782 Mixed hyperlipidemia: Secondary | ICD-10-CM | POA: Diagnosis not present

## 2021-02-02 DIAGNOSIS — R072 Precordial pain: Secondary | ICD-10-CM | POA: Diagnosis not present

## 2021-02-02 DIAGNOSIS — E6609 Other obesity due to excess calories: Secondary | ICD-10-CM

## 2021-02-02 DIAGNOSIS — E1165 Type 2 diabetes mellitus with hyperglycemia: Secondary | ICD-10-CM

## 2021-02-02 DIAGNOSIS — I2584 Coronary atherosclerosis due to calcified coronary lesion: Secondary | ICD-10-CM

## 2021-02-02 DIAGNOSIS — I10 Essential (primary) hypertension: Secondary | ICD-10-CM | POA: Diagnosis not present

## 2021-02-02 NOTE — Progress Notes (Signed)
ID:  Penny Rice, DOB April 20, 1956, MRN [id number]  PCP:  Carlena Hurl, PA-C  Cardiologist:  Rex Kras, DO, [facility] (established care 07/16/2020)  Date: 02/02/21 Last Office Visit: 12/18/2020   Chief Complaint  Patient presents with   Hyperlipidemia   Follow-up   HPI  Penny Rice is a 65 y.o. female who presents to the office with a chief complaint of " 1 month follow-up for CAD and hyperlipidemia." Patient's past medical history and cardiovascular risk factors include: Severe coronary artery calcification, atherosclerosis of the coronary arteries per coronary CTA, HTN, HLD, NIDDM, postmenopausal female, obesity due to excess calories.  She is referred to the office at the request of Carlena Hurl, PA-C for evaluation of chest pain and hypertension.  Given her symptoms of precordial pain, non-insulin-dependent diabetes mellitus type 2, and elevated 10-year risk of ASCVD greater than 20% during prior office visit the shared decision was to proceed with coronary CTA which noted severe coronary artery calcification and nonobstructive CAD with a negative CT FFR study.  Thereafter the shared decision was to proceed improving her modifiable cardiovascular risk factors.  Patient's blood pressure medications have been uptitrated and her systolic blood pressures have improved since establishing care.  Renal duplex negative for renal artery stenosis.  Since last office visit patient had her fasting lipid profile which notes significant improvement in LDL.  She denies any chest pain or shortness of breath at rest or with effort related activities.  No family history of premature coronary disease or sudden cardiac death.  FUNCTIONAL STATUS: Works at [employer] and does heavy lifting. No structured exercise program or daily routine.   ALLERGIES: No Known Allergies  MEDICATION LIST PRIOR TO VISIT: Current Meds  Medication Sig   aspirin EC 81 MG tablet Take 1 tablet (81 mg total) by mouth  daily.   carvedilol (COREG) 25 MG tablet Take 1 tablet (25 mg total) by mouth 2 (two) times daily with a meal.   Olmesartan-amLODIPine-HCTZ (TRIBENZOR) 40-10-25 MG TABS Take 1 tablet by mouth daily.   polyethylene glycol powder (GLYCOLAX/MIRALAX) 17 GM/SCOOP powder Take 17 g by mouth 2 (two) times daily as needed.   rosuvastatin (CRESTOR) 40 MG tablet Take 1 tablet (40 mg total) by mouth at bedtime.   VITAMIN D, CHOLECALCIFEROL, PO Take 25 mcg by mouth daily. 1000 IU     PAST MEDICAL HISTORY: Past Medical History:  Diagnosis Date   Allergy    Arthritis    Diabetes mellitus without complication ([place])    Hyperlipidemia    Hypertension    Vitamin D deficiency     PAST SURGICAL HISTORY: Past Surgical History:  Procedure Laterality Date   ABDOMINAL HYSTERECTOMY     pt reports full   COLONOSCOPY  2016   Dr. Lucio Edward   COLONOSCOPY  02/18/2020   POLYPECTOMY      FAMILY HISTORY: The patient family history includes Cancer in her son; Diabetes in her sister; Hypertension in her mother, sister, sister, sister, sister, and sister; Other in her father.  SOCIAL HISTORY:  The patient  reports that she has never smoked. She has never used smokeless tobacco. She reports that she does not drink alcohol and does not use drugs.  REVIEW OF SYSTEMS: Review of Systems  Constitutional: Negative for chills and fever.  HENT:  Negative for hoarse voice and nosebleeds.   Eyes:  Negative for discharge, double vision and pain.  Cardiovascular:  Negative for chest pain, claudication, dyspnea on exertion, leg  swelling, near-syncope, orthopnea, palpitations, paroxysmal nocturnal dyspnea and syncope.  Respiratory:  Negative for hemoptysis and shortness of breath.   Musculoskeletal:  Negative for muscle cramps and myalgias.  Gastrointestinal:  Negative for abdominal pain, constipation, diarrhea, hematemesis, hematochezia, melena, nausea and vomiting.  Neurological:  Negative for dizziness and  light-headedness.   PHYSICAL EXAM: Vitals with BMI 02/02/2021 12/18/2020 10/11/2020  Height '5\' 4"'$  '5\' 4"'$  -  Weight 184 lbs 185 lbs 10 oz -  BMI [id number] -  Systolic [id number]  Diastolic 83 84 98  Pulse 74 66 100    CONSTITUTIONAL: Well-developed and well-nourished. No acute distress.  SKIN: Skin is warm and dry. No rash noted. No cyanosis. No pallor. No jaundice HEAD: Normocephalic and atraumatic.  EYES: No scleral icterus MOUTH/THROAT: Moist oral membranes.  NECK: No JVD present. No thyromegaly noted. No carotid bruits  LYMPHATIC: No visible cervical adenopathy.  CHEST Normal respiratory effort. No intercostal retractions  LUNGS: Clear to auscultation bilaterally.  No stridor. No wheezes. No rales.  CARDIOVASCULAR: Regular rate and rhythm, positive S1-S2, no murmurs rubs or gallops appreciated. ABDOMINAL: No apparent ascites.  EXTREMITIES: No peripheral edema  HEMATOLOGIC: No significant bruising NEUROLOGIC: Oriented to person, place, and time. Nonfocal. Normal muscle tone.  PSYCHIATRIC: Normal mood and affect. Normal behavior. Cooperative No significant change in physical examination.  CARDIAC DATABASE: EKG: 07/16/2020: Normal sinus rhythm, 64 bpm, PRWP, nonspecific T wave abnormality.   Echocardiogram: 07/30/2020:  Left ventricle cavity is normal in size. Mild concentric hypertrophy of the left ventricle. Normal global wall motion. Normal LV systolic function with EF 55%. Doppler evidence of grade I diastolic dysfunction, normal LAP.  Left atrial cavity is mildly dilated. Mild (Grade I) mitral regurgitation. Mild tricuspid regurgitation.  No evidence of pulmonary hypertension.  Stress Testing: No results found for this or any previous visit from the past 1095 days.  Heart Catheterization: None  Renal artery duplex  07/30/2020:  No evidence of renal artery occlusive disease in either renal artery. Normal intrarenal vascular perfusion is noted in both kidneys. Renal  length is within normal limits for both kidneys. Normal abdominal aorta flow velocities noted. No significant atherosclerotic plaque noted in the abdominal aorta.   Coronary CTA 08/11/2020:  1. Total coronary calcium score of 1404. This was 99th percentile for age and sex matched control. 2. Normal coronary origin with right dominance. 3. CAD-RADS 4. Approximately 70 percent stenosis in the proximal LAD due to mixed plaque. Ramus intermedius overall patent with diffuse calcified and noncalcified plaque. Moderate stenosis (50-69%) at the ostial CX due to calcified plaque. Diffuse calcified and noncalcified plaque within the proximal to mid RCA. Mild stenosis (25-49%) in distal RCA due to mixed plaque. 4. Aortic atherosclerosis within the ascending and the descending thoracic aorta. CT FFR analysis showed no significant stenosis in the proximal LAD or LCx distribution.  LABORATORY DATA: CBC Latest Ref Rng & Units 10/11/2020 09/11/2020 02/05/2020  WBC 4.0 - 10.5 K/uL 7.7 8.9 8.1  Hemoglobin 12.0 - 15.0 g/dL 13.9 12.9 12.3  Hematocrit 36.0 - 46.0 % 42.0 39.2 36.5  Platelets 150 - 400 K/uL 244 253 262    CMP Latest Ref Rng & Units 01/28/2021 12/09/2020 10/11/2020  Glucose 65 - 99 mg/dL 145(H) 136(H) 120(H)  BUN 8 - 27 mg/dL '21 17 12  '$ Creatinine 0.57 - 1.00 mg/dL 0.94 0.86 0.86  Sodium 134 - 144 mmol/L 145(H) 144 140  Potassium 3.5 - 5.2 mmol/L 4.0 3.6 3.5  Chloride 96 - 106  mmol/L 106 105 103  CO2 20 - 29 mmol/L 25 - 29  Calcium 8.7 - 10.3 mg/dL 10.5(H) 10.5(H) 10.1  Total Protein 6.0 - 8.5 g/dL 7.0 6.7 -  Total Bilirubin 0.0 - 1.2 mg/dL 0.3 0.4 -  Alkaline Phos 44 - 121 IU/L 75 85 -  AST 0 - 40 IU/L 17 17 -  ALT 0 - 32 IU/L 24 - -    Lipid Panel     Component Value Date/Time   CHOL 153 01/28/2021 0938   TRIG 125 01/28/2021 0938   HDL 52 01/28/2021 0938   CHOLHDL 4.1 02/05/2020 1104   LDLCALC 79 01/28/2021 0938   LDLDIRECT 73 01/28/2021 0939   LABVLDL 22 01/28/2021 0938    No components  found for: NTPROBNP No results for input(s): PROBNP in the last 8760 hours. Recent Labs    02/05/20 1106 09/11/20 1857  TSH 0.498 0.499    BMP Recent Labs    02/05/20 1104 07/23/20 1559 07/23/20 1559 09/11/20 1857 10/11/20 1022 12/09/20 1125 01/28/21 0938  NA 141 144   < > 139 140 144 145*  K 3.5 3.5  --  2.9* 3.5 3.6 4.0  CL 101 103  --  103 103 105 106  CO2 26 25  --  29 29  --  25  GLUCOSE 137* 112*   < > 147* 120* 136* 145*  BUN 19 19   < > '23 12 17 21  '$ CREATININE 1.01* 0.82  --  1.15* 0.86 0.86 0.94  CALCIUM 10.1 10.7*  --  9.5 10.1 10.5* 10.5*  GFRNONAA 60 76  --  54* >60  --   --   GFRAA 69 88  --   --   --   --   --    < > = values in this interval not displayed.    HEMOGLOBIN A1C Lab Results  Component Value Date   HGBA1C 7.3 (A) 09/09/2020    IMPRESSION:    ICD-10-CM   1. Atherosclerosis of native coronary artery of native heart without angina pectoris  I25.10     2. Coronary atherosclerosis due to calcified coronary lesion of native artery  I25.10    I25.84     3. Precordial pain  R07.2     4. Benign hypertension  I10     5. Mixed hyperlipidemia  E78.2     6. Type 2 diabetes mellitus with hyperglycemia, without long-term current use of insulin (HCC)  E11.65     7. Class 1 obesity due to excess calories with serious comorbidity and body mass index (BMI) of 31.0 to 31.9 in adult  E66.09    Z68.31        RECOMMENDATIONS: ELLIZA DENNLER is a 65 y.o. female whose past medical history and cardiac risk factors include: Severe coronary artery calcification, atherosclerosis of the coronary arteries per coronary CTA, HTN, HLD, NIDDM, postmenopausal female, obesity due to excess calories.  Atherosclerosis of native coronary artery of native heart without angina pectoris Total coronary calcium score 1404AU Noted to have a CAD RADS 4 disease burden as per the most recent coronary CTA and CT FFR negative for hemodynamically significant stenosis. Patient  remains chest pain-free. Continue aspirin and statin therapy. Since last office visit patient's lipid profile has improved on the current statin dose. Educated on secondary prevention by improving her modifiable cardiovascular risk factors. Encouraged her to increase physical activity 30 minutes a day 5 days a week.  Benign hypertension Office blood  pressure is within acceptable range. Home blood pressures have improved since establishing care. Low-salt diet recommended. Unable to accurately reconcile medications at today's encounter as the patient did not bring her medication bottles or an accurate medication list with her. Patient states that during the midday she sometimes gets tired and fatigued.  I believe that this may be circumvented by changing her Tribenzor to 3 different tablets so that some antihypertensive medications can be taken in the morning and some during the evening hours.  I will defer this to PCP who is original prescriber. Patient will call back to reconcile her medications.  Mixed hyperlipidemia Prior LDL levels were 117 mg/dL.  Most recent lipid profile notes direct LDL of 73 mg/dL.   She does not endorse myalgias. Most recent labs from 01/28/2021 independently reviewed.  Type 2 diabetes mellitus with hyperglycemia, without long-term current use of insulin ([place]) Educated on the importance of glycemic control. Currently managed by primary care provider.  Class 1 obesity due to excess calories with serious comorbidity and body mass index (BMI) of 31.0 to 31.9 in adult Body mass index is 31.58 kg/m.  Patient has lost weight since last office visit for which she is congratulated for during today's encounter. I reviewed with the patient the importance of diet, regular physical activity/exercise, weight loss.   Patient is educated on increasing physical activity gradually as tolerated.  With the goal of moderate intensity exercise for 30 minutes a day 5 days a  week.  Patient remains relatively stable from a cardiovascular standpoint.  I would like to see her back in 6 months for follow-up given her atherosclerosis of the native coronary arteries and multiple cardiovascular risk factors.  She is encouraged to follow-up with PCP for management of her chronic comorbid conditions.  FINAL MEDICATION LIST END OF ENCOUNTER: No orders of the defined types were placed in this encounter.    Current Outpatient Medications:    aspirin EC 81 MG tablet, Take 1 tablet (81 mg total) by mouth daily., Disp: 90 tablet, Rfl: 3   carvedilol (COREG) 25 MG tablet, Take 1 tablet (25 mg total) by mouth 2 (two) times daily with a meal., Disp: 180 tablet, Rfl: 3   Olmesartan-amLODIPine-HCTZ (TRIBENZOR) 40-10-25 MG TABS, Take 1 tablet by mouth daily., Disp: 90 tablet, Rfl: 1   polyethylene glycol powder (GLYCOLAX/MIRALAX) 17 GM/SCOOP powder, Take 17 g by mouth 2 (two) times daily as needed., Disp: 116 g, Rfl: 0   rosuvastatin (CRESTOR) 40 MG tablet, Take 1 tablet (40 mg total) by mouth at bedtime., Disp: 90 tablet, Rfl: 0   VITAMIN D, CHOLECALCIFEROL, PO, Take 25 mcg by mouth daily. 1000 IU, Disp: , Rfl:    metFORMIN (GLUCOPHAGE) 500 MG tablet, Take 1 tablet (500 mg total) by mouth daily with breakfast. (Patient taking differently: Take 500 mg by mouth daily with breakfast. Patient taking 1/2 daily), Disp: 90 tablet, Rfl: 1  No orders of the defined types were placed in this encounter.  There are no Patient Instructions on file for this visit.   --Continue cardiac medications as reconciled in final medication list. --Return in about 6 months (around 08/03/2021) for Follow up, Coronary artery calcification. Or sooner if needed. --Continue follow-up with your primary care physician regarding the management of your other chronic comorbid conditions.  Patient's questions and concerns were addressed to her satisfaction. She voices understanding of the instructions provided during  this encounter.   This note was created using a voice recognition software as a  result there may be grammatical errors inadvertently enclosed that do not reflect the nature of this encounter. Every attempt is made to correct such errors.  Rex Kras, [place], [facility]  Pager: [phone] Office: [phone]

## 2021-02-05 ENCOUNTER — Ambulatory Visit: Payer: BC Managed Care – PPO | Admitting: Cardiology

## 2021-03-29 ENCOUNTER — Ambulatory Visit (INDEPENDENT_AMBULATORY_CARE_PROVIDER_SITE_OTHER): Payer: BC Managed Care – PPO | Admitting: Medical

## 2021-03-29 ENCOUNTER — Other Ambulatory Visit: Payer: Self-pay

## 2021-03-29 VITALS — BP 140/80 | HR 65 | Temp 97.1°F | Wt 185.4 lb

## 2021-03-29 DIAGNOSIS — Z23 Encounter for immunization: Secondary | ICD-10-CM | POA: Diagnosis not present

## 2021-03-29 DIAGNOSIS — I10 Essential (primary) hypertension: Secondary | ICD-10-CM | POA: Diagnosis not present

## 2021-03-29 DIAGNOSIS — R5383 Other fatigue: Secondary | ICD-10-CM

## 2021-03-29 DIAGNOSIS — E782 Mixed hyperlipidemia: Secondary | ICD-10-CM

## 2021-03-29 DIAGNOSIS — E1165 Type 2 diabetes mellitus with hyperglycemia: Secondary | ICD-10-CM | POA: Diagnosis not present

## 2021-03-29 NOTE — Patient Instructions (Addendum)
Given your ongoing concerns about dizziness and not feeling well, let us try the following:  Get a blood pressure cuff and start checking your blood pressures twice daily and check it anytime you do not feel good The goal is 120/70 To low blood pressure would be something like 100/60 or lower Too high would be anything over 140/90  Start using your blood sugar tester, glucometer Check before breakfast and before meals or either 2 hours after needed Try to check at least twice daily Morning fasting goal is less than 130 sugar Anything over 200 is too high Anything less than 70 is too low  Try using Carvedilol/Coreg 1/2 tablet twice daily for the next week  Lets see if that makes any difference on how you feel

## 2021-03-29 NOTE — Progress Notes (Signed)
Subjective:  Penny Rice is a 65 y.o. female who presents for Chief Complaint  Patient presents with   med check    Med check - no concerns. Some of the medication is causing her to feel woozy so she stopped them and it goot better but then she started back on them and she felt bad again like causing stomach pain. Would like flu shot today       Here for concerns about medications.    She has hx/o hypertension, hyperlipidemia, vit D deficiency, diabetes.   She notes at times feeling bad.  In recent months she stopped her medications then restarted them.   When off medicaiton she feels better.  She doesn't know which medication is causing her problems.    Not checking sugars or BPs.  She does have a glucometer.     No chest pain, dyspnea, no numbness, no tingling, no headaches.  At times can get dizzy.   Gets some stomach ache from time to time.  No diarrhea.    No other aggravating or relieving factors.    No other c/o.  Past Medical History:  Diagnosis Date   Allergy    Arthritis    Diabetes mellitus without complication ([place])    Hyperlipidemia    Hypertension    Vitamin D deficiency    Current Outpatient Medications on File Prior to Visit  Medication Sig Dispense Refill   aspirin EC 81 MG tablet Take 1 tablet (81 mg total) by mouth daily. 90 tablet 3   carvedilol (COREG) 25 MG tablet Take 1 tablet (25 mg total) by mouth 2 (two) times daily with a meal. 180 tablet 3   metFORMIN (GLUCOPHAGE) 500 MG tablet Take 1 tablet (500 mg total) by mouth daily with breakfast. (Patient taking differently: Take 500 mg by mouth daily with breakfast. Patient taking 1/2 daily) 90 tablet 1   Olmesartan-amLODIPine-HCTZ (TRIBENZOR) 40-10-25 MG TABS Take 1 tablet by mouth daily. 90 tablet 1   rosuvastatin (CRESTOR) 40 MG tablet Take 1 tablet (40 mg total) by mouth at bedtime. 90 tablet 0   VITAMIN D, CHOLECALCIFEROL, PO Take 25 mcg by mouth daily. 1000 IU     polyethylene glycol powder  (GLYCOLAX/MIRALAX) 17 GM/SCOOP powder Take 17 g by mouth 2 (two) times daily as needed. 116 g 0   [DISCONTINUED] potassium chloride (KLOR-CON) 10 MEQ tablet Take 1 tablet (10 mEq total) by mouth daily for 30 doses. 30 tablet 0   No current facility-administered medications on file prior to visit.      The following portions of the patient's history were reviewed and updated as appropriate: allergies, current medications, past family history, past medical history, past social history, past surgical history and problem list.  ROS Otherwise as in subjective above  Objective: BP 140/80   Pulse 65   Temp (!) 97.1 F (36.2 C)   Wt 185 lb 6.4 oz (84.1 kg)   BMI 31.82 kg/m   BP Readings from Last 3 Encounters:  03/29/21 140/80  02/02/21 134/83  12/18/20 136/84    Wt Readings from Last 3 Encounters:  03/29/21 185 lb 6.4 oz (84.1 kg)  02/02/21 184 lb (83.5 kg)  12/18/20 185 lb 9.6 oz (84.2 kg)   General appearance: alert, no distress, well developed, well nourished Neck: supple, no lymphadenopathy, no thyromegaly, no masses Heart: RRR, normal S1, S2, no murmurs Lungs: CTA bilaterally, no wheezes, rhonchi, or rales Abdomen: +bs, soft, non tender, non distended, no masses, no  hepatomegaly, no splenomegaly Pulses: 2+ radial pulses, 2+ pedal pulses, normal cap refill Ext: no edema    Assessment: Encounter Diagnoses  Name Primary?   Hypertension, unspecified type Yes   Needs flu shot    Mixed hyperlipidemia    Type 2 diabetes mellitus with hyperglycemia, without long-term current use of insulin (HCC)    Other fatigue      Plan: Discussed her concerns and risks of not taking her medications.  Discussed risks of uncontrolled BP and diabetes.   Advised she get a BP cuff.   Discussed the recommendations below  Patient Instructions  Given your ongoing concerns about dizziness and not feeling well, let us try the following:  Get a blood pressure cuff and start checking your  blood pressures twice daily and check it anytime you do not feel good The goal is 120/70 To low blood pressure would be something like 100/60 or lower Too high would be anything over 140/90  Start using your blood sugar tester, glucometer Check before breakfast and before meals or either 2 hours after needed Try to check at least twice daily Morning fasting goal is less than 130 sugar Anything over 200 is too high Anything less than 70 is too low  Try using Carvedilol/Coreg 1/2 tablet twice daily for the next week  Lets see if that makes any difference on how you feel   Miciah was seen today for med check.  Diagnoses and all orders for this visit:  Hypertension, unspecified type -     Comprehensive metabolic panel  Needs flu shot -     Flu Vaccine QUAD 24mo+IM (Fluarix, Fluzone & Alfiuria Quad PF)  Mixed hyperlipidemia -     Comprehensive metabolic panel  Type 2 diabetes mellitus with hyperglycemia, without long-term current use of insulin (HCC) -     Hemoglobin A1c -     Comprehensive metabolic panel  Other fatigue   Follow up: pending labs

## 2021-03-30 LAB — COMPREHENSIVE METABOLIC PANEL
ALT: 17 IU/L (ref 0–32)
AST: 17 IU/L (ref 0–40)
Albumin/Globulin Ratio: 1.8 (ref 1.2–2.2)
Albumin: 4.6 g/dL (ref 3.8–4.8)
Alkaline Phosphatase: 82 IU/L (ref 44–121)
BUN/Creatinine Ratio: 17 (ref 12–28)
BUN: 23 mg/dL (ref 8–27)
Bilirubin Total: 0.2 mg/dL (ref 0.0–1.2)
CO2: 27 mmol/L (ref 20–29)
Calcium: 10.2 mg/dL (ref 8.7–10.3)
Chloride: 100 mmol/L (ref 96–106)
Creatinine, Ser: 1.37 mg/dL — ABNORMAL HIGH (ref 0.57–1.00)
Globulin, Total: 2.6 g/dL (ref 1.5–4.5)
Glucose: 152 mg/dL — ABNORMAL HIGH (ref 70–99)
Potassium: 3.6 mmol/L (ref 3.5–5.2)
Sodium: 142 mmol/L (ref 134–144)
Total Protein: 7.2 g/dL (ref 6.0–8.5)
eGFR: 43 mL/min/{1.73_m2} — ABNORMAL LOW (ref >59)

## 2021-03-30 LAB — HEMOGLOBIN A1C
Est. average glucose Bld gHb Est-mCnc: 174 mg/dL
Hgb A1c MFr Bld: 7.7 % — ABNORMAL HIGH (ref 4.8–5.6)

## 2021-04-06 ENCOUNTER — Telehealth: Payer: Self-pay | Admitting: Family Medicine

## 2021-04-06 MED ORDER — BLOOD GLUCOSE METER KIT: PACK | 0 refills | Status: AC

## 2021-04-06 NOTE — Telephone Encounter (Signed)
Pt want meter to check her blood sugar called into [facility]

## 2021-04-06 NOTE — Telephone Encounter (Signed)
Sent in blood glucose and pharmacy should decide what meter goes with insurance

## 2021-05-03 ENCOUNTER — Ambulatory Visit (INDEPENDENT_AMBULATORY_CARE_PROVIDER_SITE_OTHER): Payer: BC Managed Care – PPO | Admitting: Medical

## 2021-05-03 ENCOUNTER — Other Ambulatory Visit: Payer: Self-pay

## 2021-05-03 ENCOUNTER — Encounter: Payer: Self-pay | Admitting: Medical

## 2021-05-03 VITALS — BP 110/64 | HR 73 | Wt 185.2 lb

## 2021-05-03 DIAGNOSIS — E559 Vitamin D deficiency, unspecified: Secondary | ICD-10-CM

## 2021-05-03 DIAGNOSIS — E1165 Type 2 diabetes mellitus with hyperglycemia: Secondary | ICD-10-CM

## 2021-05-03 DIAGNOSIS — I10 Essential (primary) hypertension: Secondary | ICD-10-CM | POA: Diagnosis not present

## 2021-05-03 DIAGNOSIS — E1169 Type 2 diabetes mellitus with other specified complication: Secondary | ICD-10-CM | POA: Diagnosis not present

## 2021-05-03 DIAGNOSIS — I7090 Unspecified atherosclerosis: Secondary | ICD-10-CM | POA: Diagnosis not present

## 2021-05-03 DIAGNOSIS — E785 Hyperlipidemia, unspecified: Secondary | ICD-10-CM

## 2021-05-03 NOTE — Progress Notes (Signed)
Subjective:  Penny Rice is a 65 y.o. female who presents for Chief Complaint  Patient presents with   diabetes follow-up    Diabetes. Wants to make sure she is taking her medications right   Here for med check  Diabetes -since last visit she has been better about checking glucose some.  Her glucometer shows glucose 135 , 30 day average, glucose 139, 14 day average.  No recent low readings.  She has had a few readings a little bit above 140 and some readings as low as 88.  No polyuria, no polydipsia, no blurred vision.  She is compliant with metformin 500 mg once daily.  Lately she has not had any concerns or symptoms  Hypertension-she saw cardiology recently.  Prior to her last visit here she had quit all of her medicines temporarily because she did not feel good.  Since last visit here she for what ever reason was taking carvedilol 2 tablets at 1 time in the morning instead of twice daily and was feeling bad.  Once she changed this to 1 tablet twice a day she is felt fine.  Currently she is compliant with carvedilol 25 mg twice daily, olmesartan amlodipine hydrochlorothiazide once daily.  Hyperlipidemia-compliant with rosuvastatin without complaint  Overall has felt much better in the last few weeks  She does want to make sure she is taking her medications properly.  She is trying to eat healthy.  She has cut out [facility] recently  Vit D deficiency -  takes vit D supplement periodically.    No other aggravating or relieving factors.    No other c/o.  Past Medical History:  Diagnosis Date   Allergy    Arthritis    Diabetes mellitus without complication ([place])    Hyperlipidemia    Hypertension    Vitamin D deficiency    Current Outpatient Medications on File Prior to Visit  Medication Sig Dispense Refill   aspirin EC 81 MG tablet Take 1 tablet (81 mg total) by mouth daily. 90 tablet 3   blood glucose meter kit and supplies Dispense based on patient and insurance preference.  Use up to twice a daily as directed. (FOR ICD-10 E10.9, E11.9). 1 each 0   carvedilol (COREG) 25 MG tablet Take 1 tablet (25 mg total) by mouth 2 (two) times daily with a meal. 180 tablet 3   metFORMIN (GLUCOPHAGE) 500 MG tablet Take 1 tablet (500 mg total) by mouth daily with breakfast. 90 tablet 1   Olmesartan-amLODIPine-HCTZ (TRIBENZOR) 40-10-25 MG TABS Take 1 tablet by mouth daily. 90 tablet 1   rosuvastatin (CRESTOR) 40 MG tablet Take 1 tablet (40 mg total) by mouth at bedtime. 90 tablet 0   [DISCONTINUED] potassium chloride (KLOR-CON) 10 MEQ tablet Take 1 tablet (10 mEq total) by mouth daily for 30 doses. 30 tablet 0   No current facility-administered medications on file prior to visit.     The following portions of the patient's history were reviewed and updated as appropriate: allergies, current medications, past family history, past medical history, past social history, past surgical history and problem list.  ROS Otherwise as in subjective above     Objective: BP 110/64   Pulse 73   Wt 185 lb 3.2 oz (84 kg)   BMI 31.79 kg/m   General appearance: alert, no distress, well developed, well nourished Psych: Pleasant, good eye contact, answers questions appropriate   Assessment: Encounter Diagnoses  Name Primary?   Dyslipidemia associated with type 2  diabetes mellitus ([place]) Yes   Hypertension, unspecified type    Type 2 diabetes mellitus with hyperglycemia, without long-term current use of insulin (HCC)    Atherosclerosis    Vitamin D deficiency      Plan: We discussed her concerns, clarified and reconciled medications.  We spent most of her time talking about medications, proper use, potential side effects, nutrition.  We will refer to nutrition counseling.  We will plan to do labs next visit.   Diabetes Continue checking sugars as you are doing, preferably fasting in the morning, several days per week Goal is 80-130 fasting glucose Avoid lows under 70 Don't skip  meals Continue metformin once daily We will refer you to nutritionist  High blood pressure Your BP was good today Continue Carvedilol/coreg 68m 1 tablet twice daily Continue Olmesartan Amlodipine HCT combo daily in the morning  High cholesterol and atherosclerosis Take aspirin 8354mdaily either with or 30 minutes prior to Crestor Continue Rosuvastatin/Crestor daily at bedtime.   Vitamin D deficiency  Restart vitamin D 1000 units daily.  She is using over-the-counter vitamin D   Penny Rice seen today for diabetes follow-up.  Diagnoses and all orders for this visit:  Dyslipidemia associated with type 2 diabetes mellitus ([place] Hypertension, unspecified type -     Basic metabolic panel; Future  Type 2 diabetes mellitus with hyperglycemia, without long-term current use of insulin (HCC) -     Amb ref to Medical Nutrition Therapy-MNT -     Hemoglobin A1c; Future  Atherosclerosis  Vitamin D deficiency -     VITAMIN D 25 Hydroxy (Vit-D Deficiency, Fractures); Future   Follow up: 54m34mo

## 2021-05-03 NOTE — Patient Instructions (Addendum)
Diabetes Continue checking sugars as you are doing, preferably fasting in the morning, several days per week Goal is 80-130 fasting glucose Avoid lows under 70 Don't skip meals Continue metformin once daily We will refer you to nutritionist Check insurance coverage for [facility] 2 or 3 glucose monitor  High blood pressure Your BP was good today Continue Carvedilol/coreg 25mg  1 tablet twice daily Continue Olmesartan Amlodipine HCT combo daily in the morning  High cholesterol Take Apisin 81 mg daily either with or 30 minutes prior to Crestor Continue Rosuvastatin/Crestor daily at bedtime.  Vitamin D deficiency  Restart vitamin D 1000 units daily   Shingles vaccine:  I recommend you have a shingles vaccine to help prevent shingles or herpes zoster outbreak.   Please call your insurer to inquire about coverage for the Shingrix vaccine given in 2 doses.   Some insurers cover this vaccine after age 22, some cover this after age 68.  If your insurer covers this, then call to schedule appointment to have this vaccine here.  I also recommend updated tetanus booster

## 2021-05-25 ENCOUNTER — Other Ambulatory Visit: Payer: Self-pay | Admitting: Cardiology

## 2021-05-25 DIAGNOSIS — E782 Mixed hyperlipidemia: Secondary | ICD-10-CM

## 2021-05-25 DIAGNOSIS — I2510 Atherosclerotic heart disease of native coronary artery without angina pectoris: Secondary | ICD-10-CM

## 2021-05-25 DIAGNOSIS — E1165 Type 2 diabetes mellitus with hyperglycemia: Secondary | ICD-10-CM

## 2021-06-12 ENCOUNTER — Other Ambulatory Visit: Payer: Self-pay | Admitting: Medical

## 2021-08-03 ENCOUNTER — Encounter: Payer: Self-pay | Admitting: Cardiology

## 2021-08-03 ENCOUNTER — Ambulatory Visit: Payer: BC Managed Care – PPO | Admitting: Cardiology

## 2021-08-03 ENCOUNTER — Other Ambulatory Visit: Payer: Self-pay

## 2021-08-03 VITALS — BP 138/82 | HR 71 | Temp 98.2°F | Resp 16 | Ht 64.0 in | Wt 189.6 lb

## 2021-08-03 DIAGNOSIS — E1165 Type 2 diabetes mellitus with hyperglycemia: Secondary | ICD-10-CM

## 2021-08-03 DIAGNOSIS — I2510 Atherosclerotic heart disease of native coronary artery without angina pectoris: Secondary | ICD-10-CM

## 2021-08-03 DIAGNOSIS — Z6832 Body mass index (BMI) 32.0-32.9, adult: Secondary | ICD-10-CM

## 2021-08-03 DIAGNOSIS — I2584 Coronary atherosclerosis due to calcified coronary lesion: Secondary | ICD-10-CM

## 2021-08-03 DIAGNOSIS — I10 Essential (primary) hypertension: Secondary | ICD-10-CM | POA: Diagnosis not present

## 2021-08-03 DIAGNOSIS — E7800 Pure hypercholesterolemia, unspecified: Secondary | ICD-10-CM | POA: Diagnosis not present

## 2021-08-03 DIAGNOSIS — I252 Old myocardial infarction: Secondary | ICD-10-CM | POA: Diagnosis not present

## 2021-08-03 DIAGNOSIS — E782 Mixed hyperlipidemia: Secondary | ICD-10-CM

## 2021-08-03 DIAGNOSIS — E6609 Other obesity due to excess calories: Secondary | ICD-10-CM

## 2021-08-03 NOTE — Progress Notes (Signed)
ID:  Penny Rice, DOB 09-Mar-1956, MRN [id number]  PCP:  Carlena Hurl, PA-C  Cardiologist:  Rex Kras, DO, [facility] (established care 07/16/2020)  Date: 08/03/21 Last Office Visit: 02/02/2021   Chief Complaint  Patient presents with   Coronary artery calcification   Follow-up   HPI  Penny Rice is a 66 y.o. female who presents to the office with a chief complaint of " 77-monthfollow-up for CAD and blood pressure management." Patient's past medical history and cardiovascular risk factors include: Severe coronary artery calcification, atherosclerosis of the coronary arteries per coronary CTA, HTN, HLD, NIDDM, postmenopausal female, obesity due to excess calories.  She is referred to the office at the request of TCarlena Hurl PA-C for evaluation of chest pain and hypertension.  Since establishing care patient underwent ischemic work-up including a coronary CTA which noted severe coronary artery calcification and nonobstructive CAD.  Her CT FFR was negative for hemodynamically significant stenosis.  Since then she has been started on aspirin and cholesterol medication and based on her fasting lipid profile in August 2022 her LDL has improved from 117 to 73 mg/dL.  She presents today for 652-monthollow-up visit.  Clinically she denies any chest pain to suggest angina pectoris or heart failure symptoms.  No hospitalizations or urgent care visits for cardiovascular symptoms.  During her working days patient states that when she takes her morning medications soon thereafter and experiences feeling tired and fatigued.  However this is not replicated on days that she has off from work.  She does not check her blood pressures regularly but states that they are better controlled compared to when she establish care.  No recent labs available for review.  No family history of premature coronary disease or sudden cardiac death.  FUNCTIONAL STATUS: Works at [employer] does heavy lifting. No  structured exercise program or daily routine.   ALLERGIES: No Known Allergies  MEDICATION LIST PRIOR TO VISIT: Current Meds  Medication Sig   aspirin EC 81 MG tablet Take 1 tablet (81 mg total) by mouth daily.   blood glucose meter kit and supplies Dispense based on patient and insurance preference. Use up to twice a daily as directed. (FOR ICD-10 E10.9, E11.9).   carvedilol (COREG) 25 MG tablet Take 1 tablet (25 mg total) by mouth 2 (two) times daily with a meal.   metFORMIN (GLUCOPHAGE) 500 MG tablet TAKE 1 TABLET(500 MG) BY MOUTH DAILY WITH BREAKFAST   Olmesartan-amLODIPine-HCTZ (TRIBENZOR) 40-10-25 MG TABS Take 1 tablet by mouth daily.   rosuvastatin (CRESTOR) 40 MG tablet TAKE 1 TABLET(40 MG) BY MOUTH AT BEDTIME     PAST MEDICAL HISTORY: Past Medical History:  Diagnosis Date   Allergy    Arthritis    Diabetes mellitus without complication ([place]   Hyperlipidemia    Hypertension    Vitamin D deficiency     PAST SURGICAL HISTORY: Past Surgical History:  Procedure Laterality Date   ABDOMINAL HYSTERECTOMY     pt reports full   COLONOSCOPY  2016   Dr. MaLucio Edward COLONOSCOPY  02/18/2020   POLYPECTOMY     TOTAL ABDOMINAL HYSTERECTOMY     per pt    FAMILY HISTORY: The patient family history includes Cancer in her son; Diabetes in her sister; Hypertension in her mother, sister, sister, sister, sister, and sister; Other in her father.  SOCIAL HISTORY:  The patient  reports that she has never smoked. She has never used smokeless tobacco. She  reports that she does not drink alcohol and does not use drugs.  REVIEW OF SYSTEMS: Review of Systems  Constitutional: Positive for malaise/fatigue.  Cardiovascular:  Negative for chest pain, dyspnea on exertion, leg swelling, near-syncope, orthopnea, palpitations, paroxysmal nocturnal dyspnea and syncope.  Respiratory:  Negative for shortness of breath.    PHYSICAL EXAM: Vitals with BMI 08/03/2021 08/03/2021 05/03/2021  Height  - _0  -  Weight - 189 lbs 10 oz 185 lbs 3 oz  BMI - 12.75 -  Systolic 170 017 494  Diastolic 82 91 64  Pulse 71 72 73    CONSTITUTIONAL: Well-developed and well-nourished. No acute distress.  SKIN: Skin is warm and dry. No rash noted. No cyanosis. No pallor. No jaundice HEAD: Normocephalic and atraumatic.  EYES: No scleral icterus MOUTH/THROAT: Moist oral membranes.  NECK: No JVD present. No thyromegaly noted. No carotid bruits  LYMPHATIC: No visible cervical adenopathy.  CHEST Normal respiratory effort. No intercostal retractions  LUNGS: Clear to auscultation bilaterally.  No stridor. No wheezes. No rales.  CARDIOVASCULAR: Regular rate and rhythm, positive S1-S2, no murmurs rubs or gallops appreciated. ABDOMINAL: No apparent ascites.  EXTREMITIES: No peripheral edema  HEMATOLOGIC: No significant bruising NEUROLOGIC: Oriented to person, place, and time. Nonfocal. Normal muscle tone.  PSYCHIATRIC: Normal mood and affect. Normal behavior. Cooperative No significant change in physical examination.  No significant change in physical examination from last office encounter.  CARDIAC DATABASE: EKG: 07/16/2020: Normal sinus rhythm, 64 bpm, PRWP, nonspecific T wave abnormality.  08/03/2021: NSR, 63 bpm, normal axis, diffuse nonspecific T wave abnormality.   Echocardiogram: 07/30/2020:  Left ventricle cavity is normal in size. Mild concentric hypertrophy of the left ventricle. Normal global wall motion. Normal LV systolic function with EF 55%. Doppler evidence of grade I diastolic dysfunction, normal LAP.  Left atrial cavity is mildly dilated. Mild (Grade I) mitral regurgitation. Mild tricuspid regurgitation.  No evidence of pulmonary hypertension.  Stress Testing: No results found for this or any previous visit from the past 1095 days.  Heart Catheterization: None  Renal artery duplex  07/30/2020:  No evidence of renal artery occlusive disease in either renal artery. Normal  intrarenal vascular perfusion is noted in both kidneys. Renal length is within normal limits for both kidneys. Normal abdominal aorta flow velocities noted. No significant atherosclerotic plaque noted in the abdominal aorta.   Coronary CTA 08/11/2020:  1. Total coronary calcium score of 1404. This was 99th percentile for age and sex matched control. 2. Normal coronary origin with right dominance. 3. CAD-RADS 4. Approximately 70 percent stenosis in the proximal LAD due to mixed plaque. Ramus intermedius overall patent with diffuse calcified and noncalcified plaque. Moderate stenosis (50-69%) at the ostial CX due to calcified plaque. Diffuse calcified and noncalcified plaque within the proximal to mid RCA. Mild stenosis (25-49%) in distal RCA due to mixed plaque. 4. Aortic atherosclerosis within the ascending and the descending thoracic aorta. CT FFR analysis showed no significant stenosis in the proximal LAD or LCx distribution.  LABORATORY DATA: CBC Latest Ref Rng & Units 10/11/2020 09/11/2020 02/05/2020  WBC 4.0 - 10.5 K/uL 7.7 8.9 8.1  Hemoglobin 12.0 - 15.0 g/dL 13.9 12.9 12.3  Hematocrit 36.0 - 46.0 % 42.0 39.2 36.5  Platelets 150 - 400 K/uL 244 253 262    CMP Latest Ref Rng & Units 03/29/2021 01/28/2021 12/09/2020  Glucose 70 - 99 mg/dL 152(H) 145(H) 136(H)  BUN 8 - 27 mg/dL _1 Creatinine 0.57 - 1.00 mg/dL 1.37(H)  0.94 0.86  Sodium 134 - 144 mmol/L 142 145(H) 144  Potassium 3.5 - 5.2 mmol/L 3.6 4.0 3.6  Chloride 96 - 106 mmol/L 100 106 105  CO2 20 - 29 mmol/L 27 25 -  Calcium 8.7 - 10.3 mg/dL 10.2 10.5(H) 10.5(H)  Total Protein 6.0 - 8.5 g/dL 7.2 7.0 6.7  Total Bilirubin 0.0 - 1.2 mg/dL 0.2 0.3 0.4  Alkaline Phos 44 - 121 IU/L 82 75 85  AST 0 - 40 IU/L _0 ALT 0 - 32 IU/L 17 24 -    Lipid Panel     Component Value Date/Time   CHOL 153 01/28/2021 0938   TRIG 125 01/28/2021 0938   HDL 52 01/28/2021 0938   CHOLHDL 4.1 02/05/2020 1104   LDLCALC 79 01/28/2021 0938    LDLDIRECT 73 01/28/2021 0939   LABVLDL 22 01/28/2021 0938    No components found for: NTPROBNP No results for input(s): PROBNP in the last 8760 hours. Recent Labs    09/11/20 1857  TSH 0.499    BMP Recent Labs    09/11/20 1857 10/11/20 1022 12/09/20 1125 01/28/21 0938 03/29/21 1631  NA 139 140 144 145* 142  K 2.9* 3.5 3.6 4.0 3.6  CL 103 103 105 106 100  CO2 29 29  --  25 27  GLUCOSE 147* 120* 136* 145* 152*  BUN _1 CREATININE 1.15* 0.86 0.86 0.94 1.37*  CALCIUM 9.5 10.1 10.5* 10.5* 10.2  GFRNONAA 54* >60  --   --   --     HEMOGLOBIN A1C Lab Results  Component Value Date   HGBA1C 7.7 (H) 03/29/2021    IMPRESSION:    ICD-10-CM   1. Atherosclerosis of native coronary artery of native heart without angina pectoris  I25.10 EKG 12-Lead    Lipid Panel With LDL/HDL Ratio    LDL cholesterol, direct    CMP14+EGFR    2. Coronary atherosclerosis due to calcified coronary lesion of native artery  I25.10 Lipid Panel With LDL/HDL Ratio   I25.84 LDL cholesterol, direct    CMP14+EGFR    3. Benign hypertension  I10     4. Mixed hyperlipidemia  E78.2 Lipid Panel With LDL/HDL Ratio    LDL cholesterol, direct    CMP14+EGFR    5. Type 2 diabetes mellitus with hyperglycemia, without long-term current use of insulin (HCC)  E11.65     6. Class 1 obesity due to excess calories with serious comorbidity and body mass index (BMI) of 32.0 to 32.9 in adult  E66.09    Z68.32        RECOMMENDATIONS: PAITEN BOIES is a 66 y.o. female whose past medical history and cardiac risk factors include: Severe coronary artery calcification, atherosclerosis of the coronary arteries per coronary CTA, HTN, HLD, NIDDM, postmenopausal female, obesity due to excess calories.  Atherosclerosis of native coronary artery of native heart without angina pectoris /severe CAC: Total coronary artery calcium score 1404. Noted to have CAD RADS 4; CT FFR negative for hemodynamically significant  stenosis. Currently on medical therapy No reoccurrence of angina pectoris No hospitalizations or urgent care visits for cardiovascular symptoms since last office encounter. Lipids have improved since initiation of statin therapy. We will recheck fasting lipid profile and CMP. No additional cardiac work-up recommended at this time as she is asymptomatic. We emphasized the importance of secondary prevention.  Benign hypertension Office blood pressures are well controlled. I do not have a home blood pressure log for  review. Given her symptoms of feeling tired and fatigue at times after taking her morning medications I have asked her to keep a log and to call the office to see if medications need to be adjusted. We will check labs as discussed above. Reemphasized importance of a low-salt diet. Educated importance of increasing physical activity to at least 30 minutes of moderate intensity exercise 5 days a week as tolerated.  Mixed hyperlipidemia Initial LDL levels 170 mg/dL, most recent direct LDL 73 mg/dL. Check fasting lipid profile. Further recommendations to follow.  Type 2 diabetes mellitus with hyperglycemia, without long-term current use of insulin (HCC) Emphasized the importance of glycemic control. Follows up with PCP. Currently on ARB, statin therapy, carvedilol, metformin.  This office visit discussed management at least 2 chronic comorbid conditions, independently reviewed labs dated 01/28/2021, reviewed the results of the echo and coronary CTA, discussed disease management, ordered additional diagnostic work-up as discussed above.  FINAL MEDICATION LIST END OF ENCOUNTER: No orders of the defined types were placed in this encounter.     Current Outpatient Medications:    aspirin EC 81 MG tablet, Take 1 tablet (81 mg total) by mouth daily., Disp: 90 tablet, Rfl: 3   blood glucose meter kit and supplies, Dispense based on patient and insurance preference. Use up to twice a  daily as directed. (FOR ICD-10 E10.9, E11.9)., Disp: 1 each, Rfl: 0   carvedilol (COREG) 25 MG tablet, Take 1 tablet (25 mg total) by mouth 2 (two) times daily with a meal., Disp: 180 tablet, Rfl: 3   metFORMIN (GLUCOPHAGE) 500 MG tablet, TAKE 1 TABLET(500 MG) BY MOUTH DAILY WITH BREAKFAST, Disp: 90 tablet, Rfl: 1   Olmesartan-amLODIPine-HCTZ (TRIBENZOR) 40-10-25 MG TABS, Take 1 tablet by mouth daily., Disp: 90 tablet, Rfl: 1   rosuvastatin (CRESTOR) 40 MG tablet, TAKE 1 TABLET(40 MG) BY MOUTH AT BEDTIME, Disp: 90 tablet, Rfl: 0  Orders Placed This Encounter  Procedures   Lipid Panel With LDL/HDL Ratio   LDL cholesterol, direct   CMP14+EGFR   EKG 12-Lead    There are no Patient Instructions on file for this visit.   --Continue cardiac medications as reconciled in final medication list. --Return in about 6 months (around 01/31/2022) for Follow up, CAD, Coronary artery calcification. Or sooner if needed. --Continue follow-up with your primary care physician regarding the management of your other chronic comorbid conditions.  Patient's questions and concerns were addressed to her satisfaction. She voices understanding of the instructions provided during this encounter.   This note was created using a voice recognition software as a result there may be grammatical errors inadvertently enclosed that do not reflect the nature of this encounter. Every attempt is made to correct such errors.  Rex Kras, [place], [facility]  Pager: [phone] Office: [phone]

## 2021-08-21 ENCOUNTER — Other Ambulatory Visit: Payer: Self-pay | Admitting: Medical

## 2021-08-24 DIAGNOSIS — H35361 Drusen (degenerative) of macula, right eye: Secondary | ICD-10-CM | POA: Diagnosis not present

## 2021-08-24 DIAGNOSIS — I10 Essential (primary) hypertension: Secondary | ICD-10-CM | POA: Diagnosis not present

## 2021-08-24 DIAGNOSIS — E119 Type 2 diabetes mellitus without complications: Secondary | ICD-10-CM | POA: Diagnosis not present

## 2021-08-24 DIAGNOSIS — H2513 Age-related nuclear cataract, bilateral: Secondary | ICD-10-CM | POA: Diagnosis not present

## 2021-08-27 DIAGNOSIS — R030 Elevated blood-pressure reading, without diagnosis of hypertension: Secondary | ICD-10-CM | POA: Diagnosis not present

## 2021-08-27 DIAGNOSIS — K068 Other specified disorders of gingiva and edentulous alveolar ridge: Secondary | ICD-10-CM | POA: Diagnosis not present

## 2021-08-30 ENCOUNTER — Ambulatory Visit (INDEPENDENT_AMBULATORY_CARE_PROVIDER_SITE_OTHER): Payer: BC Managed Care – PPO | Admitting: Medical

## 2021-08-30 ENCOUNTER — Other Ambulatory Visit: Payer: Self-pay

## 2021-08-30 VITALS — BP 110/70 | HR 68 | Temp 97.8°F | Wt 183.2 lb

## 2021-08-30 DIAGNOSIS — R519 Headache, unspecified: Secondary | ICD-10-CM

## 2021-08-30 DIAGNOSIS — E1165 Type 2 diabetes mellitus with hyperglycemia: Secondary | ICD-10-CM

## 2021-08-30 DIAGNOSIS — I10 Essential (primary) hypertension: Secondary | ICD-10-CM | POA: Diagnosis not present

## 2021-08-30 DIAGNOSIS — E1169 Type 2 diabetes mellitus with other specified complication: Secondary | ICD-10-CM

## 2021-08-30 DIAGNOSIS — E785 Hyperlipidemia, unspecified: Secondary | ICD-10-CM

## 2021-08-30 MED ORDER — HYDROCODONE-ACETAMINOPHEN 5-325 MG PO TABS: 1.0000 | ORAL_TABLET | Freq: Four times a day (QID) | ORAL | 0 refills | Status: DC | PRN

## 2021-08-30 NOTE — Progress Notes (Signed)
Subjective: ? Penny Rice is a 66 y.o. female who presents for ?Chief Complaint  ?Patient presents with  ? left gum tingling  ?  Left gum tingling- having pain since march 18th. Has an appt with Dentist today at 12:30  ?   ?Here for left gum discomfort.  She had a similar issue several months ago saw dentist, had a tooth pulled.  However the pain has continued on and off.  She went to urgent care the weekend and they told her to follow-up here.  They did not see anything wrong.  She is here today for pain, request something to help with the pain as over-the-counter Tylenol is not helping.  She has appointment with the dentist today as well fortunately.  She feels some sinus pressure but no sore throat, no ear pain, no fever. ? ?She notes feeling sluggish or fatigue in general.  She talk to cardiology about this and thought it was coming from all of her medications.  They advise she follow-up with me PCP. ? ?She would like labs today as well ? ?Diabetes-she is compliant with metformin 500 mg daily for diabetes.  Not checking sugars of late ? ?Hypertension-compliant with carvedilol twice daily, olmesartan amlodipine HCT daily ? ?Hyperlipidemia-compliant with Crestor daily without complaint other than fatigue.  She takes this at night ? ?She checks her blood pressure and diabetes medicines in the morning, carvedilol twice daily. ? ?She works first shift schedule ? ?No other aggravating or relieving factors.   ? ?No other c/o. ? ?Past Medical History:  ?Diagnosis Date  ? Allergy   ? Arthritis   ? Diabetes mellitus without complication ([place])   ? Hyperlipidemia   ? Hypertension   ? Vitamin D deficiency   ? ?Current Outpatient Medications on File Prior to Visit  ?Medication Sig Dispense Refill  ? aspirin EC 81 MG tablet Take 1 tablet (81 mg total) by mouth daily. 90 tablet 3  ? carvedilol (COREG) 25 MG tablet Take 1 tablet (25 mg total) by mouth 2 (two) times daily with a meal. 180 tablet 3  ? metFORMIN (GLUCOPHAGE) 500 MG  tablet TAKE 1 TABLET(500 MG) BY MOUTH DAILY WITH BREAKFAST 90 tablet 1  ? Olmesartan-amLODIPine-HCTZ 40-10-25 MG TABS TAKE 1 TABLET BY MOUTH DAILY 90 tablet 1  ? rosuvastatin (CRESTOR) 40 MG tablet TAKE 1 TABLET(40 MG) BY MOUTH AT BEDTIME 90 tablet 0  ? blood glucose meter kit and supplies Dispense based on patient and insurance preference. Use up to twice a daily as directed. (FOR ICD-10 E10.9, E11.9). 1 each 0  ? [DISCONTINUED] potassium chloride (KLOR-CON) 10 MEQ tablet Take 1 tablet (10 mEq total) by mouth daily for 30 doses. 30 tablet 0  ? ?No current facility-administered medications on file prior to visit.  ? ? ? ?The following portions of the patient's history were reviewed and updated as appropriate: allergies, current medications, past family history, past medical history, past social history, past surgical history and problem list. ? ?ROS ?Otherwise as in subjective above ? ?Objective: ?BP 110/70   Pulse 68   Temp 97.8 ?F (36.6 ?C)   Wt 183 lb 3.2 oz (83.1 kg)   BMI 31.45 kg/m?  ? ?General appearance: alert, no distress, well developed, well nourished ?HEENT: normocephalic, sclerae anicteric, conjunctiva pink and moist, TMs pearly, nares with erythema and turbinated swelling bilat, otherwise pharynx normal ?Oral cavity: MMM, no obvious lesions, she is missing most of her upper teeth but she does have 2 upper left  incisors without obvious decay, there is a lot of plaque on her lower teeth ?Neck: supple, no lymphadenopathy, no thyromegaly, no masses ?Heart: RRR, normal S1, S2, no murmurs ?Lungs: CTA bilaterally, no wheezes, rhonchi, or rales ?Pulses: 2+ radial pulses, 2+ pedal pulses, normal cap refill ?Ext: no edema ? ? ?Assessment: ?Encounter Diagnoses  ?Name Primary?  ? Facial pain Yes  ? Dyslipidemia associated with type 2 diabetes mellitus ([place])   ? Type 2 diabetes mellitus with hyperglycemia, without long-term current use of insulin ([place])   ? Hypertension, unspecified type   ? ? ? ?Plan: ?She was  originally scheduled for pain only if she wanted to update her labs to check on her sugars while she was here.  She feels like some of her medicines are causing her to feel fatigued a lot. ? ?Facial pain-possibly dental abscess versus sinus infection.  Short-term pain medicine prescribed below.  Discussed risk and benefits and proper use of medication.  She will see dentist today.  Pending dentist appointment if needed we can put her on antibiotic for sinus infection if they do not feel she has a dental infection ? ?Diabetes-labs today, we will likely continue metformin daily ? ?Hypertension-likely fatigue from carvedilol or combination medications.  Updated labs today.  Consider some modified dosing regimen of carvedilol or other regimen to try to reduce her feeling of fatigue ? ?Hyperlipidemia - continue statin ? ?[name] was seen today for left gum tingling. ? ?Diagnoses and all orders for this visit: ? ?Facial pain ?-     CBC ? ?Dyslipidemia associated with type 2 diabetes mellitus ([place]) ? ?Type 2 diabetes mellitus with hyperglycemia, without long-term current use of insulin ([place]) ?-     Hemoglobin A1c ?-     CBC ?-     Basic metabolic panel ? ?Hypertension, unspecified type ?-     CBC ?-     Basic metabolic panel ? ?Other orders ?-     HYDROcodone-acetaminophen (NORCO) 5-325 MG tablet; Take 1 tablet by mouth every 6 (six) hours as needed. ? ? ? ?Follow up: pending labs ?

## 2021-08-31 ENCOUNTER — Other Ambulatory Visit: Payer: Self-pay | Admitting: Medical

## 2021-08-31 LAB — CBC
Hematocrit: 41.9 % (ref 34.0–46.6)
Hemoglobin: 13.9 g/dL (ref 11.1–15.9)
MCH: 29.3 pg (ref 26.6–33.0)
MCHC: 33.2 g/dL (ref 31.5–35.7)
MCV: 88 fL (ref 79–97)
Platelets: 255 10*3/uL (ref 150–450)
RBC: 4.75 x10E6/uL (ref 3.77–5.28)
RDW: 12.9 % (ref 11.7–15.4)
WBC: 7.4 10*3/uL (ref 3.4–10.8)

## 2021-08-31 LAB — BASIC METABOLIC PANEL
BUN/Creatinine Ratio: 21 (ref 12–28)
BUN: 19 mg/dL (ref 8–27)
CO2: 26 mmol/L (ref 20–29)
Calcium: 10.5 mg/dL — ABNORMAL HIGH (ref 8.7–10.3)
Chloride: 103 mmol/L (ref 96–106)
Creatinine, Ser: 0.91 mg/dL (ref 0.57–1.00)
Glucose: 182 mg/dL — ABNORMAL HIGH (ref 70–99)
Potassium: 3.5 mmol/L (ref 3.5–5.2)
Sodium: 141 mmol/L (ref 134–144)
eGFR: 70 mL/min/{1.73_m2} (ref >59)

## 2021-08-31 LAB — HEMOGLOBIN A1C
Est. average glucose Bld gHb Est-mCnc: 163 mg/dL
Hgb A1c MFr Bld: 7.3 % — ABNORMAL HIGH (ref 4.8–5.6)

## 2021-09-20 ENCOUNTER — Encounter: Payer: Self-pay | Admitting: *Deleted

## 2021-09-21 ENCOUNTER — Encounter: Payer: Self-pay | Admitting: Diagnostic Neuroimaging

## 2021-09-21 ENCOUNTER — Ambulatory Visit (INDEPENDENT_AMBULATORY_CARE_PROVIDER_SITE_OTHER): Payer: BC Managed Care – PPO | Admitting: Diagnostic Neuroimaging

## 2021-09-21 VITALS — BP 198/101 | HR 75 | Ht 64.0 in | Wt 183.2 lb

## 2021-09-21 DIAGNOSIS — G500 Trigeminal neuralgia: Secondary | ICD-10-CM

## 2021-09-21 MED ORDER — CARBAMAZEPINE ER 100 MG PO CP12: 100.0000 mg | ORAL_CAPSULE | Freq: Two times a day (BID) | ORAL | 6 refills | Status: DC

## 2021-09-21 NOTE — Progress Notes (Signed)
? ?[facility] ? ?PATIENT: Penny Rice ?DOB: 08/11/1955 ? ?REFERRING CLINICIAN: Margarette Canada, DDS ?HISTORY FROM: patient  ?REASON FOR VISIT: new consult  ? ? ?HISTORICAL ? ?CHIEF COMPLAINT:  ?Chief Complaint  ?Patient presents with  ? Atypical facial pain  ?  Rm 7 New Pt  " intermittent shooting/sharp pain in top gum on left and in left side of face sometimes; had 2 teeth pulled but pain is still there"   ? ? ?HISTORY OF PRESENT ILLNESS:  ? ?66 year old female here for evaluation of left mandibular pain.  Patient has had intermittent shocklike stabbing electrical shooting pain sensation lasting 3 to 5 seconds at a time, occurring every few months.  Symptoms started about 2 years ago.  She went to dentist and oral surgeon.  She had some extractions in the left upper molar region.  Unfortunately this did not help.  She tried some different pain medicines with mild relief. ? ? ?REVIEW OF SYSTEMS: Full 14 system review of systems performed and negative with exception of: as per HPI. ? ?ALLERGIES: ?No Known Allergies ? ?HOME MEDICATIONS: ?Outpatient Medications Prior to Visit  ?Medication Sig Dispense Refill  ? aspirin EC 81 MG tablet Take 1 tablet (81 mg total) by mouth daily. 90 tablet 3  ? blood glucose meter kit and supplies Dispense based on patient and insurance preference. Use up to twice a daily as directed. (FOR ICD-10 E10.9, E11.9). 1 each 0  ? carvedilol (COREG) 25 MG tablet Take 1 tablet (25 mg total) by mouth 2 (two) times daily with a meal. 180 tablet 3  ? HYDROcodone-acetaminophen (NORCO) 5-325 MG tablet Take 1 tablet by mouth every 6 (six) hours as needed. 12 tablet 0  ? metFORMIN (GLUCOPHAGE) 500 MG tablet TAKE 1 TABLET(500 MG) BY MOUTH DAILY WITH BREAKFAST 90 tablet 1  ? Olmesartan-amLODIPine-HCTZ 40-10-25 MG TABS TAKE 1 TABLET BY MOUTH DAILY 90 tablet 1  ? rosuvastatin (CRESTOR) 40 MG tablet TAKE 1 TABLET(40 MG) BY MOUTH AT BEDTIME 90 tablet 0  ? ?No facility-administered  medications prior to visit.  ? ? ?PAST MEDICAL HISTORY: ?Past Medical History:  ?Diagnosis Date  ? Allergy   ? Arthritis   ? Diabetes mellitus without complication ([place])   ? Facial pain   ? Hyperlipidemia   ? Hypertension   ? Vitamin D deficiency   ? ? ?PAST SURGICAL HISTORY: ?Past Surgical History:  ?Procedure Laterality Date  ? ABDOMINAL HYSTERECTOMY    ? pt reports full  ? COLONOSCOPY  2016  ? Dr. Lucio Edward  ? COLONOSCOPY  02/18/2020  ? POLYPECTOMY    ? TOOTH EXTRACTION  2022  ? 2 teeth pulled  ? TOTAL ABDOMINAL HYSTERECTOMY    ? per pt  ? ? ?FAMILY HISTORY: ?Family History  ?Problem Relation Age of Onset  ? Hypertension Sister   ? Diabetes Sister   ? Hypertension Mother   ? Other Father   ?     "violent death"  ? Cancer Son   ?     lung  ? Hypertension Sister   ? Hypertension Sister   ? Hypertension Sister   ? Hypertension Sister   ? Colon cancer Neg Hx   ? Heart disease Neg Hx   ? Stroke Neg Hx   ? Colon polyps Neg Hx   ? Rectal cancer Neg Hx   ? Stomach cancer Neg Hx   ? ? ?SOCIAL HISTORY: ?Social History  ? ?Socioeconomic History  ? Marital status: Married  ?  Spouse name: Not on file  ? Number of children: 2  ? Years of education: Not on file  ? Highest education level: Not on file  ?Occupational History  ? Not on file  ?Tobacco Use  ? Smoking status: Never  ? Smokeless tobacco: Never  ?Vaping Use  ? Vaping Use: Never used  ?Substance and Sexual Activity  ? Alcohol use: No  ?  Alcohol/week: 0.0 standard drinks  ? Drug use: No  ? Sexual activity: Not on file  ?Other Topics Concern  ? Not on file  ?Social History Narrative  ? Lives with husband, has 2 sons, no grandchildren.   Works at [employer].   01/2020  ? ?Social Determinants of Health  ? ?Financial Resource Strain: Not on file  ?Food Insecurity: Not on file  ?Transportation Needs: Not on file  ?Physical Activity: Not on file  ?Stress: Not on file  ?Social Connections: Not on file  ?Intimate Partner Violence: Not on file  ? ? ? ?PHYSICAL EXAM ? ?GENERAL  EXAM/CONSTITUTIONAL: ?Vitals:  ?Vitals:  ? 09/21/21 1544  ?BP: (!) 198/101  ?Pulse: 75  ?Weight: 183 lb 3.2 oz (83.1 kg)  ?Height: '5\' 4"'  (1.626 m)  ? ?Body mass index is 31.45 kg/m?. ?Wt Readings from Last 3 Encounters:  ?09/21/21 183 lb 3.2 oz (83.1 kg)  ?08/30/21 183 lb 3.2 oz (83.1 kg)  ?08/03/21 189 lb 9.6 oz (86 kg)  ? ?Patient is in no distress; well developed, nourished and groomed; neck is supple ? ?CARDIOVASCULAR: ?Examination of carotid arteries is normal; no carotid bruits ?Regular rate and rhythm, no murmurs ?Examination of peripheral vascular system by observation and palpation is normal ? ?EYES: ?Ophthalmoscopic exam of optic discs and posterior segments is normal; no papilledema or hemorrhages ?No results found. ? ?MUSCULOSKELETAL: ?Gait, strength, tone, movements noted in Neurologic exam below ? ?NEUROLOGIC: ?MENTAL STATUS:  ?   ? View : No data to display.  ?  ?  ?  ? ?awake, alert, oriented to person, place and time ?recent and remote memory intact ?normal attention and concentration ?language fluent, comprehension intact, naming intact ?fund of knowledge appropriate ? ?CRANIAL NERVE:  ?2nd - no papilledema on fundoscopic exam ?2nd, 3rd, 4th, 6th - pupils equal and reactive to light, visual fields full to confrontation, extraocular muscles intact, no nystagmus ?5th - facial sensation symmetric ?7th - facial strength symmetric ?8th - hearing intact ?9th - palate elevates symmetrically, uvula midline ?11th - shoulder shrug symmetric ?12th - tongue protrusion midline ? ?MOTOR:  ?normal bulk and tone, full strength in the BUE, BLE ? ?SENSORY:  ?normal and symmetric to light touch, temperature, vibration ? ?COORDINATION:  ?finger-nose-finger, fine finger movements normal ? ?REFLEXES:  ?deep tendon reflexes present and symmetric ? ?GAIT/STATION:  ?narrow based gait ? ? ? ?DIAGNOSTIC DATA (LABS, IMAGING, TESTING) ?- I reviewed patient records, labs, notes, testing and imaging myself where  available. ? ?Lab Results  ?Component Value Date  ? WBC 7.4 08/30/2021  ? HGB 13.9 08/30/2021  ? HCT 41.9 08/30/2021  ? MCV 88 08/30/2021  ? PLT 255 08/30/2021  ? ?   ?Component Value Date/Time  ? NA 141 08/30/2021 1205  ? K 3.5 08/30/2021 1205  ? CL 103 08/30/2021 1205  ? CO2 26 08/30/2021 1205  ? GLUCOSE 182 (H) 08/30/2021 1205  ? GLUCOSE 120 (H) 10/11/2020 1022  ? BUN 19 08/30/2021 1205  ? CREATININE 0.91 08/30/2021 1205  ? CALCIUM 10.5 (H) 08/30/2021 1205  ? PROT 7.2  03/29/2021 1631  ? ALBUMIN 4.6 03/29/2021 1631  ? AST 17 03/29/2021 1631  ? ALT 17 03/29/2021 1631  ? ALKPHOS 82 03/29/2021 1631  ? BILITOT 0.2 03/29/2021 1631  ? GFRNONAA >60 10/11/2020 1022  ? GFRAA 88 07/23/2020 1559  ? ?Lab Results  ?Component Value Date  ? CHOL 153 01/28/2021  ? HDL 52 01/28/2021  ? [place] 79 01/28/2021  ? LDLDIRECT 73 01/28/2021  ? TRIG 125 01/28/2021  ? CHOLHDL 4.1 02/05/2020  ? ?Lab Results  ?Component Value Date  ? HGBA1C 7.3 (H) 08/30/2021  ? ?No results found for: VITAMINB12 ?Lab Results  ?Component Value Date  ? TSH 0.499 09/11/2020  ? ? ? ? ?ASSESSMENT AND PLAN ? ?66 y.o. 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  We will proceed with further work-up and start patient on trial of carbamazepine. ? ? ?Dx: ? ?1. Left-sided trigeminal neuralgia   ? ? ? ?PLAN: ? ?LEFT TRIGEMINAL NEURALGIA (left V1, V2) ?- start carbamazepine 188m twice a day  ?- check MRI brain and trigeminal ? ?Orders Placed This Encounter  ?Procedures  ? MR BRAIN W WO CONTRAST  ? MR FACE/TRIGEMINAL WO/W CM  ? ?Meds ordered this encounter  ?Medications  ? carbamazepine (CARBATROL) 100 MG 12 hr capsule  ?  Sig: Take 1 capsule (100 mg total) by mouth 2 (two) times daily.  ?  Dispense:  60 capsule  ?  Refill:  6  ? ?Return in about 6 months (around 03/23/2022) for MyChart visit (15 min). ? ? ? ?VPenni Bombard MD  41/22/5834 46:21PM ?Certified in Neurology, Neurophysiology and Neuroimaging ? ?[facility] ?[address] ?[place] [address]?[phone] ?

## 2021-09-21 NOTE — Patient Instructions (Signed)
LEFT TRIGEMINAL NEURALGIA (left V1, V2) ?- start carbamazepine '100mg'$  twice a day  ?- check MRI brain and trigeminal ?

## 2021-09-30 ENCOUNTER — Telehealth: Payer: Self-pay | Admitting: Diagnostic Neuroimaging

## 2021-09-30 NOTE — Telephone Encounter (Signed)
Per Dr Leta Baptist, I called patient and informed her to stop daytime dose of carbatrol, take one at night and see how she feels. ?Patient verbalized understanding, appreciation. ? ?

## 2021-09-30 NOTE — Telephone Encounter (Signed)
Pt called stating that the carbamazepine (CARBATROL) 100 MG 12 hr capsule is to strong for her and that it's making her dizzy. Please advise.  ?

## 2021-09-30 NOTE — Telephone Encounter (Signed)
[employer] pending uploaded notes on the portal  ?

## 2021-10-06 DIAGNOSIS — J310 Chronic rhinitis: Secondary | ICD-10-CM | POA: Diagnosis not present

## 2021-10-06 DIAGNOSIS — J343 Hypertrophy of nasal turbinates: Secondary | ICD-10-CM | POA: Diagnosis not present

## 2021-10-06 DIAGNOSIS — J342 Deviated nasal septum: Secondary | ICD-10-CM | POA: Diagnosis not present

## 2021-10-06 DIAGNOSIS — J320 Chronic maxillary sinusitis: Secondary | ICD-10-CM | POA: Diagnosis not present

## 2021-10-06 NOTE — Telephone Encounter (Signed)
10/06/21 [employer] no auth req per [employer] ref # [id number]  ?

## 2021-10-06 NOTE — Telephone Encounter (Signed)
90 mins MRI brain w/wo contrast & MRI face/trigeminal w/wo Dr. Leta Baptist [employer]BS [place] ref: [id number] ?Scheduled with patient at [facility] 10/12/21 ?

## 2021-10-11 ENCOUNTER — Other Ambulatory Visit: Payer: Self-pay | Admitting: Otolaryngology

## 2021-10-11 DIAGNOSIS — J329 Chronic sinusitis, unspecified: Secondary | ICD-10-CM

## 2021-10-12 ENCOUNTER — Ambulatory Visit: Payer: BC Managed Care – PPO

## 2021-10-12 ENCOUNTER — Other Ambulatory Visit: Payer: Self-pay | Admitting: Diagnostic Neuroimaging

## 2021-10-12 DIAGNOSIS — G500 Trigeminal neuralgia: Secondary | ICD-10-CM | POA: Diagnosis not present

## 2021-10-14 ENCOUNTER — Encounter: Payer: Self-pay | Admitting: *Deleted

## 2021-10-15 ENCOUNTER — Ambulatory Visit: Payer: BC Managed Care – PPO | Admitting: Diagnostic Neuroimaging

## 2021-10-19 ENCOUNTER — Other Ambulatory Visit: Payer: Self-pay | Admitting: Medical

## 2021-10-19 DIAGNOSIS — Z1231 Encounter for screening mammogram for malignant neoplasm of breast: Secondary | ICD-10-CM

## 2021-11-04 ENCOUNTER — Ambulatory Visit: Payer: BC Managed Care – PPO

## 2021-11-04 ENCOUNTER — Other Ambulatory Visit: Payer: Self-pay | Admitting: Cardiology

## 2021-11-04 DIAGNOSIS — I2510 Atherosclerotic heart disease of native coronary artery without angina pectoris: Secondary | ICD-10-CM

## 2021-11-05 ENCOUNTER — Ambulatory Visit
Admission: RE | Admit: 2021-11-05 | Discharge: 2021-11-05 | Disposition: A | Discharge status: Home / Self Care | Payer: BC Managed Care – PPO | Source: Ambulatory Visit | Attending: Medical | Admitting: Medical

## 2021-11-05 DIAGNOSIS — Z1231 Encounter for screening mammogram for malignant neoplasm of breast: Secondary | ICD-10-CM | POA: Diagnosis not present

## 2021-11-10 ENCOUNTER — Ambulatory Visit
Admission: RE | Admit: 2021-11-10 | Discharge: 2021-11-10 | Disposition: A | Discharge status: Home / Self Care | Payer: BC Managed Care – PPO | Source: Ambulatory Visit | Attending: Otolaryngology | Admitting: Otolaryngology

## 2021-11-10 DIAGNOSIS — J329 Chronic sinusitis, unspecified: Secondary | ICD-10-CM | POA: Diagnosis not present

## 2021-11-10 DIAGNOSIS — I6529 Occlusion and stenosis of unspecified carotid artery: Secondary | ICD-10-CM | POA: Diagnosis not present

## 2021-11-10 DIAGNOSIS — R220 Localized swelling, mass and lump, head: Secondary | ICD-10-CM | POA: Diagnosis not present

## 2021-11-20 ENCOUNTER — Other Ambulatory Visit: Payer: Self-pay | Admitting: Medical

## 2021-11-20 DIAGNOSIS — I10 Essential (primary) hypertension: Secondary | ICD-10-CM

## 2021-11-25 DIAGNOSIS — J310 Chronic rhinitis: Secondary | ICD-10-CM | POA: Diagnosis not present

## 2021-11-25 DIAGNOSIS — J342 Deviated nasal septum: Secondary | ICD-10-CM | POA: Diagnosis not present

## 2021-11-25 DIAGNOSIS — J343 Hypertrophy of nasal turbinates: Secondary | ICD-10-CM | POA: Diagnosis not present

## 2022-01-26 ENCOUNTER — Ambulatory Visit (INDEPENDENT_AMBULATORY_CARE_PROVIDER_SITE_OTHER): Payer: BC Managed Care – PPO | Admitting: Medical

## 2022-01-26 DIAGNOSIS — E1169 Type 2 diabetes mellitus with other specified complication: Secondary | ICD-10-CM | POA: Diagnosis not present

## 2022-01-26 DIAGNOSIS — I10 Essential (primary) hypertension: Secondary | ICD-10-CM

## 2022-01-26 DIAGNOSIS — Z91199 Patient's noncompliance with other medical treatment and regimen due to unspecified reason: Secondary | ICD-10-CM

## 2022-01-26 DIAGNOSIS — E1165 Type 2 diabetes mellitus with hyperglycemia: Secondary | ICD-10-CM | POA: Diagnosis not present

## 2022-01-26 DIAGNOSIS — E785 Hyperlipidemia, unspecified: Secondary | ICD-10-CM | POA: Diagnosis not present

## 2022-01-26 NOTE — Progress Notes (Signed)
Subjective:  Penny Rice is a 66 y.o. female who presents for Chief Complaint  Patient presents with   issues with medication    Issues with medication (not sure which one) but being fatigue a lot. Declines flu shot today     Last visit 08/2021.  Here for concern about medication.   In past visits she has often felt that several medications didn't agree with her.  We have had to try various things in the past.    Today she notes she stopped Olmesartan Amlodipine HCTZ (tribenzor) a week ago.  Felt like it made her feel bad.    She does note compliance with Carvedilol BP medication  She notes compliance with metformin   She is not taking Rosuvastatin crestor.  She thinks she has not been taking this for months  Checking BPs at home.  Numbers are "pretty good" but doesn't have a specific number.  Blood sugars typically 130s.  Sees neurologist again in October  Left sinus still bothers her.    After last visit here she ended up seeing neurology and ENT for head pains and facial swelling.   No specific causes reportedly found.    No other aggravating or relieving factors.    No other c/o.  Past Medical History:  Diagnosis Date   Allergy    Arthritis    Diabetes mellitus without complication ([facility])    Facial pain    Hyperlipidemia    Hypertension    Vitamin D deficiency    Current Outpatient Medications on File Prior to Visit  Medication Sig Dispense Refill   aspirin EC 81 MG tablet Take 1 tablet (81 mg total) by mouth daily. 90 tablet 3   carbamazepine (CARBATROL) 100 MG 12 hr capsule Take 1 capsule (100 mg total) by mouth 2 (two) times daily. 60 capsule 6   carvedilol (COREG) 25 MG tablet TAKE 1 TABLET(25 MG) BY MOUTH TWICE DAILY WITH A MEAL 180 tablet 0   fluticasone (FLONASE) 50 MCG/ACT nasal spray Place 2 sprays into both nostrils daily.     metFORMIN (GLUCOPHAGE) 500 MG tablet TAKE 1 TABLET(500 MG) BY MOUTH DAILY WITH BREAKFAST 90 tablet 1   Olmesartan-amLODIPine-HCTZ  40-10-25 MG TABS TAKE 1 TABLET BY MOUTH DAILY 90 tablet 1   blood glucose meter kit and supplies Dispense based on patient and insurance preference. Use up to twice a daily as directed. (FOR ICD-10 E10.9, E11.9). 1 each 0   rosuvastatin (CRESTOR) 40 MG tablet TAKE 1 TABLET(40 MG) BY MOUTH AT BEDTIME 90 tablet 0   [DISCONTINUED] potassium chloride (KLOR-CON) 10 MEQ tablet Take 1 tablet (10 mEq total) by mouth daily for 30 doses. 30 tablet 0   No current facility-administered medications on file prior to visit.     The following portions of the patient's history were reviewed and updated as appropriate: allergies, current medications, past family history, past medical history, past social history, past surgical history and problem list.  ROS Otherwise as in subjective above  Objective: BP 122/60   Pulse 64   Wt 186 lb 6.4 oz (84.6 kg)   BMI 32.00 kg/m   Wt Readings from Last 3 Encounters:  01/26/22 186 lb 6.4 oz (84.6 kg)  09/21/21 183 lb 3.2 oz (83.1 kg)  08/30/21 183 lb 3.2 oz (83.1 kg)   General appearance: alert, no distress, well developed, well nourished HEENT: normocephalic, sclerae anicteric, conjunctiva pink and moist, TMs pearly, nares patent, no discharge or erythema, pharynx normal Neck: supple,  no lymphadenopathy, no thyromegaly, no masses Heart: RRR, normal S1, S2, no murmurs Lungs: CTA bilaterally, no wheezes, rhonchi, or rales Pulses: 2+ radial pulses, 2+ pedal pulses, normal cap refill Ext: no edema   Assessment: Encounter Diagnoses  Name Primary?   Serum calcium elevated Yes   Hypertension, unspecified type    Type 2 diabetes mellitus with hyperglycemia, without long-term current use of insulin ([place])    Dyslipidemia associated with type 2 diabetes mellitus ([place])    Noncompliance      Plan: We discussed her medication list and her concerns.  Numerous times in the past including today there has been issues with noncompliance.  She will report that the  medicine is not agreeing with her own to stop it on her own.  She stopped her Tribenzor a week ago as she felt like it was possibly making her feel bad.  She sees cardiology next week.  Labs today.  Continue carvedilol.  She will check her blood pressure and write her blood pressures down daily and will take this this with her to cardiology next week.  I get the impression she is not going to start back Tribenzor until she sees cardiology  Hyperlipidemia-updated labs today.  She has not been compliant with Crestor  Diabetes-updated labs today, continue metformin once daily.  After last visit in the spring 2023, I advised she go up to twice daily but she has only been doing this once daily.  Last visit her calcium was elevated.  We will check some additional labs today.  Labrenda was seen today for issues with medication.  Diagnoses and all orders for this visit:  Serum calcium elevated -     PTH, Intact and Calcium  Hypertension, unspecified type -     Comprehensive metabolic panel -     PTH, Intact and Calcium  Type 2 diabetes mellitus with hyperglycemia, without long-term current use of insulin ([facility]) -     Hemoglobin A1c  Dyslipidemia associated with type 2 diabetes mellitus ([place]) -     Lipid panel  Noncompliance   Follow up: pending labs

## 2022-01-27 ENCOUNTER — Other Ambulatory Visit: Payer: Self-pay | Admitting: Medical

## 2022-01-27 DIAGNOSIS — I2510 Atherosclerotic heart disease of native coronary artery without angina pectoris: Secondary | ICD-10-CM

## 2022-01-27 DIAGNOSIS — E782 Mixed hyperlipidemia: Secondary | ICD-10-CM

## 2022-01-27 DIAGNOSIS — E1165 Type 2 diabetes mellitus with hyperglycemia: Secondary | ICD-10-CM

## 2022-01-27 DIAGNOSIS — I10 Essential (primary) hypertension: Secondary | ICD-10-CM

## 2022-01-27 LAB — COMPREHENSIVE METABOLIC PANEL
ALT: 17 IU/L (ref 0–32)
AST: 13 IU/L (ref 0–40)
Albumin/Globulin Ratio: 1.8 (ref 1.2–2.2)
Albumin: 4.2 g/dL (ref 3.9–4.9)
Alkaline Phosphatase: 89 IU/L (ref 44–121)
BUN/Creatinine Ratio: 17 (ref 12–28)
BUN: 14 mg/dL (ref 8–27)
Bilirubin Total: <0.2 mg/dL (ref 0.0–1.2)
CO2: 25 mmol/L (ref 20–29)
Calcium: 9.9 mg/dL (ref 8.7–10.3)
Chloride: 101 mmol/L (ref 96–106)
Creatinine, Ser: 0.82 mg/dL (ref 0.57–1.00)
Globulin, Total: 2.3 g/dL (ref 1.5–4.5)
Glucose: 145 mg/dL — ABNORMAL HIGH (ref 70–99)
Potassium: 3.7 mmol/L (ref 3.5–5.2)
Sodium: 140 mmol/L (ref 134–144)
Total Protein: 6.5 g/dL (ref 6.0–8.5)
eGFR: 80 mL/min/{1.73_m2} (ref >59)

## 2022-01-27 LAB — LIPID PANEL
Chol/HDL Ratio: 5 ratio — ABNORMAL HIGH (ref 0.0–4.4)
Cholesterol, Total: 242 mg/dL — ABNORMAL HIGH (ref 100–199)
HDL: 48 mg/dL (ref >39)
LDL Chol Calc (NIH): 163 mg/dL — ABNORMAL HIGH (ref 0–99)
Triglycerides: 172 mg/dL — ABNORMAL HIGH (ref 0–149)
VLDL Cholesterol Cal: 31 mg/dL (ref 5–40)

## 2022-01-27 LAB — HEMOGLOBIN A1C
Est. average glucose Bld gHb Est-mCnc: 146 mg/dL
Hgb A1c MFr Bld: 6.7 % — ABNORMAL HIGH (ref 4.8–5.6)

## 2022-01-27 LAB — PTH, INTACT AND CALCIUM: PTH: 82 pg/mL — ABNORMAL HIGH (ref 15–65)

## 2022-01-27 MED ORDER — CARVEDILOL 25 MG PO TABS: ORAL_TABLET | ORAL | 0 refills | Status: DC

## 2022-01-27 MED ORDER — ROSUVASTATIN CALCIUM 40 MG PO TABS: ORAL_TABLET | ORAL | 2 refills | Status: DC

## 2022-01-27 MED ORDER — METFORMIN HCL 500 MG PO TABS: ORAL_TABLET | ORAL | 1 refills | Status: DC

## 2022-01-31 ENCOUNTER — Ambulatory Visit: Payer: BC Managed Care – PPO | Admitting: Cardiology

## 2022-01-31 ENCOUNTER — Encounter: Payer: Self-pay | Admitting: Cardiology

## 2022-01-31 VITALS — BP 170/101 | HR 71 | Temp 98.2°F | Resp 16 | Ht 64.0 in | Wt 185.0 lb

## 2022-01-31 DIAGNOSIS — E782 Mixed hyperlipidemia: Secondary | ICD-10-CM

## 2022-01-31 DIAGNOSIS — E6609 Other obesity due to excess calories: Secondary | ICD-10-CM | POA: Diagnosis not present

## 2022-01-31 DIAGNOSIS — I2510 Atherosclerotic heart disease of native coronary artery without angina pectoris: Secondary | ICD-10-CM | POA: Diagnosis not present

## 2022-01-31 DIAGNOSIS — I10 Essential (primary) hypertension: Secondary | ICD-10-CM | POA: Diagnosis not present

## 2022-01-31 DIAGNOSIS — E1165 Type 2 diabetes mellitus with hyperglycemia: Secondary | ICD-10-CM

## 2022-01-31 NOTE — Progress Notes (Signed)
ID:  Penny Rice, DOB [birth year], MRN [id number]  PCP:  Carlena Hurl, PA-C  Cardiologist:  Rex Kras, DO, [facility] (established care 07/16/2020)  Date: 01/31/22 Last Office Visit: 08/03/2021   Chief Complaint  Patient presents with   Coronary Artery Disease   Coronary artery calcification   Follow-up   HPI  Penny Rice is a 66 y.o. female whose past medical history and cardiovascular risk factors include: Severe coronary artery calcification, atherosclerosis of the coronary arteries per coronary CTA, HTN, HLD, NIDDM, postmenopausal female, obesity due to excess calories.  In the past patient was referred to the practice for evaluation of chest pain and hypertension.  She has undergone ischemic work-up as outlined below including a coronary CTA which noted severe coronary artery calcification and nonobstructive CAD.  CT FFR was negative for hemodynamically significant stenosis and patient was educated on importance of secondary prevention.  Her cholesterol medications were also uptitrated to the point that her LDL was 73 mg/dL at the time of the last office visit.  She now presents for 30-monthfollow-up visit.  She denies anginal discomfort or heart failure symptoms.  But for reasons unknown she has stopped taking rosuvastatin, aspirin, olmesartan/amlodipine/HCTZ.  Recent labs from August 2023 independently reviewed which notes uncontrolled hyperlipidemia.  She still has not restarted rosuvastatin but has a prescription available at the pharmacy for pickup.  FUNCTIONAL STATUS: Works at [employer] does heavy lifting. No structured exercise program or daily routine.   ALLERGIES: No Known Allergies  MEDICATION LIST PRIOR TO VISIT: Current Meds  Medication Sig   aspirin EC 81 MG tablet Take 1 tablet (81 mg total) by mouth daily.   blood glucose meter kit and supplies Dispense based on patient and insurance preference. Use up to twice a daily as directed. (FOR ICD-10 E10.9, E11.9).    carvedilol (COREG) 25 MG tablet TAKE 1 TABLET(25 MG) BY MOUTH TWICE DAILY WITH A MEAL   fluticasone (FLONASE) 50 MCG/ACT nasal spray Place 2 sprays into both nostrils daily.   metFORMIN (GLUCOPHAGE) 500 MG tablet TAKE 1 TABLET(500 MG) BY MOUTH DAILY WITH BREAKFAST   Olmesartan-amLODIPine-HCTZ 40-10-25 MG TABS TAKE 1 TABLET BY MOUTH DAILY   rosuvastatin (CRESTOR) 40 MG tablet TAKE 1 TABLET(40 MG) BY MOUTH AT BEDTIME     PAST MEDICAL HISTORY: Past Medical History:  Diagnosis Date   Allergy    Arthritis    Diabetes mellitus without complication (HCC)    Facial pain    Hyperlipidemia    Hypertension    Vitamin D deficiency     PAST SURGICAL HISTORY: Past Surgical History:  Procedure Laterality Date   ABDOMINAL HYSTERECTOMY     pt reports full   COLONOSCOPY  2016   Dr. MLucio Edward  COLONOSCOPY  02/18/2020   POLYPECTOMY     TOOTH EXTRACTION  2022   2 teeth pulled   TOTAL ABDOMINAL HYSTERECTOMY     per pt    FAMILY HISTORY: The patient family history includes Cancer in her son; Diabetes in her sister; Hypertension in her mother, sister, sister, sister, sister, and sister; Other in her father.  SOCIAL HISTORY:  The patient  reports that she has never smoked. She has never used smokeless tobacco. She reports that she does not drink alcohol and does not use drugs.  REVIEW OF SYSTEMS: Review of Systems  HENT:         Sinus pain  Cardiovascular:  Negative for chest pain, dyspnea on exertion, leg swelling,  near-syncope, orthopnea, palpitations, paroxysmal nocturnal dyspnea and syncope.  Respiratory:  Negative for shortness of breath.   Neurological:  Positive for headaches.   PHYSICAL EXAM:    01/31/2022    2:32 PM 01/31/2022    2:22 PM 01/26/2022    2:42 PM  Vitals with BMI  Height  '5\' 4"'    Weight  185 lbs 186 lbs 6 oz  BMI  16.10   Systolic 960 454 098  Diastolic 119 147 60  Pulse 71 76 64    Physical Exam  Constitutional: No distress.  Age appropriate,  hemodynamically stable.   Neck: No JVD present.  Cardiovascular: Normal rate, regular rhythm, S1 normal, S2 normal, intact distal pulses and normal pulses. Exam reveals no gallop, no S3 and no S4.  No murmur heard. Pulmonary/Chest: Effort normal and breath sounds normal. No stridor. She has no wheezes. She has no rales.  Abdominal: Soft. Bowel sounds are normal. She exhibits no distension. There is no abdominal tenderness.  Musculoskeletal:        General: No edema.     Cervical back: Neck supple.  Neurological: She is alert and oriented to person, place, and time. She has intact cranial nerves (2-12).  Skin: Skin is warm and moist.   CARDIAC DATABASE: EKG: 01/31/22 Sinus rhythm, 78 bpm, diffuse nonspecific T wave abnormality, no significant change compared to prior ECG.  Echocardiogram: 07/30/2020:  Left ventricle cavity is normal in size. Mild concentric hypertrophy of the left ventricle. Normal global wall motion. Normal LV systolic function with EF 55%. Doppler evidence of grade I diastolic dysfunction, normal LAP.  Left atrial cavity is mildly dilated. Mild (Grade I) mitral regurgitation. Mild tricuspid regurgitation.  No evidence of pulmonary hypertension.  Stress Testing: No results found for this or any previous visit from the past 1095 days.  Heart Catheterization: None  Renal artery duplex  07/30/2020:  No evidence of renal artery occlusive disease in either renal artery. Normal intrarenal vascular perfusion is noted in both kidneys. Renal length is within normal limits for both kidneys. Normal abdominal aorta flow velocities noted. No significant atherosclerotic plaque noted in the abdominal aorta.   Coronary CTA 08/11/2020:  1. Total coronary calcium score of 1404. This was 99th percentile for age and sex matched control. 2. Normal coronary origin with right dominance. 3. CAD-RADS 4. Approximately 70 percent stenosis in the proximal LAD due to mixed plaque. Ramus  intermedius overall patent with diffuse calcified and noncalcified plaque. Moderate stenosis (50-69%) at the ostial CX due to calcified plaque. Diffuse calcified and noncalcified plaque within the proximal to mid RCA. Mild stenosis (25-49%) in distal RCA due to mixed plaque. 4. Aortic atherosclerosis within the ascending and the descending thoracic aorta. CT FFR analysis showed no significant stenosis in the proximal LAD or LCx distribution.  LABORATORY DATA:    Latest Ref Rng & Units 08/30/2021   12:05 PM 10/11/2020   10:22 AM 09/11/2020    6:57 PM  CBC  WBC 3.4 - 10.8 x10E3/uL 7.4  7.7  8.9   Hemoglobin 11.1 - 15.9 g/dL 13.9  13.9  12.9   Hematocrit 34.0 - 46.6 % 41.9  42.0  39.2   Platelets 150 - 450 x10E3/uL 255  244  253        Latest Ref Rng & Units 01/26/2022    3:16 PM 08/30/2021   12:05 PM 03/29/2021    4:31 PM  CMP  Glucose 70 - 99 mg/dL 145  182  152  BUN 8 - 27 mg/dL '14  19  23   ' Creatinine 0.57 - 1.00 mg/dL 0.82  0.91  1.37   Sodium 134 - 144 mmol/L 140  141  142   Potassium 3.5 - 5.2 mmol/L 3.7  3.5  3.6   Chloride 96 - 106 mmol/L 101  103  100   CO2 20 - 29 mmol/L '25  26  27   ' Calcium 8.7 - 10.3 mg/dL 9.9  10.5  10.2   Total Protein 6.0 - 8.5 g/dL 6.5   7.2   Total Bilirubin 0.0 - 1.2 mg/dL <0.2   0.2   Alkaline Phos 44 - 121 IU/L 89   82   AST 0 - 40 IU/L 13   17   ALT 0 - 32 IU/L 17   17     Lipid Panel     Component Value Date/Time   CHOL 242 (H) 01/26/2022 1516   TRIG 172 (H) 01/26/2022 1516   HDL 48 01/26/2022 1516   CHOLHDL 5.0 (H) 01/26/2022 1516   LDLCALC 163 (H) 01/26/2022 1516   LDLDIRECT 73 01/28/2021 0939   LABVLDL 31 01/26/2022 1516    No components found for: "NTPROBNP" No results for input(s): "PROBNP" in the last 8760 hours. No results for input(s): "TSH" in the last 8760 hours.   BMP Recent Labs    03/29/21 1631 08/30/21 1205 01/26/22 1516  NA 142 141 140  K 3.6 3.5 3.7  CL 100 103 101  CO2 '27 26 25  ' GLUCOSE 152* 182* 145*   BUN '23 19 14  ' CREATININE 1.37* 0.91 0.82  CALCIUM 10.2 10.5* 9.9    HEMOGLOBIN A1C Lab Results  Component Value Date   HGBA1C 6.7 (H) 01/26/2022    IMPRESSION:    ICD-10-CM   1. Atherosclerosis of native coronary artery of native heart without angina pectoris  I25.10 EKG 12-Lead    Ambulatory referral to Sleep Studies    2. Coronary atherosclerosis due to calcified coronary lesion of native artery  I25.10 Ambulatory referral to Sleep Studies   I25.84     3. Benign hypertension  I10 Ambulatory referral to Sleep Studies    4. Mixed hyperlipidemia  E78.2     5. Type 2 diabetes mellitus with hyperglycemia, without long-term current use of insulin (HCC)  E11.65     6. Class 1 obesity due to excess calories with serious comorbidity and body mass index (BMI) of 31.0 to 31.9 in adult  E66.09    Z68.31        RECOMMENDATIONS: JAHNYLA PARRILLO is a 67 y.o. female whose past medical history and cardiac risk factors include: Severe coronary artery calcification, atherosclerosis of the coronary arteries per coronary CTA, HTN, HLD, NIDDM, postmenopausal female, obesity due to excess calories.  Atherosclerosis of native coronary artery of native heart without angina pectoris /severe CAC: Total coronary artery calcium score 1404. Noted to have CAD RADS 4; CT FFR negative for hemodynamically significant stenosis. Restart aspirin 81 mg p.o. daily. Patient will pick up rosuvastatin and start it later today. We will recheck fasting lipid profile in 6 weeks. No additional ischemic work-up warranted at this time as she is currently asymptomatic. Educated her on the importance of secondary prevention including but not limited to blood pressure management, glycemic/lipid management, and weight loss.  Benign hypertension Office blood pressures are not well controlled. Likely secondary to stopping her olmesartan/amlodipine/HCTZ. Her systolic blood pressures bilaterally are 170 mmHg. No focal  neurological deficits on  physical examination. Patient is aware that systolic blood pressures greater than 160 mmHg are associated with strokes.  And if she has any change in facial asymmetry, expressive or receptive aphasia, focal neurological deficits, chest pain, shortness of breath, symptoms suggestive of aortic syndromes she is to call 911 and go to the closest ER via EMS.  I have asked her to restart her olmesartan/amlodipine/HCTZ. She will log of her blood pressures twice a day and to bring it in at the office. We will refer her to sleep medicine. Reeducated her on the importance of a low-salt diet-breakfast is usually takeout and high in sodium content.   Mixed hyperlipidemia Restarting rosuvastatin.  She stopped taking the medications for reasons unknown Most recent lipids from August 2023, independently reviewed as noted above. Recommended goal LDL less than or equal to 55 mg/dL given her diabetes, severe CAC, and CAD. Currently managed by primary care provider.  FINAL MEDICATION LIST END OF ENCOUNTER: No orders of the defined types were placed in this encounter.     Current Outpatient Medications:    aspirin EC 81 MG tablet, Take 1 tablet (81 mg total) by mouth daily., Disp: 90 tablet, Rfl: 3   blood glucose meter kit and supplies, Dispense based on patient and insurance preference. Use up to twice a daily as directed. (FOR ICD-10 E10.9, E11.9)., Disp: 1 each, Rfl: 0   carvedilol (COREG) 25 MG tablet, TAKE 1 TABLET(25 MG) BY MOUTH TWICE DAILY WITH A MEAL, Disp: 180 tablet, Rfl: 0   fluticasone (FLONASE) 50 MCG/ACT nasal spray, Place 2 sprays into both nostrils daily., Disp: , Rfl:    metFORMIN (GLUCOPHAGE) 500 MG tablet, TAKE 1 TABLET(500 MG) BY MOUTH DAILY WITH BREAKFAST, Disp: 90 tablet, Rfl: 1   Olmesartan-amLODIPine-HCTZ 40-10-25 MG TABS, TAKE 1 TABLET BY MOUTH DAILY, Disp: 90 tablet, Rfl: 1   rosuvastatin (CRESTOR) 40 MG tablet, TAKE 1 TABLET(40 MG) BY MOUTH AT BEDTIME, Disp:  30 tablet, Rfl: 2  Orders Placed This Encounter  Procedures   Ambulatory referral to Sleep Studies   EKG 12-Lead    There are no Patient Instructions on file for this visit.   --Continue cardiac medications as reconciled in final medication list. --Return in about 2 weeks (around 02/14/2022) for Follow up, BP, CAD. Or sooner if needed. --Continue follow-up with your primary care physician regarding the management of your other chronic comorbid conditions.  Patient's questions and concerns were addressed to her satisfaction. She voices understanding of the instructions provided during this encounter.   This note was created using a voice recognition software as a result there may be grammatical errors inadvertently enclosed that do not reflect the nature of this encounter. Every attempt is made to correct such errors.  Rex Kras, [place], [facility]  Pager: [phone] Office: [phone]

## 2022-02-02 ENCOUNTER — Other Ambulatory Visit: Payer: Self-pay | Admitting: Medical

## 2022-02-03 ENCOUNTER — Telehealth: Payer: Self-pay

## 2022-02-03 NOTE — Telephone Encounter (Signed)
Pt called to give [place] her bp log in: 08/28-9:00pm 161/108  08/29-7:00AM 185/117, 9:00PM 149/98  08/30-8:00AM 99/70, 6PM 143/100  08/31-7:00AM 153/90, 3:45PM 98/85

## 2022-02-11 ENCOUNTER — Telehealth: Payer: Self-pay | Admitting: Cardiology

## 2022-02-11 NOTE — Telephone Encounter (Signed)
Patient says someone called her. No one upfront did, whoever called her may try her again.

## 2022-02-11 NOTE — Telephone Encounter (Signed)
Called pt to clarify bp for 08/30. Pt bp was 08/30-8:00AM 99/70, 6PM 143/100. Pt mention she is also taking her bp medications.

## 2022-02-12 NOTE — Telephone Encounter (Signed)
Have her continue to keep a blood pressure log and bring her medication at the next visit.  Nura Cahoon [place], DO, [facility]

## 2022-02-15 NOTE — Telephone Encounter (Signed)
Called pt and is aware 

## 2022-02-18 ENCOUNTER — Encounter: Payer: Self-pay | Admitting: Cardiology

## 2022-02-18 ENCOUNTER — Ambulatory Visit: Payer: BC Managed Care – PPO | Admitting: Cardiology

## 2022-02-18 VITALS — BP 126/76 | HR 73 | Resp 16 | Ht 64.0 in | Wt 184.0 lb

## 2022-02-18 DIAGNOSIS — I2510 Atherosclerotic heart disease of native coronary artery without angina pectoris: Secondary | ICD-10-CM | POA: Diagnosis not present

## 2022-02-18 DIAGNOSIS — I2584 Coronary atherosclerosis due to calcified coronary lesion: Secondary | ICD-10-CM | POA: Diagnosis not present

## 2022-02-18 DIAGNOSIS — E1165 Type 2 diabetes mellitus with hyperglycemia: Secondary | ICD-10-CM

## 2022-02-18 DIAGNOSIS — E782 Mixed hyperlipidemia: Secondary | ICD-10-CM

## 2022-02-18 DIAGNOSIS — I10 Essential (primary) hypertension: Secondary | ICD-10-CM

## 2022-02-18 MED ORDER — HYDROCHLOROTHIAZIDE 25 MG PO TABS: 25.0000 mg | ORAL_TABLET | Freq: Every morning | ORAL | 0 refills | Status: DC

## 2022-02-18 MED ORDER — OLMESARTAN MEDOXOMIL 40 MG PO TABS: 40.0000 mg | ORAL_TABLET | Freq: Every evening | ORAL | 0 refills | Status: DC

## 2022-02-18 NOTE — Progress Notes (Signed)
ID:  CYANNA Rice, DOB 1956/01/25, MRN [id number]  PCP:  Carlena Hurl, PA-C  Cardiologist:  Rex Kras, DO, [facility] (established care 07/16/2020)  Date: 02/18/22 Last Office Visit: 01/31/2022  Chief Complaint  Patient presents with   Hypertension   Follow-up   HPI  P[employer] is a 66 y.o. female whose past medical history and cardiovascular risk factors include: Severe coronary artery calcification, atherosclerosis of the coronary arteries per coronary CTA, HTN, HLD, NIDDM, postmenopausal female, obesity due to excess calories.  Patient was referred to the practice for evaluation of chest pain and hypertension.  She did undergo ischemic work-up given her severe coronary artery calcification.  Coronary CTA noted nonobstructive CAD and CT FFR was negative for hemodynamically significant stenosis.  She was started on statin therapy.   Since last office visit patient states that her blood pressures are not well controlled and quite labile and therefore presents to the office for reevaluation.  She currently takes olmesartan/amlodipine/HCTZ in the morning with breakfast, carvedilol with lunch and dinner.  Her evening numbers are usually low compared to the morning numbers.  She feels dizzy at times.   FUNCTIONAL STATUS: Works at [employer] and does heavy lifting. No structured exercise program or daily routine.   ALLERGIES: No Known Allergies  MEDICATION LIST PRIOR TO VISIT: Current Meds  Medication Sig   aspirin EC 81 MG tablet Take 1 tablet (81 mg total) by mouth daily.   blood glucose meter kit and supplies Dispense based on patient and insurance preference. Use up to twice a daily as directed. (FOR ICD-10 E10.9, E11.9).   carvedilol (COREG) 25 MG tablet TAKE 1 TABLET(25 MG) BY MOUTH TWICE DAILY WITH A MEAL   fluticasone (FLONASE) 50 MCG/ACT nasal spray Place 2 sprays into both nostrils daily.   hydrochlorothiazide (HYDRODIURIL) 25 MG tablet Take 1 tablet (25 mg total) by mouth in the  morning.   metFORMIN (GLUCOPHAGE) 500 MG tablet TAKE 1 TABLET(500 MG) BY MOUTH DAILY WITH BREAKFAST   olmesartan (BENICAR) 40 MG tablet Take 1 tablet (40 mg total) by mouth every evening.   rosuvastatin (CRESTOR) 40 MG tablet TAKE 1 TABLET(40 MG) BY MOUTH AT BEDTIME   [DISCONTINUED] Olmesartan-amLODIPine-HCTZ 40-10-25 MG TABS TAKE 1 TABLET BY MOUTH DAILY     PAST MEDICAL HISTORY: Past Medical History:  Diagnosis Date   Allergy    Arthritis    Diabetes mellitus without complication (HCC)    Facial pain    Hyperlipidemia    Hypertension    Vitamin D deficiency     PAST SURGICAL HISTORY: Past Surgical History:  Procedure Laterality Date   ABDOMINAL HYSTERECTOMY     pt reports full   COLONOSCOPY  2016   Dr. Lucio Edward   COLONOSCOPY  02/18/2020   POLYPECTOMY     TOOTH EXTRACTION  2022   2 teeth pulled   TOTAL ABDOMINAL HYSTERECTOMY     per pt    FAMILY HISTORY: The patient family history includes Cancer in her son; Diabetes in her sister; Hypertension in her mother, sister, sister, sister, sister, and sister; Other in her father.  SOCIAL HISTORY:  The patient  reports that she has never smoked. She has never used smokeless tobacco. She reports that she does not drink alcohol and does not use drugs.  REVIEW OF SYSTEMS: Review of Systems  Cardiovascular:  Negative for chest pain, dyspnea on exertion, leg swelling, near-syncope, orthopnea, palpitations, paroxysmal nocturnal dyspnea and syncope.  Respiratory:  Negative for shortness  of breath.   Neurological:  Positive for dizziness and light-headedness.   PHYSICAL EXAM:    02/18/2022    2:54 PM 01/31/2022    2:32 PM 01/31/2022    2:22 PM  Vitals with BMI  Height _0   _1   Weight 184 lbs  185 lbs  BMI 62.56  38.93  Systolic 734 287 681  Diastolic 76 157 262  Pulse 73 71 76    Physical Exam  Constitutional: No distress.  Age appropriate, hemodynamically stable.   Neck: No JVD present.  Cardiovascular: Normal  rate, regular rhythm, S1 normal, S2 normal, intact distal pulses and normal pulses. Exam reveals no gallop, no S3 and no S4.  No murmur heard. Pulmonary/Chest: Effort normal and breath sounds normal. No stridor. She has no wheezes. She has no rales.  Abdominal: Soft. Bowel sounds are normal. She exhibits no distension. There is no abdominal tenderness.  Musculoskeletal:        General: No edema.     Cervical back: Neck supple.  Neurological: She is alert and oriented to person, place, and time. She has intact cranial nerves (2-12).  Skin: Skin is warm and moist.   CARDIAC DATABASE: EKG: 01/31/22 Sinus rhythm, 78 bpm, diffuse nonspecific T wave abnormality, no significant change compared to prior ECG.  Echocardiogram: 07/30/2020:  Left ventricle cavity is normal in size. Mild concentric hypertrophy of the left ventricle. Normal global wall motion. Normal LV systolic function with EF 55%. Doppler evidence of grade I diastolic dysfunction, normal LAP.  Left atrial cavity is mildly dilated. Mild (Grade I) mitral regurgitation. Mild tricuspid regurgitation.  No evidence of pulmonary hypertension.  Stress Testing: No results found for this or any previous visit from the past 1095 days.  Heart Catheterization: None  Renal artery duplex  07/30/2020:  No evidence of renal artery occlusive disease in either renal artery. Normal intrarenal vascular perfusion is noted in both kidneys. Renal length is within normal limits for both kidneys. Normal abdominal aorta flow velocities noted. No significant atherosclerotic plaque noted in the abdominal aorta.   Coronary CTA 08/11/2020:  1. Total coronary calcium score of 1404. This was 99th percentile for age and sex matched control. 2. Normal coronary origin with right dominance. 3. CAD-RADS 4. Approximately 70 percent stenosis in the proximal LAD due to mixed plaque. Ramus intermedius overall patent with diffuse calcified and noncalcified plaque.  Moderate stenosis (50-69%) at the ostial CX due to calcified plaque. Diffuse calcified and noncalcified plaque within the proximal to mid RCA. Mild stenosis (25-49%) in distal RCA due to mixed plaque. 4. Aortic atherosclerosis within the ascending and the descending thoracic aorta. CT FFR analysis showed no significant stenosis in the proximal LAD or LCx distribution.  LABORATORY DATA:    Latest Ref Rng & Units 08/30/2021   12:05 PM 10/11/2020   10:22 AM 09/11/2020    6:57 PM  CBC  WBC 3.4 - 10.8 x10E3/uL 7.4  7.7  8.9   Hemoglobin 11.1 - 15.9 g/dL 13.9  13.9  12.9   Hematocrit 34.0 - 46.6 % 41.9  42.0  39.2   Platelets 150 - 450 x10E3/uL 255  244  253        Latest Ref Rng & Units 01/26/2022    3:16 PM 08/30/2021   12:05 PM 03/29/2021    4:31 PM  CMP  Glucose 70 - 99 mg/dL 145  182  152   BUN 8 - 27 mg/dL 14  19  23  Creatinine 0.57 - 1.00 mg/dL 0.82  0.91  1.37   Sodium 134 - 144 mmol/L 140  141  142   Potassium 3.5 - 5.2 mmol/L 3.7  3.5  3.6   Chloride 96 - 106 mmol/L 101  103  100   CO2 20 - 29 mmol/L _0 Calcium 8.7 - 10.3 mg/dL 9.9  10.5  10.2   Total Protein 6.0 - 8.5 g/dL 6.5   7.2   Total Bilirubin 0.0 - 1.2 mg/dL <0.2   0.2   Alkaline Phos 44 - 121 IU/L 89   82   AST 0 - 40 IU/L 13   17   ALT 0 - 32 IU/L 17   17     Lipid Panel     Component Value Date/Time   CHOL 242 (H) 01/26/2022 1516   TRIG 172 (H) 01/26/2022 1516   HDL 48 01/26/2022 1516   CHOLHDL 5.0 (H) 01/26/2022 1516   LDLCALC 163 (H) 01/26/2022 1516   LDLDIRECT 73 01/28/2021 0939   LABVLDL 31 01/26/2022 1516    No components found for: "NTPROBNP" No results for input(s): "PROBNP" in the last 8760 hours. No results for input(s): "TSH" in the last 8760 hours.   BMP Recent Labs    03/29/21 1631 08/30/21 1205 01/26/22 1516  NA 142 141 140  K 3.6 3.5 3.7  CL 100 103 101  CO2 _1 GLUCOSE 152* 182* 145*  BUN _2 CREATININE 1.37* 0.91 0.82  CALCIUM 10.2 10.5* 9.9     HEMOGLOBIN A1C Lab Results  Component Value Date   HGBA1C 6.7 (H) 01/26/2022    IMPRESSION:    ICD-10-CM   1. Benign hypertension  I10 olmesartan (BENICAR) 40 MG tablet    hydrochlorothiazide (HYDRODIURIL) 25 MG tablet    2. Atherosclerosis of native coronary artery of native heart without angina pectoris  I25.10     3. Coronary atherosclerosis due to calcified coronary lesion of native artery  I25.10    I25.84     4. Mixed hyperlipidemia  E78.2     5. Type 2 diabetes mellitus with hyperglycemia, without long-term current use of insulin (HCC)  E11.65        RECOMMENDATIONS: BIRDIA JAYCOX is a 66 y.o. female whose past medical history and cardiac risk factors include: Severe coronary artery calcification, atherosclerosis of the coronary arteries per coronary CTA, HTN, HLD, NIDDM, postmenopausal female, obesity due to excess calories.  Benign hypertension In the past her BP used to be around 180 mmHg and they have improved but still remain labile. She is currently taking hydrochlorothiazide/amlodipine/olmesartan in the morning with breakfast.  And carvedilol with lunch and dinner.  Given her symptoms of dizziness will discontinue HCTZ/amlodipine/olmesartan. We will restart HCTZ in the morning and olmesartan in the evening and continue carvedilol twice daily Reemphasized the importance of low-salt diet. Patient is asked to keep a log of her blood pressures. She will office back with readings or if she continues to have symptoms of dizziness.  Atherosclerosis of native coronary artery of native heart without angina pectoris /severe CAC Total CAC 1404. CAD RADS 4, CT FFR negative for hemodynamically significant stenosis. Continue aspirin and statin therapy. We will repeat fasting lipid profile in 6 weeks. Denies angina pectoris. Educated her on the importance of secondary prevention including but not limited to blood pressure management, glycemic/lipid management, and weight  loss. Monitor for now.  Mixed hyperlipidemia Currently on  rosuvastatin.   She denies myalgia or other side effects. We will check a fasting lipid profile in 6 weeks.  Type 2 diabetes mellitus with hyperglycemia, without long-term current use of insulin ([place]) Reemphasized the importance of glycemic control. Currently managed by primary care provider. Continue ARB, statin therapy May consider [name] if clinically indicated.  Will defer to PCP  FINAL MEDICATION LIST END OF ENCOUNTER: Meds ordered this encounter  Medications   olmesartan (BENICAR) 40 MG tablet    Sig: Take 1 tablet (40 mg total) by mouth every evening.    Dispense:  90 tablet    Refill:  0   hydrochlorothiazide (HYDRODIURIL) 25 MG tablet    Sig: Take 1 tablet (25 mg total) by mouth in the morning.    Dispense:  90 tablet    Refill:  0      Current Outpatient Medications:    aspirin EC 81 MG tablet, Take 1 tablet (81 mg total) by mouth daily., Disp: 90 tablet, Rfl: 3   blood glucose meter kit and supplies, Dispense based on patient and insurance preference. Use up to twice a daily as directed. (FOR ICD-10 E10.9, E11.9)., Disp: 1 each, Rfl: 0   carvedilol (COREG) 25 MG tablet, TAKE 1 TABLET(25 MG) BY MOUTH TWICE DAILY WITH A MEAL, Disp: 180 tablet, Rfl: 0   fluticasone (FLONASE) 50 MCG/ACT nasal spray, Place 2 sprays into both nostrils daily., Disp: , Rfl:    hydrochlorothiazide (HYDRODIURIL) 25 MG tablet, Take 1 tablet (25 mg total) by mouth in the morning., Disp: 90 tablet, Rfl: 0   metFORMIN (GLUCOPHAGE) 500 MG tablet, TAKE 1 TABLET(500 MG) BY MOUTH DAILY WITH BREAKFAST, Disp: 90 tablet, Rfl: 1   olmesartan (BENICAR) 40 MG tablet, Take 1 tablet (40 mg total) by mouth every evening., Disp: 90 tablet, Rfl: 0   rosuvastatin (CRESTOR) 40 MG tablet, TAKE 1 TABLET(40 MG) BY MOUTH AT BEDTIME, Disp: 30 tablet, Rfl: 2  No orders of the defined types were placed in this encounter.   There are no Patient Instructions on  file for this visit.   --Continue cardiac medications as reconciled in final medication list. --Return in about 6 months (around 08/19/2022) for Follow up, Coronary artery calcification, BP. Or sooner if needed. --Continue follow-up with your primary care physician regarding the management of your other chronic comorbid conditions.  Patient's questions and concerns were addressed to her satisfaction. She voices understanding of the instructions provided during this encounter.   This note was created using a voice recognition software as a result there may be grammatical errors inadvertently enclosed that do not reflect the nature of this encounter. Every attempt is made to correct such errors.  Rex Kras, [place], [facility]  Pager: [phone] Office: [phone]

## 2022-03-15 ENCOUNTER — Encounter: Payer: Self-pay | Admitting: Internal Medicine

## 2022-04-04 ENCOUNTER — Ambulatory Visit (INDEPENDENT_AMBULATORY_CARE_PROVIDER_SITE_OTHER): Payer: BC Managed Care – PPO | Admitting: Diagnostic Neuroimaging

## 2022-04-04 ENCOUNTER — Encounter: Payer: Self-pay | Admitting: Diagnostic Neuroimaging

## 2022-04-04 ENCOUNTER — Ambulatory Visit: Payer: BC Managed Care – PPO | Admitting: Diagnostic Neuroimaging

## 2022-04-04 VITALS — BP 209/103 | HR 86 | Ht 64.0 in | Wt 186.0 lb

## 2022-04-04 DIAGNOSIS — G500 Trigeminal neuralgia: Secondary | ICD-10-CM | POA: Diagnosis not present

## 2022-04-04 NOTE — Progress Notes (Signed)
[facility]  PATIENT: [employer] DOB: June 07, 1955  REFERRING CLINICIAN: Glade Lloyd Camelia Eng, PA-C HISTORY FROM: patient  REASON FOR VISIT: follow up   HISTORICAL  CHIEF COMPLAINT:  Chief Complaint  Patient presents with   Trigeminal neuralgia    Rm 7 6 month FU, "I don't take carbamazepine anymore, only have a sharp pain every now and then"    HISTORY OF PRESENT ILLNESS:   UPDATE (04/04/22, VRP): Since last visit, doing well. Pain is now resolved. Took CBZ for 2 months, then pain resolved. Now off CBZ. Doing well.   PRIOR HPI (09/21/21): 66 year old female here for evaluation of left mandibular pain.  Patient has had intermittent shocklike stabbing electrical shooting pain sensation lasting 3 to 5 seconds at a time, occurring every few months.  Symptoms started about 2 years ago.  She went to dentist and oral surgeon.  She had some extractions in the left upper molar region.  Unfortunately this did not help.  She tried some different pain medicines with mild relief.   REVIEW OF SYSTEMS: Full 14 system review of systems performed and negative with exception of: as per HPI.  ALLERGIES: No Known Allergies  HOME MEDICATIONS: Outpatient Medications Prior to Visit  Medication Sig Dispense Refill   aspirin EC 81 MG tablet Take 1 tablet (81 mg total) by mouth daily. 90 tablet 3   blood glucose meter kit and supplies Dispense based on patient and insurance preference. Use up to twice a daily as directed. (FOR ICD-10 E10.9, E11.9). 1 each 0   carvedilol (COREG) 25 MG tablet TAKE 1 TABLET(25 MG) BY MOUTH TWICE DAILY WITH A MEAL 180 tablet 0   fluticasone (FLONASE) 50 MCG/ACT nasal spray Place 2 sprays into both nostrils daily.     hydrochlorothiazide (HYDRODIURIL) 25 MG tablet Take 1 tablet (25 mg total) by mouth in the morning. 90 tablet 0   metFORMIN (GLUCOPHAGE) 500 MG tablet TAKE 1 TABLET(500 MG) BY MOUTH DAILY WITH BREAKFAST 90 tablet 1   olmesartan (BENICAR) 40  MG tablet Take 1 tablet (40 mg total) by mouth every evening. 90 tablet 0   rosuvastatin (CRESTOR) 40 MG tablet TAKE 1 TABLET(40 MG) BY MOUTH AT BEDTIME 30 tablet 2   No facility-administered medications prior to visit.    PAST MEDICAL HISTORY: Past Medical History:  Diagnosis Date   Allergy    Arthritis    Coronary artery calcification    Diabetes mellitus without complication ([place])    Facial pain    Hyperlipidemia    Hypertension    Vitamin D deficiency     PAST SURGICAL HISTORY: Past Surgical History:  Procedure Laterality Date   ABDOMINAL HYSTERECTOMY     pt reports full   COLONOSCOPY  2016   Dr. Lucio Edward   COLONOSCOPY  02/18/2020   POLYPECTOMY     TOOTH EXTRACTION  2022   2 teeth pulled   TOTAL ABDOMINAL HYSTERECTOMY     per pt    FAMILY HISTORY: Family History  Problem Relation Age of Onset   Hypertension Sister    Diabetes Sister    Hypertension Mother    Other Father        "violent death"   Cancer Son        lung   Hypertension Sister    Hypertension Sister    Hypertension Sister    Hypertension Sister    Colon cancer Neg Hx    Heart disease Neg Hx    Stroke  Neg Hx    Colon polyps Neg Hx    Rectal cancer Neg Hx    Stomach cancer Neg Hx     SOCIAL HISTORY: Social History   Socioeconomic History   Marital status: Married    Spouse name: Not on file   Number of children: 2   Years of education: Not on file   Highest education level: Not on file  Occupational History   Not on file  Tobacco Use   Smoking status: Never   Smokeless tobacco: Never  Vaping Use   Vaping Use: Never used  Substance and Sexual Activity   Alcohol use: No    Alcohol/week: 0.0 standard drinks of alcohol   Drug use: No   Sexual activity: Not on file  Other Topics Concern   Not on file  Social History Narrative   Lives with husband, has 2 sons, no grandchildren.   Works at [employer].   01/2020   Social Determinants of Health   Financial Resource Strain:  Not on file  Food Insecurity: Not on file  Transportation Needs: Not on file  Physical Activity: Not on file  Stress: Not on file  Social Connections: Not on file  Intimate Partner Violence: Not on file     PHYSICAL EXAM  GENERAL EXAM/CONSTITUTIONAL: Vitals:  Vitals:   04/04/22 1501  BP: (!) 209/103  Pulse: 86  Weight: 186 lb (84.4 kg)  Height: _0  (1.626 m)   Body mass index is 31.93 kg/m. Wt Readings from Last 3 Encounters:  04/04/22 186 lb (84.4 kg)  02/18/22 184 lb (83.5 kg)  01/31/22 185 lb (83.9 kg)   Patient is in no distress; well developed, nourished and groomed; neck is supple  CARDIOVASCULAR: Examination of carotid arteries is normal; no carotid bruits Regular rate and rhythm, no murmurs Examination of peripheral vascular system by observation and palpation is normal  EYES: Ophthalmoscopic exam of optic discs and posterior segments is normal; no papilledema or hemorrhages No results found.  MUSCULOSKELETAL: Gait, strength, tone, movements noted in Neurologic exam below  NEUROLOGIC: MENTAL STATUS:      No data to display         awake, alert, oriented to person, place and time recent and remote memory intact normal attention and concentration language fluent, comprehension intact, naming intact fund of knowledge appropriate  CRANIAL NERVE:  2nd - no papilledema on fundoscopic exam 2nd, 3rd, 4th, 6th - pupils equal and reactive to light, visual fields full to confrontation, extraocular muscles intact, no nystagmus 5th - facial sensation symmetric 7th - facial strength symmetric 8th - hearing intact 9th - palate elevates symmetrically, uvula midline 11th - shoulder shrug symmetric 12th - tongue protrusion midline  MOTOR:  normal bulk and tone, full strength in the BUE, BLE  SENSORY:  normal and symmetric to light touch, temperature, vibration  COORDINATION:  finger-nose-finger, fine finger movements normal  REFLEXES:  deep tendon  reflexes present and symmetric  GAIT/STATION:  narrow based gait    DIAGNOSTIC DATA (LABS, IMAGING, TESTING) - I reviewed patient records, labs, notes, testing and imaging myself where available.  Lab Results  Component Value Date   WBC 7.4 08/30/2021   HGB 13.9 08/30/2021   HCT 41.9 08/30/2021   MCV 88 08/30/2021   PLT 255 08/30/2021      Component Value Date/Time   NA 140 01/26/2022 1516   K 3.7 01/26/2022 1516   CL 101 01/26/2022 1516   CO2 25 01/26/2022 1516  GLUCOSE 145 (H) 01/26/2022 1516   GLUCOSE 120 (H) 10/11/2020 1022   BUN 14 01/26/2022 1516   CREATININE 0.82 01/26/2022 1516   CALCIUM 9.9 01/26/2022 1516   PROT 6.5 01/26/2022 1516   ALBUMIN 4.2 01/26/2022 1516   AST 13 01/26/2022 1516   ALT 17 01/26/2022 1516   ALKPHOS 89 01/26/2022 1516   BILITOT <0.2 01/26/2022 1516   GFRNONAA >60 10/11/2020 1022   GFRAA 88 07/23/2020 1559   Lab Results  Component Value Date   CHOL 242 (H) 01/26/2022   HDL 48 01/26/2022   LDLCALC 163 (H) 01/26/2022   LDLDIRECT 73 01/28/2021   TRIG 172 (H) 01/26/2022   CHOLHDL 5.0 (H) 01/26/2022   Lab Results  Component Value Date   HGBA1C 6.7 (H) 01/26/2022   No results found for: "VITAMINB12" Lab Results  Component Value Date   TSH 0.499 09/11/2020    10/12/21 MRI brain (without) demonstrating: - Stable, moderate periventricular and subcortical foci of nonspecific T2 hyperintensities, likely chronic small vessel ischemic disease.  - No acute findings.   ASSESSMENT AND PLAN  66 y.o. 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    Dx:  1. Left-sided trigeminal neuralgia      PLAN:  LEFT TRIGEMINAL NEURALGIA (left V1, V2) - now resolved; monitor  - may restart CBZ 189m twice a day in future if pain returns  HYPERTENSION - forgot to take meds today; will restart and recheck  at home  Return for pending if symptoms worsen or fail to improve, return to PCP.    VPenni Bombard MD 110/27/2536 36:44PM Certified in Neurology, Neurophysiology and Neuroimaging  [facility] [address] [place] [phone]

## 2022-05-02 ENCOUNTER — Ambulatory Visit (INDEPENDENT_AMBULATORY_CARE_PROVIDER_SITE_OTHER): Payer: BC Managed Care – PPO | Admitting: Medical

## 2022-05-02 ENCOUNTER — Encounter: Payer: Self-pay | Admitting: Medical

## 2022-05-02 VITALS — BP 134/82 | HR 72 | Temp 97.1°F | Ht 64.0 in | Wt 182.4 lb

## 2022-05-02 DIAGNOSIS — H9202 Otalgia, left ear: Secondary | ICD-10-CM

## 2022-05-02 DIAGNOSIS — U071 COVID-19: Secondary | ICD-10-CM

## 2022-05-02 DIAGNOSIS — R059 Cough, unspecified: Secondary | ICD-10-CM

## 2022-05-02 LAB — POC COVID19 BINAXNOW: SARS Coronavirus 2 Ag: POSITIVE — AB

## 2022-05-02 MED ORDER — EMERGEN-C IMMUNE PLUS PO PACK: 1.0000 | PACK | Freq: Two times a day (BID) | ORAL | 0 refills | Status: DC

## 2022-05-02 MED ORDER — MOLNUPIRAVIR EUA 200MG CAPSULE: 4.0000 | ORAL_CAPSULE | Freq: Two times a day (BID) | ORAL | 0 refills | Status: AC

## 2022-05-02 MED ORDER — HYDROCODONE BIT-HOMATROP MBR 5-1.5 MG/5ML PO SOLN: 5.0000 mL | Freq: Three times a day (TID) | ORAL | 0 refills | Status: AC | PRN

## 2022-05-02 NOTE — Progress Notes (Signed)
Subjective:  Penny Rice is a 66 y.o. female who presents for Chief Complaint  Patient presents with   Ear Pain    Left ear pain and coughing since that started Thursday. No home covid tests.      Here for illness. Started feeling bad 4 days ago.   Doesn't feel terrible but having symptoms.  Having cough, left ear pain, some head congestion.  Had sore throat, but better with salt water gargles.  Maybe had low grade fever.   Has some chest discomfort with cough but no other body aches .   Using mucinex.   No severe headaches, no SOB.   No other aggravating or relieving factors.    No other c/o.  Past Medical History:  Diagnosis Date   Allergy    Arthritis    Coronary artery calcification    Diabetes mellitus without complication (HCC)    Facial pain    Hyperlipidemia    Hypertension    Vitamin D deficiency    Current Outpatient Medications on File Prior to Visit  Medication Sig Dispense Refill   aspirin EC 81 MG tablet Take 1 tablet (81 mg total) by mouth daily. 90 tablet 3   blood glucose meter kit and supplies Dispense based on patient and insurance preference. Use up to twice a daily as directed. (FOR ICD-10 E10.9, E11.9). 1 each 0   carvedilol (COREG) 25 MG tablet TAKE 1 TABLET(25 MG) BY MOUTH TWICE DAILY WITH A MEAL 180 tablet 0   fluticasone (FLONASE) 50 MCG/ACT nasal spray Place 2 sprays into both nostrils daily.     metFORMIN (GLUCOPHAGE) 500 MG tablet TAKE 1 TABLET(500 MG) BY MOUTH DAILY WITH BREAKFAST 90 tablet 1   olmesartan (BENICAR) 40 MG tablet Take 1 tablet (40 mg total) by mouth every evening. 90 tablet 0   Phenylephrine-DM-GG-APAP (MUCINEX FAST-MAX COLD FLU) 5-10-200-325 MG/10ML LIQD Take 20 mLs by mouth as needed.     rosuvastatin (CRESTOR) 40 MG tablet TAKE 1 TABLET(40 MG) BY MOUTH AT BEDTIME 30 tablet 2   [DISCONTINUED] potassium chloride (KLOR-CON) 10 MEQ tablet Take 1 tablet (10 mEq total) by mouth daily for 30 doses. 30 tablet 0   No current  facility-administered medications on file prior to visit.   The following portions of the patient's history were reviewed and updated as appropriate: allergies, current medications, past family history, past medical history, past social history, past surgical history and problem list.  ROS Otherwise as in subjective above   Objective: BP 134/82   Pulse 72   Temp (!) 97.1 F (36.2 C) (Tympanic)   Ht _0  (1.626 m)   Wt 182 lb 6.4 oz (82.7 kg)   BMI 31.31 kg/m   Wt Readings from Last 3 Encounters:  05/02/22 182 lb 6.4 oz (82.7 kg)  04/04/22 186 lb (84.4 kg)  02/18/22 184 lb (83.5 kg)    General appearance: alert, no distress, well developed, well nourished, somewhat ill appearing HEENT: normocephalic, sclerae anicteric, conjunctiva pink and moist, TMs flat, nares patent, no discharge or erythema, pharynx normal Oral cavity: MMM, no lesions Neck: supple, no lymphadenopathy, no thyromegaly, no masses Heart: RRR, normal S1, S2, no murmurs Lungs: CTA bilaterally, no wheezes, rhonchi, or rales Pulses: 2+ radial pulses, 2+ pedal pulses, normal cap refill Ext: no edema   Assessment: Encounter Diagnoses  Name Primary?   Cough, unspecified type Yes   Left ear pain    COVID      Plan: + covid swab  General recommendations: I recommend you rest, hydrate well with water and clear fluids throughout the day.   You can use Tylenol for pain or fever You can use over the counter Mucinex DM for cough, or you can use the stronger cough syrup I sent to pharmacy today.  Caution as this can cause sedation. You can use over the counter Emetrol for nausea.    Begin Molnupiravir antiviral. Begin EmergenC vitamin pack.  If you are having trouble breathing, if you are very weak, have high fever 103 or higher consistently despite Tylenol, or uncontrollable nausea and vomiting, then call or go to the emergency department.    If you have other questions or have other symptoms or questions  you are concerned about then please make a virtual visit  Covid symptoms such as fatigue and cough can linger over 2 weeks, even after the initial fever, aches, chills, and other initial symptoms.   Self Quarantine: The CDC, Centers for Disease Control has recommended a self quarantine of 5 days from the start of your illness until you are symptom-free including at least 24 hours of no symptoms including no fever, no shortness of breath, and no body aches and chills, by day 5 before returning to work or general contact with the public.  What does self quarantine mean: avoiding contact with people as much as possible.   Particularly in your house, isolate your self from others in a separate room, wear a mask when possible in the room, particularly if coughing a lot.   Have others bring food, water, medications, etc., to your door, but avoid direct contact with your household contacts during this time to avoid spreading the infection to them.   If you have a separate bathroom and living quarters during the next 2 weeks away from others, that would be preferable.    If you can't completely isolate, then wear a mask, wash hands frequently with soap and water for at least 15 seconds, minimize close contact with others, and have a friend or family member check regularly from a distance to make sure you are not getting seriously worse.     You should not be going out in public, should not be going to stores, to work or other public places until all your symptoms have resolved and at least 5 days + 24 hours of no symptoms at all have transpired.   Ideally you should avoid contact with others for a full 5 days if possible.  One of the goals is to limit spread to high risk people; people that are older and elderly, people with multiple health issues like diabetes, heart disease, lung disease, and anybody that has weakened immune systems such as people with cancer or on immunosuppressive therapy.       Penny Rice was  seen today for ear pain.  Diagnoses and all orders for this visit:  Cough, unspecified type -     POC COVID-19  Left ear pain -     POC COVID-19  COVID  Other orders -     molnupiravir EUA (LAGEVRIO) 200 mg CAPS capsule; Take 4 capsules (800 mg total) by mouth 2 (two) times daily for 5 days. -     Multiple Vitamins-Minerals (EMERGEN-C IMMUNE PLUS) PACK; Take 1 tablet by mouth 2 (two) times daily. -     HYDROcodone bit-homatropine (HYCODAN) 5-1.5 MG/5ML syrup; Take 5 mLs by mouth every 8 (eight) hours as needed for up to 5 days for cough.   Follow  up: prn

## 2022-05-02 NOTE — Patient Instructions (Signed)
+   covid swab  General recommendations: I recommend you rest, hydrate well with water and clear fluids throughout the day.   You can use Tylenol for pain or fever You can use over the counter Mucinex DM for cough, or you can use the stronger cough syrup I sent to pharmacy today.  Caution as this can cause sedation. You can use over the counter Emetrol for nausea.    Begin Molnupiravir antiviral. Begin EmergenC vitamin pack.  If you are having trouble breathing, if you are very weak, have high fever 103 or higher consistently despite Tylenol, or uncontrollable nausea and vomiting, then call or go to the emergency department.    If you have other questions or have other symptoms or questions you are concerned about then please make a virtual visit  Covid symptoms such as fatigue and cough can linger over 2 weeks, even after the initial fever, aches, chills, and other initial symptoms.   Self Quarantine: The CDC, Centers for Disease Control has recommended a self quarantine of 5 days from the start of your illness until you are symptom-free including at least 24 hours of no symptoms including no fever, no shortness of breath, and no body aches and chills, by day 5 before returning to work or general contact with the public.  What does self quarantine mean: avoiding contact with people as much as possible.   Particularly in your house, isolate your self from others in a separate room, wear a mask when possible in the room, particularly if coughing a lot.   Have others bring food, water, medications, etc., to your door, but avoid direct contact with your household contacts during this time to avoid spreading the infection to them.   If you have a separate bathroom and living quarters during the next 2 weeks away from others, that would be preferable.    If you can't completely isolate, then wear a mask, wash hands frequently with soap and water for at least 15 seconds, minimize close contact with  others, and have a friend or family member check regularly from a distance to make sure you are not getting seriously worse.     You should not be going out in public, should not be going to stores, to work or other public places until all your symptoms have resolved and at least 5 days + 24 hours of no symptoms at all have transpired.   Ideally you should avoid contact with others for a full 5 days if possible.  One of the goals is to limit spread to high risk people; people that are older and elderly, people with multiple health issues like diabetes, heart disease, lung disease, and anybody that has weakened immune systems such as people with cancer or on immunosuppressive therapy.

## 2022-06-20 LAB — HM DIABETES EYE EXAM

## 2022-06-23 ENCOUNTER — Telehealth: Payer: Self-pay | Admitting: Medical

## 2022-06-23 NOTE — Telephone Encounter (Signed)
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

## 2022-08-19 ENCOUNTER — Ambulatory Visit: Payer: BC Managed Care – PPO | Admitting: Cardiology

## 2022-09-06 ENCOUNTER — Ambulatory Visit: Payer: BC Managed Care – PPO | Admitting: Cardiology

## 2022-09-06 ENCOUNTER — Other Ambulatory Visit: Payer: Self-pay | Admitting: Cardiology

## 2022-09-06 ENCOUNTER — Encounter: Payer: Self-pay | Admitting: Cardiology

## 2022-09-06 VITALS — BP 170/110 | HR 82 | Resp 17 | Ht 64.0 in | Wt 185.4 lb

## 2022-09-06 DIAGNOSIS — E1165 Type 2 diabetes mellitus with hyperglycemia: Secondary | ICD-10-CM

## 2022-09-06 DIAGNOSIS — E782 Mixed hyperlipidemia: Secondary | ICD-10-CM

## 2022-09-06 DIAGNOSIS — Z6831 Body mass index (BMI) 31.0-31.9, adult: Secondary | ICD-10-CM

## 2022-09-06 DIAGNOSIS — I10 Essential (primary) hypertension: Secondary | ICD-10-CM

## 2022-09-06 DIAGNOSIS — I2510 Atherosclerotic heart disease of native coronary artery without angina pectoris: Secondary | ICD-10-CM | POA: Diagnosis not present

## 2022-09-06 MED ORDER — AMLODIPINE BESYLATE 10 MG PO TABS: 10.0000 mg | ORAL_TABLET | Freq: Every morning | ORAL | 0 refills | Status: DC

## 2022-09-06 NOTE — Progress Notes (Signed)
ID:  Penny Rice, DOB 1955-07-25, MRN [id number]  PCP:  Carlena Hurl, PA-C  Cardiologist:  Rex Kras, DO, [facility] (established care 07/16/2020)  Date: 09/06/22 Last Office Visit: 02/18/2022  Chief Complaint  Patient presents with   Follow-up    6 months Coronary calcification, nonobstructive CAD, and hypertension   HPI  Penny Rice is a 67 y.o. female whose past medical history and cardiovascular risk factors include: Severe coronary artery calcification, atherosclerosis of the coronary arteries per coronary CTA, HTN, HLD, NIDDM, postmenopausal female, obesity due to excess calories.  Referred to the practice for evaluation of chest pain and hypertension.  Patient underwent appropriate ischemic workup as outlined below which included coronary CTA which noted nonobstructive CAD and CT FFR negative for hemodynamically significant stenosis.  She was started on medical therapy and now presents for 23-month follow-up visit.  In the past she requested assistance with blood pressure management as well.  In the past, she will take all her BP medications the morning and her evening blood pressure readings were low and she would be symptomatically dizzy.  Medications were changed and some more advised to be taken the morning and others in the evening.  Thereafter the blood pressure is well-controlled.  However, over the last 6 months she denies anginal chest pain or heart failure symptoms.  However, her blood pressures today are not well-controlled.  She does not check her blood pressures at home.  Upon further questioning patient states that she is noncompliant with her medications on a daily basis.  Patient attributes elevated blood pressures likely secondary to medication noncompliance and increased stress at work.  She denies any pain between her shoulder blades, no history of aortic syndromes, no change in overall physical endurance/capacity, no syncope or near syncope, no headaches or visual  changes or focal neurological deficits.  FUNCTIONAL STATUS: Works at [employer] and does heavy lifting. No structured exercise program or daily routine.   ALLERGIES: No Known Allergies  MEDICATION LIST PRIOR TO VISIT: Current Meds  Medication Sig   amLODipine (NORVASC) 10 MG tablet Take 1 tablet (10 mg total) by mouth every morning.   aspirin EC 81 MG tablet Take 1 tablet (81 mg total) by mouth daily.   blood glucose meter kit and supplies Dispense based on patient and insurance preference. Use up to twice a daily as directed. (FOR ICD-10 E10.9, E11.9).   metFORMIN (GLUCOPHAGE) 500 MG tablet TAKE 1 TABLET(500 MG) BY MOUTH DAILY WITH BREAKFAST   Multiple Vitamins-Minerals (EMERGEN-C IMMUNE PLUS) PACK Take 1 tablet by mouth 2 (two) times daily.     PAST MEDICAL HISTORY: Past Medical History:  Diagnosis Date   Allergy    Arthritis    Coronary artery calcification    Diabetes mellitus without complication    Facial pain    Hyperlipidemia    Hypertension    Vitamin D deficiency     PAST SURGICAL HISTORY: Past Surgical History:  Procedure Laterality Date   ABDOMINAL HYSTERECTOMY     pt reports full   COLONOSCOPY  2016   Dr. Lucio Edward   COLONOSCOPY  02/18/2020   POLYPECTOMY     TOOTH EXTRACTION  2022   2 teeth pulled   TOTAL ABDOMINAL HYSTERECTOMY     per pt    FAMILY HISTORY: The patient family history includes Cancer in her son; Diabetes in her sister; Hypertension in her mother, sister, sister, sister, sister, and sister; Other in her father.  SOCIAL HISTORY:  The  patient  reports that she has never smoked. She has never used smokeless tobacco. She reports that she does not drink alcohol and does not use drugs.  REVIEW OF SYSTEMS: Review of Systems  Cardiovascular:  Negative for chest pain, claudication, dyspnea on exertion, irregular heartbeat, leg swelling, near-syncope, orthopnea, palpitations, paroxysmal nocturnal dyspnea and syncope.  Respiratory:  Negative for  shortness of breath.   Hematologic/Lymphatic: Negative for bleeding problem.  Musculoskeletal:  Negative for muscle cramps and myalgias.  Neurological:  Negative for dizziness and light-headedness.   PHYSICAL EXAM:    09/06/2022    4:25 PM 09/06/2022    3:57 PM 09/06/2022    3:52 PM  Vitals with BMI  Systolic [id number]  Diastolic [id number]  Pulse 82  76    Physical Exam  Constitutional: No distress.  Age appropriate, hemodynamically stable.   Neck: No JVD present.  Cardiovascular: Normal rate, regular rhythm, S1 normal, S2 normal, intact distal pulses and normal pulses. Exam reveals no gallop, no S3 and no S4.  No murmur heard. Pulmonary/Chest: Effort normal and breath sounds normal. No stridor. She has no wheezes. She has no rales.  Abdominal: Soft. Bowel sounds are normal. She exhibits no distension. There is no abdominal tenderness.  Musculoskeletal:        General: No edema.     Cervical back: Neck supple.  Neurological: She is alert and oriented to person, place, and time. She has intact cranial nerves (2-12).  Skin: Skin is warm and moist.   CARDIAC DATABASE: EKG:  September 06, 2022: Sinus rhythm, 72 bpm, nonspecific T wave abnormality.  No significant change compared to 01/31/2022  Echocardiogram: 07/30/2020:  Left ventricle cavity is normal in size. Mild concentric hypertrophy of the left ventricle. Normal global wall motion. Normal LV systolic function with EF 55%. Doppler evidence of grade I diastolic dysfunction, normal LAP.  Left atrial cavity is mildly dilated. Mild (Grade I) mitral regurgitation. Mild tricuspid regurgitation.  No evidence of pulmonary hypertension.  Stress Testing: No results found for this or any previous visit from the past 1095 days.  Heart Catheterization: None  Renal artery duplex  07/30/2020:  No evidence of renal artery occlusive disease in either renal artery. Normal intrarenal vascular perfusion is noted in both kidneys. Renal length  is within normal limits for both kidneys. Normal abdominal aorta flow velocities noted. No significant atherosclerotic plaque noted in the abdominal aorta.   Coronary CTA 08/11/2020:  1. Total coronary calcium score of 1404. This was 99th percentile for age and sex matched control. 2. Normal coronary origin with right dominance. 3. CAD-RADS 4. Approximately 70 percent stenosis in the proximal LAD due to mixed plaque. Ramus intermedius overall patent with diffuse calcified and noncalcified plaque. Moderate stenosis (50-69%) at the ostial CX due to calcified plaque. Diffuse calcified and noncalcified plaque within the proximal to mid RCA. Mild stenosis (25-49%) in distal RCA due to mixed plaque. 4. Aortic atherosclerosis within the ascending and the descending thoracic aorta. CT FFR analysis showed no significant stenosis in the proximal LAD or LCx distribution.  LABORATORY DATA:    Latest Ref Rng & Units 08/30/2021   12:05 PM 10/11/2020   10:22 AM 09/11/2020    6:57 PM  CBC  WBC 3.4 - 10.8 x10E3/uL 7.4  7.7  8.9   Hemoglobin 11.1 - 15.9 g/dL 13.9  13.9  12.9   Hematocrit 34.0 - 46.6 % 41.9  42.0  39.2   Platelets 150 - 450 x10E3/uL  255  244  253        Latest Ref Rng & Units 01/26/2022    3:16 PM 08/30/2021   12:05 PM 03/29/2021    4:31 PM  CMP  Glucose 70 - 99 mg/dL 145  182  152   BUN 8 - 27 mg/dL 14  19  23    Creatinine 0.57 - 1.00 mg/dL 0.82  0.91  1.37   Sodium 134 - 144 mmol/L 140  141  142   Potassium 3.5 - 5.2 mmol/L 3.7  3.5  3.6   Chloride 96 - 106 mmol/L 101  103  100   CO2 20 - 29 mmol/L 25  26  27    Calcium 8.7 - 10.3 mg/dL 9.9  10.5  10.2   Total Protein 6.0 - 8.5 g/dL 6.5   7.2   Total Bilirubin 0.0 - 1.2 mg/dL <0.2   0.2   Alkaline Phos 44 - 121 IU/L 89   82   AST 0 - 40 IU/L 13   17   ALT 0 - 32 IU/L 17   17     Lipid Panel     Component Value Date/Time   CHOL 242 (H) 01/26/2022 1516   TRIG 172 (H) 01/26/2022 1516   HDL 48 01/26/2022 1516   CHOLHDL 5.0 (H)  01/26/2022 1516   LDLCALC 163 (H) 01/26/2022 1516   LDLDIRECT 73 01/28/2021 0939   LABVLDL 31 01/26/2022 1516    No components found for: "NTPROBNP" No results for input(s): "PROBNP" in the last 8760 hours. No results for input(s): "TSH" in the last 8760 hours.   BMP Recent Labs    01/26/22 1516  NA 140  K 3.7  CL 101  CO2 25  GLUCOSE 145*  BUN 14  CREATININE 0.82  CALCIUM 9.9    HEMOGLOBIN A1C Lab Results  Component Value Date   HGBA1C 6.7 (H) 01/26/2022    IMPRESSION:    ICD-10-CM   1. Benign hypertension  I10     2. Coronary atherosclerosis due to calcified coronary lesion of native artery  I25.10 EKG 12-Lead   I25.84     3. Mixed hyperlipidemia  E78.2     4. Type 2 diabetes mellitus with hyperglycemia, without long-term current use of insulin  E11.65     5. Class 1 obesity due to excess calories with serious comorbidity and body mass index (BMI) of 31.0 to 31.9 in adult  E66.09    Z68.31        RECOMMENDATIONS: Penny Rice is a 67 y.o. female whose past medical history and cardiac risk factors include: Severe coronary artery calcification, atherosclerosis of the coronary arteries per coronary CTA, HTN, HLD, NIDDM, postmenopausal female, obesity due to excess calories.  Benign hypertension Uncontrolled. Not compliant with medical therapy and increase stress at work. No symptoms to suggest aortic syndrome. Denies any vision changes, focal neurological deficits. Patient is educated at today's office visit that uncontrolled hypertension can lead to comorbid conditions such as strokes, hypertensive heart disease including cardiomyopathy and heart failure, and worsening renal function leading to dialysis. Patient is also unsure of her current medication list. Unable to reconcile her medications carefully. I will put her back on amlodipine 10 mg p.o. every morning (she was on it before) to take if her SBP at home is consistently greater than 140 mmHg. I like  to see her back in 2 weeks to reevaluate her blood pressure log and to see if medication titration is  warranted. In the interim if she has symptoms suggestive of aortic syndrome, stroke, or shortness of breath patient is asked to go to the closest ER via EMS for further evaluation and management  Atherosclerosis of native coronary artery of native heart without angina pectoris /severe CAC Total CAC 1404. CAD RADS 4, CT FFR negative for hemodynamically significant stenosis. Continue aspirin and statin therapy. Lipids as of August 2023 are also not well-controlled. Patient states that she has not been compliant with her statin medications. I have asked her to restart her pharmacological therapy and when she has been on it for 6 weeks she is advised to have it rechecked with either myself or PCP to see if additional medication changes will be warranted.  She verbalizes understanding. Denies angina pectoris.  Mixed hyperlipidemia Currently on rosuvastatin.   As discussed above.  Type 2 diabetes mellitus with hyperglycemia, without long-term current use of insulin (Alpha) Reemphasized the importance of glycemic control. Currently managed by primary care provider. Continue ARB, statin therapy May consider [name] if clinically indicated.  Will defer to PCP   FINAL MEDICATION LIST END OF ENCOUNTER: Meds ordered this encounter  Medications   amLODipine (NORVASC) 10 MG tablet    Sig: Take 1 tablet (10 mg total) by mouth every morning.    Dispense:  30 tablet    Refill:  0      Current Outpatient Medications:    amLODipine (NORVASC) 10 MG tablet, Take 1 tablet (10 mg total) by mouth every morning., Disp: 30 tablet, Rfl: 0   aspirin EC 81 MG tablet, Take 1 tablet (81 mg total) by mouth daily., Disp: 90 tablet, Rfl: 3   blood glucose meter kit and supplies, Dispense based on patient and insurance preference. Use up to twice a daily as directed. (FOR ICD-10 E10.9, E11.9)., Disp: 1 each, Rfl:  0   metFORMIN (GLUCOPHAGE) 500 MG tablet, TAKE 1 TABLET(500 MG) BY MOUTH DAILY WITH BREAKFAST, Disp: 90 tablet, Rfl: 1   Multiple Vitamins-Minerals (EMERGEN-C IMMUNE PLUS) PACK, Take 1 tablet by mouth 2 (two) times daily., Disp: 10 each, Rfl: 0   carvedilol (COREG) 25 MG tablet, TAKE 1 TABLET(25 MG) BY MOUTH TWICE DAILY WITH A MEAL, Disp: 180 tablet, Rfl: 0   fluticasone (FLONASE) 50 MCG/ACT nasal spray, Place 2 sprays into both nostrils daily., Disp: , Rfl:    olmesartan (BENICAR) 40 MG tablet, Take 1 tablet (40 mg total) by mouth every evening., Disp: 90 tablet, Rfl: 0   Phenylephrine-DM-GG-APAP (MUCINEX FAST-MAX COLD FLU) 5-10-200-325 MG/10ML LIQD, Take 20 mLs by mouth as needed. (Patient not taking: Reported on 09/06/2022), Disp: , Rfl:    rosuvastatin (CRESTOR) 40 MG tablet, TAKE 1 TABLET(40 MG) BY MOUTH AT BEDTIME, Disp: 30 tablet, Rfl: 2  Orders Placed This Encounter  Procedures   EKG 12-Lead     There are no Patient Instructions on file for this visit.   --Continue cardiac medications as reconciled in final medication list. --Return in about 2 weeks (around 09/20/2022) for Follow up, BP. Or sooner if needed. --Continue follow-up with your primary care physician regarding the management of your other chronic comorbid conditions.  Patient's questions and concerns were addressed to her satisfaction. She voices understanding of the instructions provided during this encounter.   This note was created using a voice recognition software as a result there may be grammatical errors inadvertently enclosed that do not reflect the nature of this encounter. Every attempt is made to correct such errors.  Letica Giaimo [place],  DO, [facility]  Pager:  [phone] Office: [phone]

## 2022-09-07 ENCOUNTER — Other Ambulatory Visit: Payer: Self-pay | Admitting: Medical

## 2022-09-07 DIAGNOSIS — E1165 Type 2 diabetes mellitus with hyperglycemia: Secondary | ICD-10-CM

## 2022-09-07 DIAGNOSIS — I2510 Atherosclerotic heart disease of native coronary artery without angina pectoris: Secondary | ICD-10-CM

## 2022-09-07 DIAGNOSIS — E782 Mixed hyperlipidemia: Secondary | ICD-10-CM

## 2022-09-20 ENCOUNTER — Ambulatory Visit: Payer: BC Managed Care – PPO | Admitting: Cardiology

## 2022-09-22 ENCOUNTER — Encounter: Payer: Self-pay | Admitting: Cardiology

## 2022-09-22 ENCOUNTER — Ambulatory Visit: Payer: BC Managed Care – PPO | Admitting: Cardiology

## 2022-09-22 VITALS — BP 206/120 | HR 74 | Resp 16 | Ht 64.0 in | Wt 186.0 lb

## 2022-09-22 DIAGNOSIS — E782 Mixed hyperlipidemia: Secondary | ICD-10-CM

## 2022-09-22 DIAGNOSIS — I10 Essential (primary) hypertension: Secondary | ICD-10-CM | POA: Diagnosis not present

## 2022-09-22 DIAGNOSIS — E1165 Type 2 diabetes mellitus with hyperglycemia: Secondary | ICD-10-CM

## 2022-09-22 DIAGNOSIS — I2510 Atherosclerotic heart disease of native coronary artery without angina pectoris: Secondary | ICD-10-CM

## 2022-09-22 DIAGNOSIS — E6609 Other obesity due to excess calories: Secondary | ICD-10-CM

## 2022-09-22 NOTE — Progress Notes (Signed)
ID:  ANJULI GEMMILL, DOB 06-12-1955, MRN [id number]  PCP:  Jac Canavan, PA-C  Cardiologist:  Tessa Lerner, DO, [facility] (established care 07/16/2020)  Date: 09/22/22 Last Office Visit: 09/06/2022  Chief Complaint  Patient presents with   Hypertension   Follow-up    2 weeks   HPI  Penny Rice is a 67 y.o. female whose past medical history and cardiovascular risk factors include: Severe coronary artery calcification, atherosclerosis of the coronary arteries per coronary CTA, HTN, HLD, NIDDM, postmenopausal female, obesity due to excess calories.  Patient was referred to the practice for evaluation of chest pain and hypertension.  She is undergone appropriate ischemic workup as outlined below.  However, at the last office visit when she came in for 29-month visit her blood pressures were very uncontrolled.  She was attributing her uncontrolled blood pressures to being noncompliant with medical therapy on a regular basis.  She is also attributing it to increased stress at work.  I encouraged her the importance of medication compliance as uncontrolled hypertension can lead to multiple comorbid conditions such as stroke, disease, progression of chronic kidney disease leading to dialysis, etc.  Patient has been taking her blood pressure medications as prescribed and comes in today for follow-up visit.  The blood pressure log that she brings in is quite scattered but home blood pressures appear to be better controlled than office numbers.  She did not start the amlodipine 10 mg p.o. daily which was given to her at last visit despite some of her home readings being greater than 140 mmHg.  FUNCTIONAL STATUS: Works at [employer] and does heavy lifting. No structured exercise program or daily routine.   ALLERGIES: No Known Allergies  MEDICATION LIST PRIOR TO VISIT: Current Meds  Medication Sig   amLODipine (NORVASC) 10 MG tablet TAKE 1 TABLET(10 MG) BY MOUTH EVERY MORNING   aspirin EC 81 MG tablet  Take 1 tablet (81 mg total) by mouth daily.   blood glucose meter kit and supplies Dispense based on patient and insurance preference. Use up to twice a daily as directed. (FOR ICD-10 E10.9, E11.9).   carvedilol (COREG) 25 MG tablet TAKE 1 TABLET(25 MG) BY MOUTH TWICE DAILY WITH A MEAL   metFORMIN (GLUCOPHAGE) 500 MG tablet TAKE 1 TABLET(500 MG) BY MOUTH DAILY WITH BREAKFAST   olmesartan (BENICAR) 40 MG tablet Take 1 tablet (40 mg total) by mouth every evening.   rosuvastatin (CRESTOR) 40 MG tablet TAKE 1 TABLET(40 MG) BY MOUTH AT BEDTIME     PAST MEDICAL HISTORY: Past Medical History:  Diagnosis Date   Allergy    Arthritis    Coronary artery calcification    Diabetes mellitus without complication    Facial pain    Hyperlipidemia    Hypertension    Vitamin D deficiency     PAST SURGICAL HISTORY: Past Surgical History:  Procedure Laterality Date   ABDOMINAL HYSTERECTOMY     pt reports full   COLONOSCOPY  2016   Dr. Claudette Head   COLONOSCOPY  02/18/2020   POLYPECTOMY     TOOTH EXTRACTION  2022   2 teeth pulled   TOTAL ABDOMINAL HYSTERECTOMY     per pt    FAMILY HISTORY: The patient family history includes Cancer in her son; Diabetes in her sister; Hypertension in her mother, sister, sister, sister, sister, and sister; Other in her father.  SOCIAL HISTORY:  The patient  reports that she has never smoked. She has never used smokeless tobacco.  She reports that she does not drink alcohol and does not use drugs.  REVIEW OF SYSTEMS: Review of Systems  Cardiovascular:  Negative for chest pain, claudication, dyspnea on exertion, irregular heartbeat, leg swelling, near-syncope, orthopnea, palpitations, paroxysmal nocturnal dyspnea and syncope.  Respiratory:  Negative for shortness of breath.   Hematologic/Lymphatic: Negative for bleeding problem.  Musculoskeletal:  Negative for muscle cramps and myalgias.  Neurological:  Negative for dizziness and light-headedness.   PHYSICAL  EXAM:    09/22/2022    3:23 PM 09/22/2022    3:17 PM 09/06/2022    4:25 PM  Vitals with BMI  Height     Weight  186 lbs   BMI  31.91   Systolic 206 211 409  Diastolic 120 127 811  Pulse 74 78 82   Upon recheck systolic blood pressure still greater than 200 mmHg bilaterally  Physical Exam  Constitutional: No distress.  Age appropriate, hemodynamically stable.   Neck: No JVD present.  Cardiovascular: Normal rate, regular rhythm, S1 normal, S2 normal, intact distal pulses and normal pulses. Exam reveals no gallop, no S3 and no S4.  No murmur heard. Pulmonary/Chest: Effort normal and breath sounds normal. No stridor. She has no wheezes. She has no rales.  Abdominal: Soft. Bowel sounds are normal. She exhibits no distension. There is no abdominal tenderness.  Musculoskeletal:        General: No edema.     Cervical back: Neck supple.  Neurological: She is alert and oriented to person, place, and time. She has intact cranial nerves (2-12).  Skin: Skin is warm and moist.   CARDIAC DATABASE: EKG:  September 06, 2022: Sinus rhythm, 72 bpm, nonspecific T wave abnormality.  No significant change compared to 01/31/2022  Echocardiogram: 07/30/2020:  Left ventricle cavity is normal in size. Mild concentric hypertrophy of the left ventricle. Normal global wall motion. Normal LV systolic function with EF 55%. Doppler evidence of grade I diastolic dysfunction, normal LAP.  Left atrial cavity is mildly dilated. Mild (Grade I) mitral regurgitation. Mild tricuspid regurgitation.  No evidence of pulmonary hypertension.  Stress Testing: No results found for this or any previous visit from the past 1095 days.  Heart Catheterization: None  Renal artery duplex  07/30/2020:  No evidence of renal artery occlusive disease in either renal artery. Normal intrarenal vascular perfusion is noted in both kidneys. Renal length is within normal limits for both kidneys. Normal abdominal aorta flow velocities  noted. No significant atherosclerotic plaque noted in the abdominal aorta.   Coronary CTA 08/11/2020:  1. Total coronary calcium score of 1404. This was 99th percentile for age and sex matched control. 2. Normal coronary origin with right dominance. 3. CAD-RADS 4. Approximately 70 percent stenosis in the proximal LAD due to mixed plaque. Ramus intermedius overall patent with diffuse calcified and noncalcified plaque. Moderate stenosis (50-69%) at the ostial CX due to calcified plaque. Diffuse calcified and noncalcified plaque within the proximal to mid RCA. Mild stenosis (25-49%) in distal RCA due to mixed plaque. 4. Aortic atherosclerosis within the ascending and the descending thoracic aorta. CT FFR analysis showed no significant stenosis in the proximal LAD or LCx distribution.  LABORATORY DATA:    Latest Ref Rng & Units 08/30/2021   12:05 PM 10/11/2020   10:22 AM 09/11/2020    6:57 PM  CBC  WBC 3.4 - 10.8 x10E3/uL 7.4  7.7  8.9   Hemoglobin 11.1 - 15.9 g/dL 91.4  78.2  95.6   Hematocrit 34.0 -  46.6 % 41.9  42.0  39.2   Platelets 150 - 450 x10E3/uL 255  244  253        Latest Ref Rng & Units 01/26/2022    3:16 PM 08/30/2021   12:05 PM 03/29/2021    4:31 PM  CMP  Glucose 70 - 99 mg/dL 409  811  914   BUN 8 - 27 mg/dL 14  19  23    Creatinine 0.57 - 1.00 mg/dL 7.82  9.56  2.13   Sodium 134 - 144 mmol/L 140  141  142   Potassium 3.5 - 5.2 mmol/L 3.7  3.5  3.6   Chloride 96 - 106 mmol/L 101  103  100   CO2 20 - 29 mmol/L 25  26  27    Calcium 8.7 - 10.3 mg/dL 9.9  08.6  57.8   Total Protein 6.0 - 8.5 g/dL 6.5   7.2   Total Bilirubin 0.0 - 1.2 mg/dL <4.6   0.2   Alkaline Phos 44 - 121 IU/L 89   82   AST 0 - 40 IU/L 13   17   ALT 0 - 32 IU/L 17   17     Lipid Panel     Component Value Date/Time   CHOL 242 (H) 01/26/2022 1516   TRIG 172 (H) 01/26/2022 1516   HDL 48 01/26/2022 1516   CHOLHDL 5.0 (H) 01/26/2022 1516   LDLCALC 163 (H) 01/26/2022 1516   LDLDIRECT 73 01/28/2021 0939    LABVLDL 31 01/26/2022 1516    No components found for: "NTPROBNP" No results for input(s): "PROBNP" in the last 8760 hours. No results for input(s): "TSH" in the last 8760 hours.   BMP Recent Labs    01/26/22 1516  NA 140  K 3.7  CL 101  CO2 25  GLUCOSE 145*  BUN 14  CREATININE 0.82  CALCIUM 9.9    HEMOGLOBIN A1C Lab Results  Component Value Date   HGBA1C 6.7 (H) 01/26/2022    IMPRESSION:    ICD-10-CM   1. Benign hypertension  I10 PCV RENAL/RENAL ARTERY DUPLEX COMPLETE    2. Coronary atherosclerosis due to calcified coronary lesion of native artery  I25.10    I25.84     3. Mixed hyperlipidemia  E78.2     4. Type 2 diabetes mellitus with hyperglycemia, without long-term current use of insulin  E11.65     5. Atherosclerosis of native coronary artery of native heart without angina pectoris  I25.10     6. Class 1 obesity due to excess calories with serious comorbidity and body mass index (BMI) of 31.0 to 31.9 in adult  E66.09    Z68.31        RECOMMENDATIONS: Penny Rice is a 67 y.o. female whose past medical history and cardiac risk factors include: Severe coronary artery calcification, atherosclerosis of the coronary arteries per coronary CTA, HTN, HLD, NIDDM, postmenopausal female, obesity due to excess calories.  Benign hypertension Uncontrolled. Office blood pressures are higher than the home readings. Home blood pressure log reviewed, not consistent with definitely less than 180 mmHg. Medications reconciled. Asked her to take her ARB at night, amlodipine in the morning, and carvedilol twice daily Will check renal duplex to rule out renal artery stenosis Patient is aware that elevated blood pressures can lead to comorbidities such as strokes and aortic syndromes.  She is asked to be more cognizant of such symptoms and if present to go to the closest ER via EMS  for further evaluation and management. I have asked her to call me back in 5 days to go over her  home blood pressure readings to see if further medication titration is warranted.  I have also asked her to bring her blood pressure cuff at the next visit to verify accuracy.  Atherosclerosis of native coronary artery of native heart without angina pectoris /severe CAC Total CAC 1404. CAD RADS 4, CT FFR negative for hemodynamically significant stenosis. Continue aspirin and statin therapy. Lipids as of August 2023 are also not well-controlled. She would not tolerate statin therapy in the past.  When she has been on statins consistently for 6 weeks recommended that she have it rechecked with PCP or myself.  Mixed hyperlipidemia Currently on rosuvastatin.   As discussed above.  Type 2 diabetes mellitus with hyperglycemia, without long-term current use of insulin (HCC) Reemphasized the importance of glycemic control. Currently managed by primary care provider. Continue ARB, statin therapy May consider [name] if clinically indicated.  Will defer to PCP   FINAL MEDICATION LIST END OF ENCOUNTER: No orders of the defined types were placed in this encounter.     Current Outpatient Medications:    amLODipine (NORVASC) 10 MG tablet, TAKE 1 TABLET(10 MG) BY MOUTH EVERY MORNING, Disp: 90 tablet, Rfl: 1   aspirin EC 81 MG tablet, Take 1 tablet (81 mg total) by mouth daily., Disp: 90 tablet, Rfl: 3   blood glucose meter kit and supplies, Dispense based on patient and insurance preference. Use up to twice a daily as directed. (FOR ICD-10 E10.9, E11.9)., Disp: 1 each, Rfl: 0   carvedilol (COREG) 25 MG tablet, TAKE 1 TABLET(25 MG) BY MOUTH TWICE DAILY WITH A MEAL, Disp: 180 tablet, Rfl: 0   metFORMIN (GLUCOPHAGE) 500 MG tablet, TAKE 1 TABLET(500 MG) BY MOUTH DAILY WITH BREAKFAST, Disp: 90 tablet, Rfl: 1   olmesartan (BENICAR) 40 MG tablet, Take 1 tablet (40 mg total) by mouth every evening., Disp: 90 tablet, Rfl: 0   rosuvastatin (CRESTOR) 40 MG tablet, TAKE 1 TABLET(40 MG) BY MOUTH AT BEDTIME,  Disp: 30 tablet, Rfl: 2   fluticasone (FLONASE) 50 MCG/ACT nasal spray, Place 2 sprays into both nostrils daily. (Patient not taking: Reported on 09/22/2022), Disp: , Rfl:   Orders Placed This Encounter  Procedures   PCV RENAL/RENAL ARTERY DUPLEX COMPLETE    There are no Patient Instructions on file for this visit.   --Continue cardiac medications as reconciled in final medication list. --Return in about 2 weeks (around 10/06/2022) for Follow up, BP. Or sooner if needed. --Continue follow-up with your primary care physician regarding the management of your other chronic comorbid conditions.  Patient's questions and concerns were addressed to her satisfaction. She voices understanding of the instructions provided during this encounter.   This note was created using a voice recognition software as a result there may be grammatical errors inadvertently enclosed that do not reflect the nature of this encounter. Every attempt is made to correct such errors.  Tessa Lerner, [place], [facility]  Pager:  [phone] Office: [phone]

## 2022-10-07 ENCOUNTER — Ambulatory Visit: Payer: BC Managed Care – PPO | Admitting: Cardiology

## 2022-10-07 ENCOUNTER — Encounter: Payer: Self-pay | Admitting: Cardiology

## 2022-10-07 VITALS — BP 147/83 | HR 69 | Resp 17 | Ht 64.0 in | Wt 181.0 lb

## 2022-10-07 DIAGNOSIS — E782 Mixed hyperlipidemia: Secondary | ICD-10-CM | POA: Diagnosis not present

## 2022-10-07 DIAGNOSIS — E1165 Type 2 diabetes mellitus with hyperglycemia: Secondary | ICD-10-CM

## 2022-10-07 DIAGNOSIS — E6609 Other obesity due to excess calories: Secondary | ICD-10-CM | POA: Diagnosis not present

## 2022-10-07 DIAGNOSIS — I2510 Atherosclerotic heart disease of native coronary artery without angina pectoris: Secondary | ICD-10-CM | POA: Diagnosis not present

## 2022-10-07 DIAGNOSIS — I10 Essential (primary) hypertension: Secondary | ICD-10-CM | POA: Diagnosis not present

## 2022-10-07 MED ORDER — EMPAGLIFLOZIN 10 MG PO TABS: 10.0000 mg | ORAL_TABLET | Freq: Every day | ORAL | 0 refills | Status: DC

## 2022-10-07 NOTE — Progress Notes (Signed)
ID:  Penny Rice, DOB [birth year], MRN [id number]  PCP:  Jac Canavan, PA-C  Cardiologist:  Tessa Lerner, DO, [facility] (established care 07/16/2020)  Date: 10/07/22 Last Office Visit: 09/22/2022  Chief Complaint  Patient presents with   Hypertension   Follow-up   HPI  Penny Rice is a 67 y.o. female whose past medical history and cardiovascular risk factors include: Severe coronary artery calcification, atherosclerosis of the coronary arteries per coronary CTA, HTN, HLD, NIDDM, postmenopausal female, obesity due to excess calories.  Patient presents today for 2-week follow-up visit for uncontrolled hypertension.  In the recent past when she came in for 26-month follow-up visit her blood pressures were noted to be significantly uncontrolled likely secondary to being noncompliant with medical therapy on a regular basis.  With educating her on importance of medication compliance and the need for better blood pressure control to minimize the risk of other comorbid conditions such as stroke, chronic kidney disease, cardiomyopathy patient was evaluated on antihypertensive medications.  Home blood pressure log reviewed.  Over the last 2 weeks her blood pressures have been slowly improving and over the last 5 days SBP's have been running between 130-140 mmHg.  She is also lost 5 pounds since last office visit and is coping better with stress.   FUNCTIONAL STATUS: Works at [employer] and does heavy lifting. No structured exercise program or daily routine.   ALLERGIES: No Known Allergies  MEDICATION LIST PRIOR TO VISIT: Current Meds  Medication Sig   amLODipine (NORVASC) 10 MG tablet TAKE 1 TABLET(10 MG) BY MOUTH EVERY MORNING   aspirin EC 81 MG tablet Take 1 tablet (81 mg total) by mouth daily.   blood glucose meter kit and supplies Dispense based on patient and insurance preference. Use up to twice a daily as directed. (FOR ICD-10 E10.9, E11.9).   carvedilol (COREG) 25 MG tablet TAKE 1  TABLET(25 MG) BY MOUTH TWICE DAILY WITH A MEAL   empagliflozin ([name]) 10 MG TABS tablet Take 1 tablet (10 mg total) by mouth daily before breakfast.   fluticasone (FLONASE) 50 MCG/ACT nasal spray Place 2 sprays into both nostrils daily.   metFORMIN (GLUCOPHAGE) 500 MG tablet TAKE 1 TABLET(500 MG) BY MOUTH DAILY WITH BREAKFAST   olmesartan (BENICAR) 40 MG tablet Take 1 tablet (40 mg total) by mouth every evening.   rosuvastatin (CRESTOR) 40 MG tablet TAKE 1 TABLET(40 MG) BY MOUTH AT BEDTIME     PAST MEDICAL HISTORY: Past Medical History:  Diagnosis Date   Allergy    Arthritis    Coronary artery calcification    Diabetes mellitus without complication (HCC)    Facial pain    Hyperlipidemia    Hypertension    Vitamin D deficiency     PAST SURGICAL HISTORY: Past Surgical History:  Procedure Laterality Date   ABDOMINAL HYSTERECTOMY     pt reports full   COLONOSCOPY  2016   Dr. Claudette Head   COLONOSCOPY  02/18/2020   POLYPECTOMY     TOOTH EXTRACTION  2022   2 teeth pulled   TOTAL ABDOMINAL HYSTERECTOMY     per pt    FAMILY HISTORY: The patient family history includes Cancer in her son; Diabetes in her sister; Hypertension in her mother, sister, sister, sister, sister, and sister; Other in her father.  SOCIAL HISTORY:  The patient  reports that she has never smoked. She has never used smokeless tobacco. She reports that she does not drink alcohol and does not use drugs.  REVIEW OF SYSTEMS: Review of Systems  Cardiovascular:  Negative for chest pain, claudication, dyspnea on exertion, irregular heartbeat, leg swelling, near-syncope, orthopnea, palpitations, paroxysmal nocturnal dyspnea and syncope.  Respiratory:  Negative for shortness of breath.   Hematologic/Lymphatic: Negative for bleeding problem.  Musculoskeletal:  Negative for muscle cramps and myalgias.  Neurological:  Negative for dizziness and light-headedness.   PHYSICAL EXAM:    10/07/2022    1:42 PM  09/22/2022    3:23 PM 09/22/2022    3:17 PM  Vitals with BMI  Height 5\' 4"   5\' 4"   Weight 181 lbs  186 lbs  BMI 31.05  31.91  Systolic 147 206 161  Diastolic 83 120 127  Pulse 69 74 78   Upon recheck systolic blood pressure still greater than 200 mmHg bilaterally  Physical Exam  Constitutional: No distress.  Age appropriate, hemodynamically stable.   Neck: No JVD present.  Cardiovascular: Normal rate, regular rhythm, S1 normal, S2 normal, intact distal pulses and normal pulses. Exam reveals no gallop, no S3 and no S4.  No murmur heard. Pulmonary/Chest: Effort normal and breath sounds normal. No stridor. She has no wheezes. She has no rales.  Abdominal: Soft. Bowel sounds are normal. She exhibits no distension. There is no abdominal tenderness.  Musculoskeletal:        General: No edema.     Cervical back: Neck supple.  Neurological: She is alert and oriented to person, place, and time. She has intact cranial nerves (2-12).  Skin: Skin is warm and moist.   CARDIAC DATABASE: EKG:  September 06, 2022: Sinus rhythm, 72 bpm, nonspecific T wave abnormality.  No significant change compared to 01/31/2022  Echocardiogram: 07/30/2020:  Left ventricle cavity is normal in size. Mild concentric hypertrophy of the left ventricle. Normal global wall motion. Normal LV systolic function with EF 55%. Doppler evidence of grade I diastolic dysfunction, normal LAP.  Left atrial cavity is mildly dilated. Mild (Grade I) mitral regurgitation. Mild tricuspid regurgitation.  No evidence of pulmonary hypertension.  Stress Testing: No results found for this or any previous visit from the past 1095 days.  Heart Catheterization: None  Renal artery duplex  07/30/2020:  No evidence of renal artery occlusive disease in either renal artery. Normal intrarenal vascular perfusion is noted in both kidneys. Renal length is within normal limits for both kidneys. Normal abdominal aorta flow velocities noted. No  significant atherosclerotic plaque noted in the abdominal aorta.   Coronary CTA 08/11/2020:  1. Total coronary calcium score of 1404. This was 99th percentile for age and sex matched control. 2. Normal coronary origin with right dominance. 3. CAD-RADS 4. Approximately 70 percent stenosis in the proximal LAD due to mixed plaque. Ramus intermedius overall patent with diffuse calcified and noncalcified plaque. Moderate stenosis (50-69%) at the ostial CX due to calcified plaque. Diffuse calcified and noncalcified plaque within the proximal to mid RCA. Mild stenosis (25-49%) in distal RCA due to mixed plaque. 4. Aortic atherosclerosis within the ascending and the descending thoracic aorta. CT FFR analysis showed no significant stenosis in the proximal LAD or LCx distribution.  LABORATORY DATA:    Latest Ref Rng & Units 08/30/2021   12:05 PM 10/11/2020   10:22 AM 09/11/2020    6:57 PM  CBC  WBC 3.4 - 10.8 x10E3/uL 7.4  7.7  8.9   Hemoglobin 11.1 - 15.9 g/dL 09.6  04.5  40.9   Hematocrit 34.0 - 46.6 % 41.9  42.0  39.2   Platelets 150 -  450 x10E3/uL 255  244  253        Latest Ref Rng & Units 01/26/2022    3:16 PM 08/30/2021   12:05 PM 03/29/2021    4:31 PM  CMP  Glucose 70 - 99 mg/dL 161  096  045   BUN 8 - 27 mg/dL 14  19  23    Creatinine 0.57 - 1.00 mg/dL 4.09  8.11  9.14   Sodium 134 - 144 mmol/L 140  141  142   Potassium 3.5 - 5.2 mmol/L 3.7  3.5  3.6   Chloride 96 - 106 mmol/L 101  103  100   CO2 20 - 29 mmol/L 25  26  27    Calcium 8.7 - 10.3 mg/dL 9.9  78.2  95.6   Total Protein 6.0 - 8.5 g/dL 6.5   7.2   Total Bilirubin 0.0 - 1.2 mg/dL <2.1   0.2   Alkaline Phos 44 - 121 IU/L 89   82   AST 0 - 40 IU/L 13   17   ALT 0 - 32 IU/L 17   17     Lipid Panel     Component Value Date/Time   CHOL 242 (H) 01/26/2022 1516   TRIG 172 (H) 01/26/2022 1516   HDL 48 01/26/2022 1516   CHOLHDL 5.0 (H) 01/26/2022 1516   LDLCALC 163 (H) 01/26/2022 1516   LDLDIRECT 73 01/28/2021 0939   LABVLDL  31 01/26/2022 1516    No components found for: "NTPROBNP" No results for input(s): "PROBNP" in the last 8760 hours. No results for input(s): "TSH" in the last 8760 hours.   BMP Recent Labs    01/26/22 1516  NA 140  K 3.7  CL 101  CO2 25  GLUCOSE 145*  BUN 14  CREATININE 0.82  CALCIUM 9.9    HEMOGLOBIN A1C Lab Results  Component Value Date   HGBA1C 6.7 (H) 01/26/2022    IMPRESSION:    ICD-10-CM   1. Benign hypertension  I10 empagliflozin ([name]) 10 MG TABS tablet    Basic metabolic panel    2. Atherosclerosis of native coronary artery of native heart without angina pectoris  I25.10 Basic metabolic panel    3. Mixed hyperlipidemia  E78.2     4. Type 2 diabetes mellitus with hyperglycemia, without long-term current use of insulin (HCC)  E11.65 empagliflozin ([name]) 10 MG TABS tablet    Basic metabolic panel    5. Class 1 obesity due to excess calories with serious comorbidity and body mass index (BMI) of 31.0 to 31.9 in adult  E66.09    Z68.31        RECOMMENDATIONS: ROSINE LANGI is a 67 y.o. female whose past medical history and cardiac risk factors include: Severe coronary artery calcification, atherosclerosis of the coronary arteries per coronary CTA, HTN, HLD, NIDDM, postmenopausal female, obesity due to excess calories.  Benign hypertension Improving, still not at goal. Home blood pressure log reviewed. Discussed initiation of thiazide diuretics versus [name]. Shared decision was to start [name] 10 mg p.o. daily.  Medication profile discussed.  This should help both with diuresis and her underlying diabetes. I have asked her to continue to check her blood pressures at home and a goal SBP should be over 130 mmHg consistently given her comorbid conditions. Renal duplex still pending. Asked her to take her ARB at night, amlodipine in the morning, and carvedilol twice daily  Atherosclerosis of native coronary artery of native heart without angina  pectoris /  severe CAC Total CAC 1404. CAD RADS 4, CT FFR negative for hemodynamically significant stenosis. Continue aspirin and statin therapy. Lipids as of August 2023 are also not well-controlled. She would not tolerate statin therapy in the past.   In the past, requested that she have repeat fasting lipid profile once she has been on statin therapy for 6 weeks.  Patient states that she will have it rechecked with PCP.  Mixed hyperlipidemia Currently on rosuvastatin.   As discussed above.  Type 2 diabetes mellitus with hyperglycemia, without long-term current use of insulin (HCC) Reemphasized the importance of glycemic control. Currently managed by primary care provider. Continue ARB, statin therapy [name] initiated.  Patient assistance card provided.   FINAL MEDICATION LIST END OF ENCOUNTER: Meds ordered this encounter  Medications   empagliflozin ([name]) 10 MG TABS tablet    Sig: Take 1 tablet (10 mg total) by mouth daily before breakfast.    Dispense:  30 tablet    Refill:  0      Current Outpatient Medications:    amLODipine (NORVASC) 10 MG tablet, TAKE 1 TABLET(10 MG) BY MOUTH EVERY MORNING, Disp: 90 tablet, Rfl: 1   aspirin EC 81 MG tablet, Take 1 tablet (81 mg total) by mouth daily., Disp: 90 tablet, Rfl: 3   blood glucose meter kit and supplies, Dispense based on patient and insurance preference. Use up to twice a daily as directed. (FOR ICD-10 E10.9, E11.9)., Disp: 1 each, Rfl: 0   carvedilol (COREG) 25 MG tablet, TAKE 1 TABLET(25 MG) BY MOUTH TWICE DAILY WITH A MEAL, Disp: 180 tablet, Rfl: 0   empagliflozin ([name]) 10 MG TABS tablet, Take 1 tablet (10 mg total) by mouth daily before breakfast., Disp: 30 tablet, Rfl: 0   fluticasone (FLONASE) 50 MCG/ACT nasal spray, Place 2 sprays into both nostrils daily., Disp: , Rfl:    metFORMIN (GLUCOPHAGE) 500 MG tablet, TAKE 1 TABLET(500 MG) BY MOUTH DAILY WITH BREAKFAST, Disp: 90 tablet, Rfl: 1   olmesartan (BENICAR)  40 MG tablet, Take 1 tablet (40 mg total) by mouth every evening., Disp: 90 tablet, Rfl: 0   rosuvastatin (CRESTOR) 40 MG tablet, TAKE 1 TABLET(40 MG) BY MOUTH AT BEDTIME, Disp: 30 tablet, Rfl: 2  Orders Placed This Encounter  Procedures   Basic metabolic panel    There are no Patient Instructions on file for this visit.   --Continue cardiac medications as reconciled in final medication list. --Return in about 3 months (around 01/07/2023) for Follow up, BP. Or sooner if needed. --Continue follow-up with your primary care physician regarding the management of your other chronic comorbid conditions.  Patient's questions and concerns were addressed to her satisfaction. She voices understanding of the instructions provided during this encounter.   This note was created using a voice recognition software as a result there may be grammatical errors inadvertently enclosed that do not reflect the nature of this encounter. Every attempt is made to correct such errors.  Tessa Lerner, [place], [facility]  Pager:  [phone] Office: [phone]

## 2022-10-10 ENCOUNTER — Ambulatory Visit: Payer: BC Managed Care – PPO

## 2022-10-10 ENCOUNTER — Telehealth: Payer: Self-pay

## 2022-10-10 DIAGNOSIS — I10 Essential (primary) hypertension: Secondary | ICD-10-CM | POA: Diagnosis not present

## 2022-10-10 NOTE — Telephone Encounter (Signed)
Patient wanting to let you know that the Penny Rice is too expensive.

## 2022-10-10 NOTE — Telephone Encounter (Signed)
Can we do patient assistance for her?  Madden Garron [place], DO, [facility]

## 2022-10-13 ENCOUNTER — Telehealth: Payer: Self-pay

## 2022-10-13 ENCOUNTER — Other Ambulatory Visit: Payer: Self-pay

## 2022-10-13 MED ORDER — DAPAGLIFLOZIN PROPANEDIOL 10 MG PO TABS: 10.0000 mg | ORAL_TABLET | Freq: Every day | ORAL | 3 refills | Status: DC

## 2022-10-13 NOTE — Telephone Encounter (Signed)
Farxiga 10mg  was prescribed

## 2022-10-13 NOTE — Telephone Encounter (Signed)
Patient has tried patient assistance but was denied for jardiance please advise.

## 2022-10-13 NOTE — Telephone Encounter (Signed)
Can we due P2P -she has severe coronary artery calcification, diabetic.   Feige Lowdermilk [place], DO, [facility]

## 2022-10-21 NOTE — Progress Notes (Signed)
Called patient to inform her about her duplex results. Patient understood

## 2022-11-04 ENCOUNTER — Encounter: Payer: Self-pay | Admitting: *Deleted

## 2022-11-04 NOTE — Progress Notes (Signed)
Called advised pt that she is due for a visit. Pt will have to call back to schedule

## 2022-11-04 NOTE — Progress Notes (Signed)
[facility] Quality Team Note  Name: Penny Rice Date of Birth: 05-Jul-1955 MRN: [id number] Date: 11/04/2022  [facility] Quality Team has reviewed this patient's chart, please see recommendations below:  [facility] Quality Other; Pt has open gap for A1C.  Would need completed before end of 2024.  If in compliant range will close gap.

## 2022-12-14 ENCOUNTER — Other Ambulatory Visit: Payer: Self-pay | Admitting: Medical

## 2022-12-14 DIAGNOSIS — I2510 Atherosclerotic heart disease of native coronary artery without angina pectoris: Secondary | ICD-10-CM

## 2022-12-14 DIAGNOSIS — E782 Mixed hyperlipidemia: Secondary | ICD-10-CM

## 2022-12-14 DIAGNOSIS — E1165 Type 2 diabetes mellitus with hyperglycemia: Secondary | ICD-10-CM

## 2022-12-14 NOTE — Telephone Encounter (Signed)
Pt has an appt next week

## 2022-12-20 ENCOUNTER — Ambulatory Visit: Payer: BC Managed Care – PPO | Admitting: Medical

## 2022-12-20 ENCOUNTER — Encounter: Payer: Self-pay | Admitting: Medical

## 2022-12-20 VITALS — BP 122/82 | HR 64 | Ht 64.0 in | Wt 179.6 lb

## 2022-12-20 DIAGNOSIS — E785 Hyperlipidemia, unspecified: Secondary | ICD-10-CM

## 2022-12-20 DIAGNOSIS — I2510 Atherosclerotic heart disease of native coronary artery without angina pectoris: Secondary | ICD-10-CM

## 2022-12-20 DIAGNOSIS — Z23 Encounter for immunization: Secondary | ICD-10-CM | POA: Diagnosis not present

## 2022-12-20 DIAGNOSIS — Z90710 Acquired absence of both cervix and uterus: Secondary | ICD-10-CM | POA: Diagnosis not present

## 2022-12-20 DIAGNOSIS — I10 Essential (primary) hypertension: Secondary | ICD-10-CM

## 2022-12-20 DIAGNOSIS — E559 Vitamin D deficiency, unspecified: Secondary | ICD-10-CM | POA: Diagnosis not present

## 2022-12-20 DIAGNOSIS — E1169 Type 2 diabetes mellitus with other specified complication: Secondary | ICD-10-CM

## 2022-12-20 DIAGNOSIS — R7989 Other specified abnormal findings of blood chemistry: Secondary | ICD-10-CM | POA: Insufficient documentation

## 2022-12-20 DIAGNOSIS — E1165 Type 2 diabetes mellitus with hyperglycemia: Secondary | ICD-10-CM

## 2022-12-20 DIAGNOSIS — Z7185 Encounter for immunization safety counseling: Secondary | ICD-10-CM

## 2022-12-20 DIAGNOSIS — E782 Mixed hyperlipidemia: Secondary | ICD-10-CM

## 2022-12-20 DIAGNOSIS — Z114 Encounter for screening for human immunodeficiency virus [HIV]: Secondary | ICD-10-CM

## 2022-12-20 DIAGNOSIS — I7090 Unspecified atherosclerosis: Secondary | ICD-10-CM

## 2022-12-20 MED ORDER — METFORMIN HCL 500 MG PO TABS: ORAL_TABLET | ORAL | 1 refills | Status: DC

## 2022-12-20 MED ORDER — DAPAGLIFLOZIN PROPANEDIOL 10 MG PO TABS
10.0000 mg | ORAL_TABLET | Freq: Every day | ORAL | 3 refills | Status: DC
Start: 2022-12-20 — End: 2023-04-25

## 2022-12-20 MED ORDER — ASPIRIN 81 MG PO TBEC: 81.0000 mg | DELAYED_RELEASE_TABLET | Freq: Every day | ORAL | 3 refills | Status: DC

## 2022-12-20 MED ORDER — OLMESARTAN MEDOXOMIL 40 MG PO TABS: 40.0000 mg | ORAL_TABLET | Freq: Every evening | ORAL | 3 refills | Status: DC

## 2022-12-20 NOTE — Patient Instructions (Addendum)
Diabetes Updated labs today Continue to monitor your blood sugars at home fasting in the morning with goal less than 130 Continue metformin 500 mg once daily unless we call you back with other information based on the blood sugar numbers  High blood pressure Restart back on olmesartan 40 mg daily in the morning Continue amlodipine 10 mg daily in the morning Continue carvedilol Coreg but since you are having some dizziness symptoms try taking 1/2 tablet first thing in the morning and 1/2 tablet at lunch but take the whole tablet at night By splitting up the morning dose of carvedilol lets see if your dizziness gets better or not by splitting up the morning dose of carvedilol lets see if your dizziness gets better or not if not improving over the next week or 2 we may need to find another solution or go to a lower dose or do something different   High cholesterol Continue rosuvastatin Crestor 40 mg daily We are checking your cholesterol labs today  Atherosclerosis cholesterol buildup in the arteries, high cholesterol continue aspirin and rosuvastatin Crestor daily These 2 medications are typically taken at bedtime  Vitamin D deficiency You have been low in the past.  I am checking your labs today to see if you need to go back on prescription strength vitamin D  Vaccine counseling: You are due for shingles vaccine, Tdap tetanus vaccine and Prevnar 20 pneumococcal vaccine I recommend you get the shingles vaccine and tetanus vaccine at your pharmacy  Your parathyroid hormone level has been elevated in the past.  I am rechecking that today

## 2022-12-20 NOTE — Progress Notes (Signed)
Subjective:  Penny Rice is a 67 y.o. female who presents for Chief Complaint  Patient presents with   Diabetes    Fasting med check (crackers only). Does not have any Benicar and is not sure if she is supposed to be taking. Also brought in 2 other meds, not rx'd by you that she has questions about (not taking).     Here for fasting med check.  Diabetes-compliant with metformin and [place].  Checks blood sugars time.  Numbers have been okay at home.  No symptoms of concern  Hypertension-compliant with carvedilol and amlodipine but feels lightheaded and off balance sometimes in the mornings.  She thinks the blood pressure pill is too strong.  Been out of benicar probably close to 6 months.   In morning no energy and feels sick after taking the blood pressure medications.  She has seen her eye doctor within the last 6 months  Hyperlipidemia-compliant with Crestor and aspirin  She was diagnosed with trigeminal neuralgia on the left side of her face last year and had used carbamazepine for a while but discontinued it.  She has some left thyroid is starting to get some of the same symptoms again.  No other numbness, tingling, weakness, vision change confusion or other.  The symptoms are mild  No other aggravating or relieving factors.    No other c/o.  Past Medical History:  Diagnosis Date   Allergy    Arthritis    Coronary artery calcification    Diabetes mellitus without complication (HCC)    Facial pain    Hyperlipidemia    Hypertension    Vitamin D deficiency    Current Outpatient Medications on File Prior to Visit  Medication Sig Dispense Refill   amLODipine (NORVASC) 10 MG tablet TAKE 1 TABLET(10 MG) BY MOUTH EVERY MORNING 90 tablet 1   blood glucose meter kit and supplies Dispense based on patient and insurance preference. Use up to twice a daily as directed. (FOR ICD-10 E10.9, E11.9). 1 each 0   carvedilol (COREG) 25 MG tablet TAKE 1 TABLET(25 MG) BY MOUTH TWICE DAILY WITH A  MEAL 180 tablet 0   fluticasone (FLONASE) 50 MCG/ACT nasal spray Place 2 sprays into both nostrils daily.     rosuvastatin (CRESTOR) 40 MG tablet TAKE 1 TABLET(40 MG) BY MOUTH AT BEDTIME 30 tablet 0   [DISCONTINUED] potassium chloride (KLOR-CON) 10 MEQ tablet Take 1 tablet (10 mEq total) by mouth daily for 30 doses. 30 tablet 0   No current facility-administered medications on file prior to visit.     The following portions of the patient's history were reviewed and updated as appropriate: allergies, current medications, past family history, past medical history, past social history, past surgical history and problem list.  ROS Otherwise as in subjective above    Objective: BP 122/82 (BP Location: Left Arm, Cuff Size: Normal)   Pulse 64   Ht 5\' 4"  (1.626 m)   Wt 179 lb 9.6 oz (81.5 kg)   SpO2 95%   BMI 30.83 kg/m   General appearance: alert, no distress, well developed, well nourished Neck: supple, no lymphadenopathy, no thyromegaly, no masses Heart: RRR, normal S1, S2, no murmurs Lungs: CTA bilaterally, no wheezes, rhonchi, or rales Pulses: 2+ radial pulses, 2+ pedal pulses, normal cap refill Ext: no edema Neuro: CN II through XII intact, nonfocal exam  Diabetic Foot Exam - Simple   Simple Foot Form Diabetic Foot exam was performed with the following findings: Yes 12/20/2022  4:53 PM  Visual Inspection No deformities, no ulcerations, no other skin breakdown bilaterally: Yes Sensation Testing See comments: Yes Pulse Check Posterior Tibialis and Dorsalis pulse intact bilaterally: Yes Comments Poor sensation to monofilament throughout       Assessment: Encounter Diagnoses  Name Primary?   Type 2 diabetes mellitus with hyperglycemia, without long-term current use of insulin (HCC) Yes   Vitamin D deficiency    S/P hysterectomy    Hypertension, unspecified type    Dyslipidemia associated with type 2 diabetes mellitus (HCC)    Atherosclerosis    Increased PTH level     Vaccine counseling    Screening for HIV (human immunodeficiency virus)    Benign hypertension    Atherosclerosis of native coronary artery of native heart without angina pectoris    Mixed hyperlipidemia    Need for pneumococcal 20-valent conjugate vaccination      Plan: Diabetes Updated labs today Continue to monitor your blood sugars at home fasting in the morning with goal less than 130 Continue metformin 500 mg once daily unless we call you back with other information based on the blood sugar numbers  High blood pressure Restart back on olmesartan 40 mg daily in the morning Continue amlodipine 10 mg daily in the morning Continue carvedilol Coreg but since you are having some dizziness symptoms try taking 1/2 tablet first thing in the morning and 1/2 tablet at lunch but take the whole tablet at night By splitting up the morning dose of carvedilol lets see if your dizziness gets better or not by splitting up the morning dose of carvedilol lets see if your dizziness gets better or not if not improving over the next week or 2 we may need to find another solution or go to a lower dose or do something different   High cholesterol Continue rosuvastatin Crestor 40 mg daily We are checking your cholesterol labs today  Atherosclerosis cholesterol buildup in the arteries, high cholesterol continue aspirin and rosuvastatin Crestor daily These 2 medications are typically taken at bedtime  Vitamin D deficiency You have been low in the past.  I am checking your labs today to see if you need to go back on prescription strength vitamin D  Vaccine counseling: You are due for shingles vaccine, Tdap tetanus vaccine and Prevnar 20 pneumococcal vaccine I recommend you get the shingles vaccine and tetanus vaccine at your pharmacy  Counseled on the pneumococcal vaccine.  Vaccine information sheet given.  Pneumococcal vaccine Prevnar 20 given after consent obtained.   Your parathyroid hormone  level has been elevated in the past.  I am rechecking that today    Terrie was seen today for diabetes.  Diagnoses and all orders for this visit:  Type 2 diabetes mellitus with hyperglycemia, without long-term current use of insulin (HCC) -     Hemoglobin A1c -     Microalbumin/Creatinine Ratio, Urine -     Hepatitis C antibody -     Comprehensive metabolic panel  Vitamin D deficiency -     VITAMIN D 25 Hydroxy (Vit-D Deficiency, Fractures)  S/P hysterectomy  Hypertension, unspecified type  Dyslipidemia associated with type 2 diabetes mellitus (HCC) -     Lipid Panel  Atherosclerosis -     Lipid Panel  Increased PTH level -     PTH, Intact and Calcium -     TSH  Vaccine counseling  Screening for HIV (human immunodeficiency virus) -     HIV Antibody (routine testing w rflx) -  Hepatitis C antibody  Benign hypertension -     olmesartan (BENICAR) 40 MG tablet; Take 1 tablet (40 mg total) by mouth every evening.  Atherosclerosis of native coronary artery of native heart without angina pectoris  Mixed hyperlipidemia  Need for pneumococcal 20-valent conjugate vaccination -     Pneumococcal conjugate vaccine 20-valent (Prevnar 20)  Other orders -     aspirin EC 81 MG tablet; Take 1 tablet (81 mg total) by mouth daily. -     dapagliflozin propanediol (FARXIGA) 10 MG TABS tablet; Take 1 tablet (10 mg total) by mouth daily. -     metFORMIN (GLUCOPHAGE) 500 MG tablet; TAKE 1 TABLET(500 MG) BY MOUTH DAILY WITH BREAKFAST    Follow up: pending labs

## 2022-12-21 ENCOUNTER — Encounter: Payer: Self-pay | Admitting: Internal Medicine

## 2022-12-21 ENCOUNTER — Other Ambulatory Visit: Payer: Self-pay | Admitting: Internal Medicine

## 2022-12-21 DIAGNOSIS — R7989 Other specified abnormal findings of blood chemistry: Secondary | ICD-10-CM

## 2022-12-21 LAB — COMPREHENSIVE METABOLIC PANEL
ALT: 29 IU/L (ref 0–32)
AST: 21 IU/L (ref 0–40)
Albumin: 4.4 g/dL (ref 3.9–4.9)
Alkaline Phosphatase: 101 IU/L (ref 44–121)
BUN/Creatinine Ratio: 14 (ref 12–28)
BUN: 12 mg/dL (ref 8–27)
Bilirubin Total: 0.4 mg/dL (ref 0.0–1.2)
CO2: 24 mmol/L (ref 20–29)
Calcium: 10.2 mg/dL (ref 8.7–10.3)
Chloride: 104 mmol/L (ref 96–106)
Creatinine, Ser: 0.87 mg/dL (ref 0.57–1.00)
Globulin, Total: 2.4 g/dL (ref 1.5–4.5)
Glucose: 100 mg/dL — ABNORMAL HIGH (ref 70–99)
Potassium: 4 mmol/L (ref 3.5–5.2)
Sodium: 141 mmol/L (ref 134–144)
Total Protein: 6.8 g/dL (ref 6.0–8.5)
eGFR: 74 mL/min/{1.73_m2} (ref >59)

## 2022-12-21 LAB — LIPID PANEL
Chol/HDL Ratio: 2.9 ratio (ref 0.0–4.4)
Cholesterol, Total: 165 mg/dL (ref 100–199)
HDL: 56 mg/dL (ref >39)
LDL Chol Calc (NIH): 87 mg/dL (ref 0–99)
Triglycerides: 122 mg/dL (ref 0–149)
VLDL Cholesterol Cal: 22 mg/dL (ref 5–40)

## 2022-12-21 LAB — PTH, INTACT AND CALCIUM: PTH: 86 pg/mL — ABNORMAL HIGH (ref 15–65)

## 2022-12-21 LAB — MICROALBUMIN / CREATININE URINE RATIO
Creatinine, Urine: 158.9 mg/dL
Microalb/Creat Ratio: 5 mg/g creat (ref 0–29)
Microalbumin, Urine: 7.9 ug/mL

## 2022-12-21 LAB — HIV ANTIBODY (ROUTINE TESTING W REFLEX): HIV Screen 4th Generation wRfx: NONREACTIVE

## 2022-12-21 LAB — TSH: TSH: 0.538 u[IU]/mL (ref 0.450–4.500)

## 2022-12-21 LAB — HEPATITIS C ANTIBODY: Hep C Virus Ab: NONREACTIVE

## 2022-12-21 LAB — HEMOGLOBIN A1C
Est. average glucose Bld gHb Est-mCnc: 154 mg/dL
Hgb A1c MFr Bld: 7 % — ABNORMAL HIGH (ref 4.8–5.6)

## 2022-12-21 LAB — VITAMIN D 25 HYDROXY (VIT D DEFICIENCY, FRACTURES): Vit D, 25-Hydroxy: 34.1 ng/mL (ref 30.0–100.0)

## 2022-12-21 NOTE — Progress Notes (Signed)
Referred to endocrinology if no prior evaluation for elevated prolactin

## 2022-12-21 NOTE — Progress Notes (Signed)
 Results sent through MyChart

## 2022-12-28 ENCOUNTER — Other Ambulatory Visit: Payer: Self-pay | Admitting: Neurology

## 2022-12-28 ENCOUNTER — Other Ambulatory Visit: Payer: Self-pay | Admitting: Endocrinology

## 2022-12-28 ENCOUNTER — Telehealth: Payer: Self-pay | Admitting: Diagnostic Neuroimaging

## 2022-12-28 DIAGNOSIS — E559 Vitamin D deficiency, unspecified: Secondary | ICD-10-CM | POA: Diagnosis not present

## 2022-12-28 DIAGNOSIS — I10 Essential (primary) hypertension: Secondary | ICD-10-CM | POA: Diagnosis not present

## 2022-12-28 DIAGNOSIS — E213 Hyperparathyroidism, unspecified: Secondary | ICD-10-CM | POA: Diagnosis not present

## 2022-12-28 DIAGNOSIS — R7989 Other specified abnormal findings of blood chemistry: Secondary | ICD-10-CM | POA: Diagnosis not present

## 2022-12-28 DIAGNOSIS — E1165 Type 2 diabetes mellitus with hyperglycemia: Secondary | ICD-10-CM | POA: Diagnosis not present

## 2022-12-28 DIAGNOSIS — E010 Iodine-deficiency related diffuse (endemic) goiter: Secondary | ICD-10-CM

## 2022-12-28 DIAGNOSIS — E7800 Pure hypercholesterolemia, unspecified: Secondary | ICD-10-CM | POA: Diagnosis not present

## 2022-12-28 MED ORDER — CARBAMAZEPINE ER 100 MG PO CP12: 100.0000 mg | ORAL_CAPSULE | Freq: Two times a day (BID) | ORAL | 6 refills | Status: DC

## 2022-12-28 NOTE — Telephone Encounter (Signed)
Patient had re occurring symptoms of facial pain in the nasal passage and scheduled an appointment with Dr. Marjory Lies for October. Pain has came back in June and she said that it has been consistent. Patient was asking if she was prescribed medication for this and Baird Lyons confirmed she was. The prescription can be sent to [facility] on [facility] [address] [facility]. Fyi-Her cell/mychart isn't working

## 2022-12-28 NOTE — Telephone Encounter (Signed)
Per Dr Richrd Humbles note in the office visit from October the patient was no longer having concerns or pain. At that visit Dr Marjory Lies did state could restart the carbamazepine if the pain came back. I have entered the script and sent to the pharmacy as requested  "LEFT TRIGEMINAL NEURALGIA (left V1, V2) - now resolved; monitor  - may restart CBZ 100mg  twice a day in future if pain returns"

## 2022-12-30 ENCOUNTER — Other Ambulatory Visit: Payer: Self-pay | Admitting: Medical

## 2022-12-30 DIAGNOSIS — Z1231 Encounter for screening mammogram for malignant neoplasm of breast: Secondary | ICD-10-CM

## 2023-01-11 ENCOUNTER — Ambulatory Visit: Payer: BC Managed Care – PPO

## 2023-01-13 ENCOUNTER — Ambulatory Visit: Payer: BC Managed Care – PPO | Admitting: Cardiology

## 2023-01-16 ENCOUNTER — Ambulatory Visit
Admission: RE | Admit: 2023-01-16 | Discharge: 2023-01-16 | Disposition: A | Discharge status: Home / Self Care | Payer: BC Managed Care – PPO | Source: Ambulatory Visit | Attending: Endocrinology | Admitting: Endocrinology

## 2023-01-16 DIAGNOSIS — E010 Iodine-deficiency related diffuse (endemic) goiter: Secondary | ICD-10-CM

## 2023-01-16 DIAGNOSIS — E042 Nontoxic multinodular goiter: Secondary | ICD-10-CM | POA: Diagnosis not present

## 2023-01-17 ENCOUNTER — Other Ambulatory Visit: Payer: Self-pay | Admitting: Endocrinology

## 2023-01-17 DIAGNOSIS — E010 Iodine-deficiency related diffuse (endemic) goiter: Secondary | ICD-10-CM

## 2023-02-03 ENCOUNTER — Ambulatory Visit
Admission: RE | Admit: 2023-02-03 | Discharge: 2023-02-03 | Disposition: A | Discharge status: Home / Self Care | Payer: BC Managed Care – PPO | Source: Ambulatory Visit | Attending: Medical | Admitting: Medical

## 2023-02-03 DIAGNOSIS — Z1231 Encounter for screening mammogram for malignant neoplasm of breast: Secondary | ICD-10-CM

## 2023-02-08 NOTE — Progress Notes (Signed)
Results sent through MyChart

## 2023-02-13 ENCOUNTER — Ambulatory Visit
Admission: RE | Admit: 2023-02-13 | Discharge: 2023-02-13 | Disposition: A | Discharge status: Home / Self Care | Payer: BC Managed Care – PPO | Source: Ambulatory Visit | Attending: Endocrinology | Admitting: Endocrinology

## 2023-02-13 ENCOUNTER — Other Ambulatory Visit (HOSPITAL_COMMUNITY)
Admission: RE | Admit: 2023-02-13 | Discharge: 2023-02-13 | Disposition: A | Discharge status: Home / Self Care | Payer: BC Managed Care – PPO | Source: Ambulatory Visit | Attending: Interventional Radiology | Admitting: Interventional Radiology

## 2023-02-13 DIAGNOSIS — E041 Nontoxic single thyroid nodule: Secondary | ICD-10-CM

## 2023-02-13 DIAGNOSIS — E010 Iodine-deficiency related diffuse (endemic) goiter: Secondary | ICD-10-CM

## 2023-02-13 NOTE — Procedures (Signed)
Interventional Radiology Procedure Note  Procedure:  US guided left thyroid nodule FNA.  With AFIRMA Complications: None Recommendations:    - Routine care   Signed,  Yvone Neu. Loreta Ave, DO

## 2023-02-15 LAB — CYTOLOGY - NON PAP

## 2023-03-21 ENCOUNTER — Encounter: Payer: Self-pay | Admitting: Diagnostic Neuroimaging

## 2023-03-21 ENCOUNTER — Ambulatory Visit (INDEPENDENT_AMBULATORY_CARE_PROVIDER_SITE_OTHER): Payer: BC Managed Care – PPO | Admitting: Diagnostic Neuroimaging

## 2023-03-21 VITALS — BP 191/112 | HR 71 | Ht 64.0 in | Wt 178.8 lb

## 2023-03-21 DIAGNOSIS — G500 Trigeminal neuralgia: Secondary | ICD-10-CM

## 2023-03-21 MED ORDER — CARBAMAZEPINE ER 100 MG PO CP12: 100.0000 mg | ORAL_CAPSULE | Freq: Two times a day (BID) | ORAL | 4 refills | Status: DC

## 2023-03-21 NOTE — Progress Notes (Signed)
[facility]  PATIENT: Penny Rice DOB: 14-Jan-1956  REFERRING CLINICIAN: Aleen Campi Kermit Balo, PA-C HISTORY FROM: patient  REASON FOR VISIT: follow up   HISTORICAL  CHIEF COMPLAINT:  Chief Complaint  Patient presents with   Follow-up    Patient in room #6 and alone. Patient states she is having pain on the left side of her face. Patient is needing refills on her Carbamazepine 100mg .    HISTORY OF PRESENT ILLNESS:   UPDATE (03/21/23, VRP): Since last visit, doing well, until June / July 2024, then some return of left facial pain twinges. Now on CBZ 100mg  daily - twice a day and doing well.   UPDATE (04/04/22, VRP): Since last visit, doing well. Pain is now resolved. Took CBZ for 2 months, then pain resolved. Now off CBZ. Doing well.   PRIOR HPI (09/21/21): 67 year old female here for evaluation of left mandibular pain.  Patient has had intermittent shocklike stabbing electrical shooting pain sensation lasting 3 to 5 seconds at a time, occurring every few months.  Symptoms started about 2 years ago.  She went to dentist and oral surgeon.  She had some extractions in the left upper molar region.  Unfortunately this did not help.  She tried some different pain medicines with mild relief.   REVIEW OF SYSTEMS: Full 14 system review of systems performed and negative with exception of: as per HPI.  ALLERGIES: No Known Allergies  HOME MEDICATIONS: Outpatient Medications Prior to Visit  Medication Sig Dispense Refill   amLODipine (NORVASC) 10 MG tablet TAKE 1 TABLET(10 MG) BY MOUTH EVERY MORNING 90 tablet 1   aspirin EC 81 MG tablet Take 1 tablet (81 mg total) by mouth daily. 90 tablet 3   blood glucose meter kit and supplies Dispense based on patient and insurance preference. Use up to twice a daily as directed. (FOR ICD-10 E10.9, E11.9). 1 each 0   carvedilol (COREG) 25 MG tablet TAKE 1 TABLET(25 MG) BY MOUTH TWICE DAILY WITH A MEAL 180 tablet 0   dapagliflozin  propanediol (FARXIGA) 10 MG TABS tablet Take 1 tablet (10 mg total) by mouth daily. 90 tablet 3   fluticasone (FLONASE) 50 MCG/ACT nasal spray Place 2 sprays into both nostrils daily.     metFORMIN (GLUCOPHAGE) 500 MG tablet TAKE 1 TABLET(500 MG) BY MOUTH DAILY WITH BREAKFAST 90 tablet 1   rosuvastatin (CRESTOR) 40 MG tablet TAKE 1 TABLET(40 MG) BY MOUTH AT BEDTIME 30 tablet 0   carbamazepine (CARBATROL) 100 MG 12 hr capsule Take 1 capsule (100 mg total) by mouth 2 (two) times daily. 60 capsule 6   olmesartan (BENICAR) 40 MG tablet Take 1 tablet (40 mg total) by mouth every evening. 90 tablet 3   No facility-administered medications prior to visit.    PAST MEDICAL HISTORY: Past Medical History:  Diagnosis Date   Allergy    Arthritis    Coronary artery calcification    Diabetes mellitus without complication (HCC)    Facial pain    Hyperlipidemia    Hypertension    Vitamin D deficiency     PAST SURGICAL HISTORY: Past Surgical History:  Procedure Laterality Date   ABDOMINAL HYSTERECTOMY     pt reports full   COLONOSCOPY  2016   Dr. Claudette Head   COLONOSCOPY  02/18/2020   POLYPECTOMY     TOOTH EXTRACTION  2022   2 teeth pulled   TOTAL ABDOMINAL HYSTERECTOMY     per pt    FAMILY HISTORY: Family  History  Problem Relation Age of Onset   Hypertension Mother    Other Father        "violent death"   Hypertension Sister    Diabetes Sister    Hypertension Sister    Hypertension Sister    Hypertension Sister    Hypertension Sister    Cancer Son        lung   Colon cancer Neg Hx    Heart disease Neg Hx    Stroke Neg Hx    Colon polyps Neg Hx    Rectal cancer Neg Hx    Stomach cancer Neg Hx     SOCIAL HISTORY: Social History   Socioeconomic History   Marital status: Married    Spouse name: Not on file   Number of children: 2   Years of education: Not on file   Highest education level: Not on file  Occupational History   Not on file  Tobacco Use   Smoking  status: Never   Smokeless tobacco: Never  Vaping Use   Vaping status: Never Used  Substance and Sexual Activity   Alcohol use: No    Alcohol/week: 0.0 standard drinks of alcohol   Drug use: No   Sexual activity: Yes  Other Topics Concern   Not on file  Social History Narrative   Lives with husband, has 2 sons, no grandchildren.   Works at [employer]   01/2020   Social Determinants of Health   Financial Resource Strain: Not on file  Food Insecurity: Not on file  Transportation Needs: Not on file  Physical Activity: Not on file  Stress: Not on file  Social Connections: Not on file  Intimate Partner Violence: Not on file     PHYSICAL EXAM  GENERAL EXAM/CONSTITUTIONAL: Vitals:  Vitals:   03/21/23 1451  BP: (!) 191/112  Pulse: 71  Weight: 178 lb 12.8 oz (81.1 kg)  Height: 5\' 4"  (1.626 m)   Body mass index is 30.69 kg/m. Wt Readings from Last 3 Encounters:  03/21/23 178 lb 12.8 oz (81.1 kg)  12/20/22 179 lb 9.6 oz (81.5 kg)  10/07/22 181 lb (82.1 kg)   Patient is in no distress; well developed, nourished and groomed; neck is supple  CARDIOVASCULAR: Examination of carotid arteries is normal; no carotid bruits Regular rate and rhythm, no murmurs Examination of peripheral vascular system by observation and palpation is normal  EYES: Ophthalmoscopic exam of optic discs and posterior segments is normal; no papilledema or hemorrhages No results found.  MUSCULOSKELETAL: Gait, strength, tone, movements noted in Neurologic exam below  NEUROLOGIC: MENTAL STATUS:      No data to display         awake, alert, oriented to person, place and time recent and remote memory intact normal attention and concentration language fluent, comprehension intact, naming intact fund of knowledge appropriate  CRANIAL NERVE:  2nd - no papilledema on fundoscopic exam 2nd, 3rd, 4th, 6th - pupils equal and reactive to light, visual fields full to confrontation, extraocular muscles  intact, no nystagmus 5th - facial sensation symmetric 7th - facial strength symmetric 8th - hearing intact 9th - palate elevates symmetrically, uvula midline 11th - shoulder shrug symmetric 12th - tongue protrusion midline  MOTOR:  normal bulk and tone, full strength in the BUE, BLE  SENSORY:  normal and symmetric to light touch, temperature, vibration  COORDINATION:  finger-nose-finger, fine finger movements normal  REFLEXES:  deep tendon reflexes present and symmetric  GAIT/STATION:  narrow based  gait    DIAGNOSTIC DATA (LABS, IMAGING, TESTING) - I reviewed patient records, labs, notes, testing and imaging myself where available.  Lab Results  Component Value Date   WBC 7.4 08/30/2021   HGB 13.9 08/30/2021   HCT 41.9 08/30/2021   MCV 88 08/30/2021   PLT 255 08/30/2021      Component Value Date/Time   NA 141 12/20/2022 1413   K 4.0 12/20/2022 1413   CL 104 12/20/2022 1413   CO2 24 12/20/2022 1413   GLUCOSE 100 (H) 12/20/2022 1413   GLUCOSE 120 (H) 10/11/2020 1022   BUN 12 12/20/2022 1413   CREATININE 0.87 12/20/2022 1413   CALCIUM 10.2 12/20/2022 1413   PROT 6.8 12/20/2022 1413   ALBUMIN 4.4 12/20/2022 1413   AST 21 12/20/2022 1413   ALT 29 12/20/2022 1413   ALKPHOS 101 12/20/2022 1413   BILITOT 0.4 12/20/2022 1413   GFRNONAA >60 10/11/2020 1022   GFRAA 88 07/23/2020 1559   Lab Results  Component Value Date   CHOL 165 12/20/2022   HDL 56 12/20/2022   LDLCALC 87 12/20/2022   LDLDIRECT 73 01/28/2021   TRIG 122 12/20/2022   CHOLHDL 2.9 12/20/2022   Lab Results  Component Value Date   HGBA1C 7.0 (H) 12/20/2022   No results found for: "VITAMINB12" Lab Results  Component Value Date   TSH 0.538 12/20/2022    10/12/21 MRI brain (without) demonstrating: - Stable, moderate periventricular and subcortical foci of nonspecific T2 hyperintensities, likely chronic small vessel ischemic disease.  - No acute findings.   ASSESSMENT AND PLAN  67 y.o. 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    Dx:  1. Left-sided trigeminal neuralgia     PLAN:  LEFT TRIGEMINAL NEURALGIA (left V1, V2) - continue CBZ 100mg  twice a day  HYPERTENSION - on BP meds; recheck at home and follow up with PCP and cardiology  Meds ordered this encounter  Medications   carbamazepine (CARBATROL) 100 MG 12 hr capsule    Sig: Take 1 capsule (100 mg total) by mouth 2 (two) times daily.    Dispense:  180 capsule    Refill:  4   Return in about 9 months (around 12/19/2023) for with NP, MyChart visit (15 min).    Suanne Marker, MD 03/21/2023, 3:18 PM Certified in Neurology, Neurophysiology and Neuroimaging  [facility] [address] [place], [place] [address] [phone]

## 2023-04-08 ENCOUNTER — Other Ambulatory Visit: Payer: Self-pay | Admitting: Medical

## 2023-04-10 NOTE — Telephone Encounter (Signed)
Pt was given a 6 month supply in July. Pt has an appt coming up

## 2023-04-24 ENCOUNTER — Ambulatory Visit (INDEPENDENT_AMBULATORY_CARE_PROVIDER_SITE_OTHER): Payer: BC Managed Care – PPO | Admitting: Medical

## 2023-04-24 VITALS — BP 120/78 | HR 68 | Wt 178.4 lb

## 2023-04-24 DIAGNOSIS — R7989 Other specified abnormal findings of blood chemistry: Secondary | ICD-10-CM | POA: Diagnosis not present

## 2023-04-24 DIAGNOSIS — E1165 Type 2 diabetes mellitus with hyperglycemia: Secondary | ICD-10-CM | POA: Diagnosis not present

## 2023-04-24 DIAGNOSIS — H6123 Impacted cerumen, bilateral: Secondary | ICD-10-CM | POA: Diagnosis not present

## 2023-04-24 DIAGNOSIS — Z23 Encounter for immunization: Secondary | ICD-10-CM | POA: Diagnosis not present

## 2023-04-24 DIAGNOSIS — I7090 Unspecified atherosclerosis: Secondary | ICD-10-CM

## 2023-04-24 DIAGNOSIS — I10 Essential (primary) hypertension: Secondary | ICD-10-CM

## 2023-04-24 DIAGNOSIS — N761 Subacute and chronic vaginitis: Secondary | ICD-10-CM

## 2023-04-24 DIAGNOSIS — E559 Vitamin D deficiency, unspecified: Secondary | ICD-10-CM

## 2023-04-24 DIAGNOSIS — Z780 Asymptomatic menopausal state: Secondary | ICD-10-CM

## 2023-04-24 DIAGNOSIS — E1169 Type 2 diabetes mellitus with other specified complication: Secondary | ICD-10-CM

## 2023-04-24 DIAGNOSIS — E785 Hyperlipidemia, unspecified: Secondary | ICD-10-CM

## 2023-04-24 NOTE — Patient Instructions (Signed)
Vit D deficiency - update labs today, not currently on vit D supplement  PTH elevated - I reviewed her endocrinology notes from earlier this year.   They are monitoring Vit D and PTH levels.  Updated vitamin D level today.  Question primary parahyperthroidism vs secondary due to vit D deficiency   Dyslipidemia  Continue Crestor /rosuvastatin 40mg  daily Continue Aspirin 81mg  daily  HTN  Continue current medications, Olmesartan 40mg  daily Continue Carvedilol/Coreg 25mg  twice daily Continue Amlodipine 10mg  daily  Diabetes  Labs today Continue Metformin Discontinue Farxiga due to yeast infection   Counseled on the influenza virus vaccine.  Vaccine information sheet given.  Influenza vaccine given after consent obtained.   Vaccine counseling: Get your Tdap tetanus booster at the pharmacy   Post menopausal estrogen deficiency Please call to schedule your bone density test.   [facility]  [phone] [address], [address] [place], [place] [address]   Vaginitis  Since starting Farxiga, has had irritation vaginally worried about yeast.  We will discontinue Marcelline Deist for now.  London Pepper was not covered by insurance prior, so re-consider [name] as an option for diabetes control.

## 2023-04-24 NOTE — Progress Notes (Unsigned)
Subjective:  Penny Rice is a 67 y.o. female who presents for Chief Complaint  Patient presents with   Medical Management of Chronic Issues    4 month follow-up. Feels like she is getting a yeast infection from taking farixga     Here for med check.  After reviewing her medications, she states that she is not out of any medicines except for metformin although chart records from suggest that she has run out of some of these medications.  She says she has plenty of her other medicines at home  Diabetes-not checking blood sugars.  Compliant with metformin and [place].  However she constantly feels like she has a yeast infection ever since starting [place].  She would like to stop this.  No concern for STD.  No bleeding vaginally.  Hypertension-compliant with amlodipine, carvedilol, olmesartan   Hyperlipidemia-compliant with aspirin and rosuvastatin daily  She has occasional brief dizziness that happen recently.  She does not think it is vertigo.  It was very brief.  No chest pain, palpitations, confusion, slurred speech, no fall.  Elevated PTH-she is following up urology a few months ago for this  No other aggravating or relieving factors.    No other c/o.  Past Medical History:  Diagnosis Date   Allergy    Arthritis    Coronary artery calcification    Diabetes mellitus without complication (HCC)    Facial pain    Hyperlipidemia    Hypertension    Vitamin D deficiency    Current Outpatient Medications on File Prior to Visit  Medication Sig Dispense Refill   amLODipine (NORVASC) 10 MG tablet TAKE 1 TABLET(10 MG) BY MOUTH EVERY MORNING 90 tablet 1   aspirin EC 81 MG tablet Take 1 tablet (81 mg total) by mouth daily. 90 tablet 3   carbamazepine (CARBATROL) 100 MG 12 hr capsule Take 1 capsule (100 mg total) by mouth 2 (two) times daily. 180 capsule 4   carvedilol (COREG) 25 MG tablet TAKE 1 TABLET(25 MG) BY MOUTH TWICE DAILY WITH A MEAL 180 tablet 0   dapagliflozin propanediol  (FARXIGA) 10 MG TABS tablet Take 1 tablet (10 mg total) by mouth daily. 90 tablet 3   fluticasone (FLONASE) 50 MCG/ACT nasal spray Place 2 sprays into both nostrils daily.     metFORMIN (GLUCOPHAGE) 500 MG tablet TAKE 1 TABLET(500 MG) BY MOUTH DAILY WITH BREAKFAST 90 tablet 1   rosuvastatin (CRESTOR) 40 MG tablet TAKE 1 TABLET(40 MG) BY MOUTH AT BEDTIME 30 tablet 0   blood glucose meter kit and supplies Dispense based on patient and insurance preference. Use up to twice a daily as directed. (FOR ICD-10 E10.9, E11.9). 1 each 0   olmesartan (BENICAR) 40 MG tablet Take 1 tablet (40 mg total) by mouth every evening. 90 tablet 3   [DISCONTINUED] potassium chloride (KLOR-CON) 10 MEQ tablet Take 1 tablet (10 mEq total) by mouth daily for 30 doses. 30 tablet 0   No current facility-administered medications on file prior to visit.     The following portions of the patient's history were reviewed and updated as appropriate: allergies, current medications, past family history, past medical history, past social history, past surgical history and problem list.  ROS Otherwise as in subjective above     Objective: BP 120/78   Pulse 68   Wt 178 lb 6.4 oz (80.9 kg)   BMI 30.62 kg/m   BP Readings from Last 3 Encounters:  04/24/23 120/78  03/21/23 (!) 191/112  12/20/22  122/82   Wt Readings from Last 3 Encounters:  04/24/23 178 lb 6.4 oz (80.9 kg)  03/21/23 178 lb 12.8 oz (81.1 kg)  12/20/22 179 lb 9.6 oz (81.5 kg)    General appearance: alert, no distress, well developed, well nourished HEENT: normocephalic, sclerae anicteric, conjunctiva pink and moist, TMs normal but only can see a small portion of the TMs, she has a lot of dry wax in the ear canals,, nares patent, no discharge or erythema, pharynx normal Oral cavity: MMM, no lesions Neck: supple, no lymphadenopathy, no thyromegaly, no masses Heart: RRR, normal S1, S2, no murmurs Lungs: CTA bilaterally, no wheezes, rhonchi, or  rales Pulses: 2+ radial pulses, 2+ pedal pulses, normal cap refill Ext: no edema Neuro: CN II through XII intact, nonfocal exam    Assessment: Encounter Diagnoses  Name Primary?   Type 2 diabetes mellitus with hyperglycemia, without long-term current use of insulin (HCC) Yes   Needs flu shot    Vitamin D deficiency    Hypertension, unspecified type    Dyslipidemia associated with type 2 diabetes mellitus (HCC)    Atherosclerosis    Increased PTH level    Postmenopausal estrogen deficiency    Subacute vaginitis    Bilateral impacted cerumen    Impacted cerumen of both ears [H61.23]      Plan: Vit D deficiency - update labs today, not currently on vit D supplement   PTH elevated - I reviewed her endocrinology notes from earlier this year.   They are monitoring Vit D and PTH levels.  Updated vitamin D level today.  Question primary parahyperthroidism vs secondary due to vit D deficiency   Dyslipidemia  Continue Crestor /rosuvastatin 40mg  daily Continue Aspirin 81mg  daily  HTN  Continue current medications, Olmesartan 40mg  daily Continue Carvedilol/Coreg 25mg  twice daily Continue Amlodipine 10mg  daily  Diabetes  Labs today Continue Metformin Discontinue Farxiga due to yeast infection   Counseled on the influenza virus vaccine.  Vaccine information sheet given.  Influenza vaccine given after consent obtained.   Vaccine counseling: Get your Tdap tetanus booster at the pharmacy   Post menopausal estrogen deficiency Please call to schedule your bone density test.   [facility]  [phone] [address], [address] [place], [place] [address]   Vaginitis  Since starting Farxiga, has had irritation vaginally worried about yeast.  We will discontinue Marcelline Deist for now.  [place] Pepper was not covered by insurance prior, so re-consider [name] as an option for diabetes control.  Impacted cerumen bilat Discussed findings.  Discussed  risk/benefits of procedure and patient agrees to procedure. Successfully used warm water lavage to remove impacted cerumen from right ear canal. Not complete success in left.  Patient tolerated procedure well. Advised they avoid using any cotton swabs or other devices to clean the ear canals.  Use basic hygiene as discussed.  Follow up in a week or 2 for recheck on left ear.  Can use some debrox OTC 2 x/ week until then.   Montina was seen today for medical management of chronic issues.  Diagnoses and all orders for this visit:  Type 2 diabetes mellitus with hyperglycemia, without long-term current use of insulin (HCC) -     CBC -     Hemoglobin A1c  Needs flu shot -     Flu Vaccine Trivalent High Dose (Fluad)  Vitamin D deficiency -     VITAMIN D 25 Hydroxy (Vit-D Deficiency, Fractures)  Hypertension, unspecified type  Dyslipidemia associated  with type 2 diabetes mellitus (HCC)  Atherosclerosis  Increased PTH level -     CBC  Postmenopausal estrogen deficiency -     DG Bone Density; Future  Subacute vaginitis  Bilateral impacted cerumen  Impacted cerumen of both ears [H61.23]   Follow up: pending labs

## 2023-04-25 ENCOUNTER — Other Ambulatory Visit: Payer: Self-pay | Admitting: Medical

## 2023-04-25 DIAGNOSIS — Z23 Encounter for immunization: Secondary | ICD-10-CM | POA: Diagnosis not present

## 2023-04-25 DIAGNOSIS — E1165 Type 2 diabetes mellitus with hyperglycemia: Secondary | ICD-10-CM | POA: Diagnosis not present

## 2023-04-25 LAB — HEMOGLOBIN A1C
Est. average glucose Bld gHb Est-mCnc: 151 mg/dL
Hgb A1c MFr Bld: 6.9 % — ABNORMAL HIGH (ref 4.8–5.6)

## 2023-04-25 LAB — CBC
Hematocrit: 42 % (ref 34.0–46.6)
Hemoglobin: 13.4 g/dL (ref 11.1–15.9)
MCH: 29.3 pg (ref 26.6–33.0)
MCHC: 31.9 g/dL (ref 31.5–35.7)
MCV: 92 fL (ref 79–97)
Platelets: 240 10*3/uL (ref 150–450)
RBC: 4.58 x10E6/uL (ref 3.77–5.28)
RDW: 13 % (ref 11.7–15.4)
WBC: 8.8 10*3/uL (ref 3.4–10.8)

## 2023-04-25 LAB — VITAMIN D 25 HYDROXY (VIT D DEFICIENCY, FRACTURES): Vit D, 25-Hydroxy: 25.8 ng/mL — ABNORMAL LOW (ref 30.0–100.0)

## 2023-04-25 MED ORDER — VITAMIN D (ERGOCALCIFEROL) 1.25 MG (50000 UNIT) PO CAPS: 50000.0000 [IU] | ORAL_CAPSULE | ORAL | 3 refills | Status: DC

## 2023-04-25 NOTE — Progress Notes (Signed)
Results sent through MyChart

## 2023-04-28 ENCOUNTER — Other Ambulatory Visit: Payer: Self-pay | Admitting: Internal Medicine

## 2023-04-28 MED ORDER — METFORMIN HCL 500 MG PO TABS: ORAL_TABLET | ORAL | 1 refills | Status: DC

## 2023-05-02 ENCOUNTER — Encounter: Payer: Self-pay | Admitting: Internal Medicine

## 2023-06-22 DIAGNOSIS — E113292 Type 2 diabetes mellitus with mild nonproliferative diabetic retinopathy without macular edema, left eye: Secondary | ICD-10-CM | POA: Diagnosis not present

## 2023-06-22 DIAGNOSIS — H3562 Retinal hemorrhage, left eye: Secondary | ICD-10-CM | POA: Diagnosis not present

## 2023-06-22 LAB — HM DIABETES EYE EXAM

## 2023-06-27 ENCOUNTER — Other Ambulatory Visit: Payer: Self-pay | Admitting: Cardiology

## 2023-06-27 ENCOUNTER — Telehealth: Payer: Self-pay | Admitting: Medical

## 2023-06-27 DIAGNOSIS — Z780 Asymptomatic menopausal state: Secondary | ICD-10-CM

## 2023-06-27 NOTE — Telephone Encounter (Signed)
Pt was notified.  Order as been replaced

## 2023-06-27 NOTE — Telephone Encounter (Signed)
She is ready to schedule bone density, can't you redo order, current order has expired

## 2023-07-17 DIAGNOSIS — M8589 Other specified disorders of bone density and structure, multiple sites: Secondary | ICD-10-CM | POA: Diagnosis not present

## 2023-07-17 LAB — HM DEXA SCAN

## 2023-08-08 ENCOUNTER — Other Ambulatory Visit: Payer: Self-pay | Admitting: Medical

## 2023-08-08 MED ORDER — AMLODIPINE BESYLATE 10 MG PO TABS: 10.0000 mg | ORAL_TABLET | Freq: Every day | ORAL | 0 refills | Status: DC

## 2023-08-08 NOTE — Telephone Encounter (Signed)
 Copied from CRM [id number]. Topic: Clinical - Medication Refill >> Aug 08, 2023  2:01 PM Priscille Loveless wrote: Most Recent Primary Care Visit:  Provider: Jac Canavan  Department: [facility]  Visit Type: FOLLOW UP 30  Date: 04/24/2023  Medication: amLODipine (NORVASC) 10 MG tablet   Has the patient contacted their pharmacy? Yes  Is this the correct pharmacy for this prescription? Yes This is the patient's preferred pharmacy:   [id number] - [place], [place] - [address] AT [facility] [address] [place] [id number] Phone: [phone] Fax: [phone]   Has the prescription been filled recently? Yes  Is the patient out of the medication? Yes  Has the patient been seen for an appointment in the last year OR does the patient have an upcoming appointment? Yes  Can we respond through MyChart? No  Agent: Please be advised that Rx refills may take up to 3 business days. We ask that you follow-up with your pharmacy.

## 2023-08-18 DIAGNOSIS — H3562 Retinal hemorrhage, left eye: Secondary | ICD-10-CM | POA: Diagnosis not present

## 2023-08-18 DIAGNOSIS — E113292 Type 2 diabetes mellitus with mild nonproliferative diabetic retinopathy without macular edema, left eye: Secondary | ICD-10-CM | POA: Diagnosis not present

## 2023-08-18 DIAGNOSIS — H35033 Hypertensive retinopathy, bilateral: Secondary | ICD-10-CM | POA: Diagnosis not present

## 2023-08-22 ENCOUNTER — Ambulatory Visit: Payer: BC Managed Care – PPO | Admitting: Medical

## 2023-08-22 VITALS — BP 122/80 | HR 65 | Wt 176.0 lb

## 2023-08-22 DIAGNOSIS — E1169 Type 2 diabetes mellitus with other specified complication: Secondary | ICD-10-CM | POA: Diagnosis not present

## 2023-08-22 DIAGNOSIS — E559 Vitamin D deficiency, unspecified: Secondary | ICD-10-CM | POA: Diagnosis not present

## 2023-08-22 DIAGNOSIS — I10 Essential (primary) hypertension: Secondary | ICD-10-CM

## 2023-08-22 DIAGNOSIS — I7090 Unspecified atherosclerosis: Secondary | ICD-10-CM

## 2023-08-22 DIAGNOSIS — R7989 Other specified abnormal findings of blood chemistry: Secondary | ICD-10-CM

## 2023-08-22 DIAGNOSIS — E1165 Type 2 diabetes mellitus with hyperglycemia: Secondary | ICD-10-CM

## 2023-08-22 DIAGNOSIS — E782 Mixed hyperlipidemia: Secondary | ICD-10-CM

## 2023-08-22 DIAGNOSIS — E785 Hyperlipidemia, unspecified: Secondary | ICD-10-CM

## 2023-08-22 DIAGNOSIS — I2510 Atherosclerotic heart disease of native coronary artery without angina pectoris: Secondary | ICD-10-CM

## 2023-08-22 DIAGNOSIS — M8580 Other specified disorders of bone density and structure, unspecified site: Secondary | ICD-10-CM

## 2023-08-22 MED ORDER — CARVEDILOL 25 MG PO TABS: ORAL_TABLET | ORAL | 1 refills | Status: DC

## 2023-08-22 MED ORDER — ROSUVASTATIN CALCIUM 40 MG PO TABS: ORAL_TABLET | ORAL | 1 refills | Status: DC

## 2023-08-22 MED ORDER — AMLODIPINE BESYLATE 10 MG PO TABS: 10.0000 mg | ORAL_TABLET | Freq: Every day | ORAL | 1 refills | Status: DC

## 2023-08-22 NOTE — Progress Notes (Signed)
 Subjective:  Penny Rice is a 68 y.o. female who presents for Chief Complaint  Patient presents with   Medical Management of Chronic Issues    4 month follow-up medcheck/diabetes, indigestion     Here for med check.  She is retiring this week.  She is excited about that.  She had a bone density test February 2025 showing osteopenia.  This was done through Dr. Willeen Cass office  Hypertension-compliant with amlodipine daily and carvedilol twice daily but she sometimes misses the olmesartan  Hyperlipidemia she takes her cholesterol medicine more than half a tablet not every day.  She takes her aspirin daily  Diabetes-she is not checking her blood sugars.  She is not having any symptoms of concern.  She is metformin 500 mg daily.  She was on Farxiga but stopped that due to recurrent yeast infection  No other aggravating or relieving factors.    No other c/o.  Past Medical History:  Diagnosis Date   Allergy    Arthritis    Coronary artery calcification    Diabetes mellitus without complication (HCC)    Facial pain    Hyperlipidemia    Hypertension    Vitamin D deficiency    Current Outpatient Medications on File Prior to Visit  Medication Sig Dispense Refill   aspirin EC 81 MG tablet Take 1 tablet (81 mg total) by mouth daily. 90 tablet 3   carbamazepine (CARBATROL) 100 MG 12 hr capsule Take 1 capsule (100 mg total) by mouth 2 (two) times daily. 180 capsule 4   fluticasone (FLONASE) 50 MCG/ACT nasal spray Place 2 sprays into both nostrils daily.     metFORMIN (GLUCOPHAGE) 500 MG tablet TAKE 1 TABLET(500 MG) BY MOUTH DAILY WITH BREAKFAST 90 tablet 1   olmesartan (BENICAR) 40 MG tablet Take 1 tablet (40 mg total) by mouth every evening. 90 tablet 3   Vitamin D, Ergocalciferol, (DRISDOL) 1.25 MG (50000 UNIT) CAPS capsule Take 1 capsule (50,000 Units total) by mouth every 7 (seven) days. 12 capsule 3   blood glucose meter kit and supplies Dispense based on patient and insurance  preference. Use up to twice a daily as directed. (FOR ICD-10 E10.9, E11.9). 1 each 0   [DISCONTINUED] potassium chloride (KLOR-CON) 10 MEQ tablet Take 1 tablet (10 mEq total) by mouth daily for 30 doses. 30 tablet 0   No current facility-administered medications on file prior to visit.     The following portions of the patient's history were reviewed and updated as appropriate: allergies, current medications, past family history, past medical history, past social history, past surgical history and problem list.  ROS Otherwise as in subjective above  Objective: BP 122/80   Pulse 65   Wt 176 lb (79.8 kg)   BMI 30.21 kg/m   General appearance: alert, no distress, well developed, well nourished Neck: supple, no lymphadenopathy, no thyromegaly, no masses Heart: RRR, normal S1, S2, no murmurs Lungs: CTA bilaterally, no wheezes, rhonchi, or rales Pulses: 2+ radial pulses, 2+ pedal pulses, normal cap refill Ext: no edema   Assessment: Encounter Diagnoses  Name Primary?   Type 2 diabetes mellitus with hyperglycemia, without long-term current use of insulin (HCC) Yes   Dyslipidemia associated with type 2 diabetes mellitus (HCC)    Vitamin D deficiency    Hypertension, unspecified type    Atherosclerosis    Osteopenia, unspecified location    High serum parathyroid hormone (PTH)    Atherosclerosis of native coronary artery of native heart without angina  pectoris    Mixed hyperlipidemia    Essential hypertension      Plan: Congratulations on your retirement  Diabetes Continue metformin 500 mg daily I encourage you to start checking her blood sugars again.  You have not been checking them lately.  Goal is between 80-130 fasting in the morning and before meals Check your feet daily for sores and wounds We discussed diet and exercise today  Dyslipidemia Continue aspirin 81 mg daily Continue Crestor rosuvastatin 40 mg daily  Hypertension Continue amlodipine 10 mg  daily Continue carvedilol 25 mg twice daily Continue olmesartan 40 mg daily Goal blood pressure is around 120/70  Vitamin D deficiency Continue vitamin D weekly supplement 50,000 units Get out in the sun for sun exposure to help absorb vitamin D  Osteopenia Exercises with cardio such as walking or bicycle or group classes at the [facility] regularly most days per week Do weightbearing exercise resistance exercises at least 2 times a week to keep your bones strong Get calcium 1200 mg in the diet or through supplement daily along with taking her vitamin D weekly  Atherosclerosis of arteries-continue your cholesterol medication and aspirin as above   PTH elevated I reviewed her July 2024 endocrinology notes.  They recommended watch and wait approach on the PTH.  They felt it would probably normalize once her vitamin D stabilized.  She is not quite good just 3 months out on prescription vitamin D.  Lets plan to recheck your vitamin D and PTH at your next visit    Penny Rice was seen today for medical management of chronic issues.  Diagnoses and all orders for this visit:  Type 2 diabetes mellitus with hyperglycemia, without long-term current use of insulin (HCC) -     rosuvastatin (CRESTOR) 40 MG tablet; TAKE 1 TABLET(40 MG) BY MOUTH AT BEDTIME  Dyslipidemia associated with type 2 diabetes mellitus (HCC)  Vitamin D deficiency  Hypertension, unspecified type  Atherosclerosis  Osteopenia, unspecified location  High serum parathyroid hormone (PTH)  Atherosclerosis of native coronary artery of native heart without angina pectoris -     rosuvastatin (CRESTOR) 40 MG tablet; TAKE 1 TABLET(40 MG) BY MOUTH AT BEDTIME  Mixed hyperlipidemia -     rosuvastatin (CRESTOR) 40 MG tablet; TAKE 1 TABLET(40 MG) BY MOUTH AT BEDTIME  Essential hypertension -     carvedilol (COREG) 25 MG tablet; TAKE 1 TABLET(25 MG) BY MOUTH TWICE DAILY WITH A MEAL  Other orders -     amLODipine (NORVASC) 10 MG tablet;  Take 1 tablet (10 mg total) by mouth daily.    Follow up: 4-6 mo for well visit

## 2023-09-04 ENCOUNTER — Encounter: Payer: Self-pay | Admitting: Internal Medicine

## 2023-09-23 ENCOUNTER — Other Ambulatory Visit: Payer: Self-pay | Admitting: Medical

## 2023-09-25 NOTE — Telephone Encounter (Signed)
   Pt has an appt on 10/11/23. Pt should not be out until 10/26/23

## 2023-09-28 ENCOUNTER — Other Ambulatory Visit: Payer: Self-pay | Admitting: Medical

## 2023-09-28 NOTE — Telephone Encounter (Signed)
 Copied from CRM [id number]. Topic: Clinical - Medication Refill >> Sep 28, 2023  1:39 PM Rosaria Common wrote: Most Recent Primary Care Visit:  Provider: Claudene Crystal  Department: [facility]  Visit Type: MED MGMT 30  Date: 08/22/2023  Medication: metFORMIN  (GLUCOPHAGE ) 500 MG tablet  Has the patient contacted their pharmacy? Yes (Agent: If no, request that the patient contact the pharmacy for the refill. If patient does not wish to contact the pharmacy document the reason why and proceed with request.) (Agent: If yes, when and what did the pharmacy advise?)  Is this the correct pharmacy for this prescription? Yes If no, delete pharmacy and type the correct one.  This is the patient's preferred pharmacy:  [id number] - [place], [place] - [address] AT [facility] OF [address] [place] [address] Phone: [phone] Fax: [phone]   Has the prescription been filled recently? Yes  Is the patient out of the medication? Yes  Has the patient been seen for an appointment in the last year OR does the patient have an upcoming appointment? Yes  Can we respond through MyChart? Yes  Agent: Please be advised that Rx refills may take up to 3 business days. We ask that you follow-up with your pharmacy.

## 2023-09-28 NOTE — Telephone Encounter (Signed)
   This was prescribed for 6 months on 04/28/23. Pt should not be out until 10/26/23

## 2023-10-01 ENCOUNTER — Other Ambulatory Visit: Payer: Self-pay | Admitting: Medical

## 2023-10-10 ENCOUNTER — Telehealth: Payer: Self-pay | Admitting: Medical

## 2023-10-10 MED ORDER — METFORMIN HCL 500 MG PO TABS: ORAL_TABLET | ORAL | 0 refills | Status: DC

## 2023-10-10 NOTE — Telephone Encounter (Signed)
 Refilled for 30 days

## 2023-10-10 NOTE — Telephone Encounter (Signed)
 Pt is needing a refill on metformin . She currently does not have insurance because they messed up the medicare she was supposed to have. She states as soon as they get her insurance corrected she will reschedule her cpe. If it cannot be refilled she would like a call.  She uses  [id number] - [place], [place] - [address] AT [facility] OF [address] & [address]

## 2023-10-11 ENCOUNTER — Ambulatory Visit: Payer: Self-pay | Admitting: Medical

## 2023-11-26 ENCOUNTER — Other Ambulatory Visit: Payer: Self-pay | Admitting: Medical

## 2023-11-26 DIAGNOSIS — E1165 Type 2 diabetes mellitus with hyperglycemia: Secondary | ICD-10-CM

## 2023-11-26 DIAGNOSIS — E782 Mixed hyperlipidemia: Secondary | ICD-10-CM

## 2023-11-26 DIAGNOSIS — I2510 Atherosclerotic heart disease of native coronary artery without angina pectoris: Secondary | ICD-10-CM

## 2023-12-19 ENCOUNTER — Telehealth: Payer: Self-pay | Admitting: Diagnostic Neuroimaging

## 2023-12-19 NOTE — Telephone Encounter (Signed)
 Request to cx due to a conflict with insurance, will call back to r/s

## 2023-12-21 ENCOUNTER — Telehealth: Payer: BC Managed Care – PPO | Admitting: Adult Health

## 2024-01-11 ENCOUNTER — Encounter (HOSPITAL_BASED_OUTPATIENT_CLINIC_OR_DEPARTMENT_OTHER): Payer: Self-pay

## 2024-01-11 ENCOUNTER — Ambulatory Visit: Payer: Self-pay

## 2024-01-11 ENCOUNTER — Emergency Department (HOSPITAL_BASED_OUTPATIENT_CLINIC_OR_DEPARTMENT_OTHER)
Admission: EM | Admit: 2024-01-11 | Discharge: 2024-01-11 | Disposition: A | Discharge status: Home / Self Care | Attending: Emergency Medicine | Admitting: Emergency Medicine

## 2024-01-11 ENCOUNTER — Other Ambulatory Visit: Payer: Self-pay

## 2024-01-11 DIAGNOSIS — G500 Trigeminal neuralgia: Secondary | ICD-10-CM | POA: Insufficient documentation

## 2024-01-11 DIAGNOSIS — E119 Type 2 diabetes mellitus without complications: Secondary | ICD-10-CM | POA: Insufficient documentation

## 2024-01-11 DIAGNOSIS — Z7982 Long term (current) use of aspirin: Secondary | ICD-10-CM | POA: Diagnosis not present

## 2024-01-11 DIAGNOSIS — Z79899 Other long term (current) drug therapy: Secondary | ICD-10-CM | POA: Diagnosis not present

## 2024-01-11 DIAGNOSIS — I10 Essential (primary) hypertension: Secondary | ICD-10-CM | POA: Insufficient documentation

## 2024-01-11 DIAGNOSIS — I2510 Atherosclerotic heart disease of native coronary artery without angina pectoris: Secondary | ICD-10-CM | POA: Insufficient documentation

## 2024-01-11 DIAGNOSIS — R519 Headache, unspecified: Secondary | ICD-10-CM | POA: Diagnosis present

## 2024-01-11 DIAGNOSIS — Z7984 Long term (current) use of oral hypoglycemic drugs: Secondary | ICD-10-CM | POA: Insufficient documentation

## 2024-01-11 MED ORDER — AMOXICILLIN-POT CLAVULANATE 875-125 MG PO TABS: 1.0000 | ORAL_TABLET | Freq: Two times a day (BID) | ORAL | 0 refills | Status: AC

## 2024-01-11 NOTE — Telephone Encounter (Signed)
 FYI Only or Action Required?: FYI only for provider.  Patient was last seen in primary care on 08/22/2023 by Bulah Alm RAMAN, PA-C.  Called Nurse Triage reporting Facial Pain.  Symptoms began a week ago.  Interventions attempted: Nothing.  Symptoms are: gradually worsening.  Triage Disposition: See HCP Within 4 Hours (Or PCP Triage)  Patient/caregiver understands and will follow disposition?: Yes           Copied from CRM #[id number]. Topic: Clinical - Red Word Triage >> Jan 11, 2024 11:21 AM Graeme ORN wrote: Red Word that prompted transfer to Nurse Triage: Twisting nerve jumping in face and pain with in on left side of face.    ----------------------------------------------------------------------- From previous Reason for Contact - Scheduling: Patient/patient representative is calling to schedule an appointment. Refer to attachments for appointment information. Reason for Disposition  [1] Weakness of the face, arm / hand, or leg / foot on one side of the body AND [2] gradual onset (e.g., days to weeks) AND [3] present now    Denies weakness, experiencing facial pain and nerve twitching  Answer Assessment - Initial Assessment Questions Patient reports she has experienced similar sx in the past and was prescribed pills but was unable to identify what she was prescribed.  Notes facial pain to L face and nerve twitching under left eye. Denies weakness, numbness, vision changes, HA, dizziness, speech changes, CP, SOB. No OV available with PCP or neuro per pt, advised to proceed to UC for eval/treat, pt verbalized understanding and agrees to plan.          1. SYMPTOM: What is the main symptom you are concerned about? (e.g., weakness, numbness)     Left side facial pain 10/10 at worst, twitching under eye 2. ONSET: When did this start? (e.g., minutes, hours, days; while sleeping)     X 1 week, worsening today 3. LAST NORMAL: When was the last time you (the patient)  were normal (no symptoms)?     About 1 week ago 4. PATTERN Does this come and go, or has it been constant since it started?  Is it present now?     Intermittent 5. CARDIAC SYMPTOMS: Have you had any of the following symptoms: chest pain, difficulty breathing, palpitations?     None 6. NEUROLOGIC SYMPTOMS: Have you had any of the following symptoms: headache, dizziness, vision loss, double vision, changes in speech, unsteady on your feet?     None 7. OTHER SYMPTOMS: Do you have any other symptoms?     None  Protocols used: Neurologic Deficit-A-AH

## 2024-01-11 NOTE — Discharge Instructions (Addendum)
 Continue to take carbamazepine  100mg  twice a day and follow up with your Neurologist

## 2024-01-11 NOTE — ED Provider Notes (Signed)
 [place] EMERGENCY DEPARTMENT AT [facility] Provider Note   CSN: [id number] Arrival date & time: 01/11/24  1139     Patient presents with: Facial Pain   Penny Rice is a 68 y.o. female.   HPI      68yo female with history of DM, CAD, hypertension, hyperlipidemia, left-sided trigeminal neuralgia for which she has seen neurology and has been placed on carbamazepine  100 mg twice a day, who presents with worsening facial pain today.  Pain feels like it did when saw the Neurologist in the past THrobbing pain, shoots for a few seconds, episodes and it hits, then goes away. Triggered by touch at times Has been 2 years off and on, worse now Left facial pain 2 years ago saw ENT, dentist, then Neurologist and diagnosed trigeminal neuralgia Today similar quality of pain but worse, felt like first episode she has had Throbbing severe pain left side coming and going laterally to the nose Since then having a little bit of pain, put oil in ear maybe feels better No ear pain, put drops in yesterday Tried metamucil ? Or a sinus medication yesterday. But has not had runny nose, has had drainage, does note new drainage to back of sinuses No fever, no headache No nausea or vomiting Denies numbness, weakness, difficulty talking or walking, visual changes or facial droop.    Has had the carbamazepine  capsule, but not other medicine not known   Past Medical History:  Diagnosis Date   Allergy    Arthritis    Coronary artery calcification    Diabetes mellitus without complication (HCC)    Facial pain    Hyperlipidemia    Hypertension    Vitamin D  deficiency      Prior to Admission medications   Medication Sig Start Date End Date Taking? Authorizing Provider  amoxicillin -clavulanate (AUGMENTIN ) 875-125 MG tablet Take 1 tablet by mouth every 12 (twelve) hours for 7 days. 01/11/24 01/18/24 Yes Danaiya Steadman, Rocky, MD  amLODipine  (NORVASC ) 10 MG tablet TAKE 1 TABLET(10 MG) BY MOUTH DAILY.  NEED APPOINTMENT FOR REFILLS 10/02/23   Tysinger, Alm RAMAN, PA-C  aspirin  EC 81 MG tablet Take 1 tablet (81 mg total) by mouth daily. 12/20/22   Tysinger, Alm RAMAN, PA-C  blood glucose meter kit and supplies Dispense based on patient and insurance preference. Use up to twice a daily as directed. (FOR ICD-10 E10.9, E11.9). 04/06/21   Tysinger, Alm RAMAN, PA-C  carbamazepine  (CARBATROL ) 100 MG 12 hr capsule Take 1 capsule (100 mg total) by mouth 2 (two) times daily. 03/21/23   Penumalli, Vikram R, MD  carvedilol  (COREG ) 25 MG tablet TAKE 1 TABLET(25 MG) BY MOUTH TWICE DAILY WITH A MEAL 08/22/23   Tysinger, Alm RAMAN, PA-C  fluticasone (FLONASE) 50 MCG/ACT nasal spray Place 2 sprays into both nostrils daily. 12/04/21   [provider]  metFORMIN  (GLUCOPHAGE ) 500 MG tablet TAKE 1 TABLET(500 MG) BY MOUTH DAILY WITH BREAKFAST 11/27/23   Joyce Norleen BROCKS, MD  olmesartan  (BENICAR ) 40 MG tablet Take 1 tablet (40 mg total) by mouth every evening. 12/20/22 08/22/23  Tysinger, Alm RAMAN, PA-C  rosuvastatin  (CRESTOR ) 40 MG tablet TAKE 1 TABLET(40 MG) BY MOUTH AT BEDTIME 11/27/23   Joyce Norleen BROCKS, MD  Vitamin D , Ergocalciferol , (DRISDOL ) 1.25 MG (50000 UNIT) CAPS capsule Take 1 capsule (50,000 Units total) by mouth every 7 (seven) days. 04/25/23   Tysinger, Alm RAMAN, PA-C  potassium chloride  (KLOR-CON ) 10 MEQ tablet Take 1 tablet (10 mEq total) by mouth  daily for 30 doses. 09/11/20 09/12/20  Cottie Donnice PARAS, MD    Allergies: Patient has no known allergies.    Review of Systems  Updated Vital Signs BP (!) 202/92   Pulse 68   Temp 98.2 F (36.8 C) (Oral)   Resp 16   SpO2 93%   Physical Exam Vitals and nursing note reviewed.  Constitutional:      General: She is not in acute distress.    Appearance: Normal appearance. She is well-developed. She is not ill-appearing or diaphoretic.  HENT:     Head: Normocephalic and atraumatic.     Comments: Pain elicted left midface, no sign of abscess nor swelling, no  erythema Eyes:     General: No visual field deficit.    Extraocular Movements: Extraocular movements intact.     Conjunctiva/sclera: Conjunctivae normal.     Pupils: Pupils are equal, round, and reactive to light.  Cardiovascular:     Rate and Rhythm: Normal rate and regular rhythm.     Pulses: Normal pulses.     Heart sounds: Normal heart sounds. No murmur heard.    No friction rub. No gallop.  Pulmonary:     Effort: Pulmonary effort is normal. No respiratory distress.     Breath sounds: Normal breath sounds. No wheezing or rales.  Abdominal:     General: There is no distension.     Palpations: Abdomen is soft.     Tenderness: There is no abdominal tenderness. There is no guarding.  Musculoskeletal:        General: No swelling or tenderness.     Cervical back: Normal range of motion.  Skin:    General: Skin is warm and dry.     Findings: No erythema or rash.  Neurological:     General: No focal deficit present.     Mental Status: She is alert and oriented to person, place, and time.     GCS: GCS eye subscore is 4. GCS verbal subscore is 5. GCS motor subscore is 6.     Cranial Nerves: No cranial nerve deficit, dysarthria or facial asymmetry.     Sensory: No sensory deficit.     Motor: No weakness or tremor.     Coordination: Coordination normal. Finger-Nose-Finger Test normal.     Gait: Gait normal.     Comments: Pain elicited left midface      (all labs ordered are listed, but only abnormal results are displayed) Labs Reviewed - No data to display  EKG: None  Radiology: No results found.   Procedures   Medications Ordered in the ED - No data to display                                   68yo female with history of DM, CAD, hypertension, hyperlipidemia, left-sided trigeminal neuralgia for which she has seen neurology and has been placed on carbamazepine  100 mg twice a day, who presents with worsening facial pain today.  Has high blood pressures and has not  taken medications today--She is without headache, no neurologic symptoms, no chest pain, no shortness of breath and have low suspicion for hypertensive emergencies including low suspicion for [facility], hypertensive encephalopathy, stroke, MI, aortic dissection, pulmonary edema.  Regarding facial pain--has been diagnosed with trigeminal neuralgia and history and exam consistent with this. Do not see signs of dental infection, no headache, symptoms not consistent with CVA or ICH, location  and history not consistent with ACS, dissection.    Suspect symptoms related to trigeminal neuralgia. Has sinus drainage, discussed it is difficult in setting of sinus tenderness to exclude this and in discussion decided to treat empirically for possible sinusitis.   Recommend continued carbamazepine  for trigeminal neuralgia, PCP follow up for blood pressures. Patient discharged in stable condition with understanding of reasons to return.     Final diagnoses:  Trigeminal neuralgia  Hypertension, unspecified type    ED Discharge Orders          Ordered    amoxicillin -clavulanate (AUGMENTIN ) 875-125 MG tablet  Every 12 hours        01/11/24 1347               Dreama Longs, MD 01/11/24 2200

## 2024-01-11 NOTE — Telephone Encounter (Signed)
Pt needs an appt. Please schedule

## 2024-01-11 NOTE — ED Triage Notes (Addendum)
 Patient reports history of left sided trigeminal neuralgia. States worse today.  States forgot to take her home BP medicine for the past 2 days

## 2024-01-11 NOTE — Telephone Encounter (Signed)
 Left message and sent My Chart message for pt to schedule appt but looks like she is in ER

## 2024-01-16 ENCOUNTER — Ambulatory Visit: Admitting: Medical

## 2024-01-16 VITALS — BP 130/80 | HR 68 | Wt 173.2 lb

## 2024-01-16 DIAGNOSIS — I2510 Atherosclerotic heart disease of native coronary artery without angina pectoris: Secondary | ICD-10-CM

## 2024-01-16 DIAGNOSIS — E1165 Type 2 diabetes mellitus with hyperglycemia: Secondary | ICD-10-CM | POA: Diagnosis not present

## 2024-01-16 DIAGNOSIS — G500 Trigeminal neuralgia: Secondary | ICD-10-CM

## 2024-01-16 DIAGNOSIS — I10 Essential (primary) hypertension: Secondary | ICD-10-CM | POA: Diagnosis not present

## 2024-01-16 DIAGNOSIS — E1169 Type 2 diabetes mellitus with other specified complication: Secondary | ICD-10-CM | POA: Diagnosis not present

## 2024-01-16 DIAGNOSIS — E782 Mixed hyperlipidemia: Secondary | ICD-10-CM

## 2024-01-16 DIAGNOSIS — E785 Hyperlipidemia, unspecified: Secondary | ICD-10-CM | POA: Diagnosis not present

## 2024-01-16 MED ORDER — CARBAMAZEPINE ER 100 MG PO CP12: 100.0000 mg | ORAL_CAPSULE | Freq: Two times a day (BID) | ORAL | 0 refills | Status: DC

## 2024-01-16 MED ORDER — METFORMIN HCL 500 MG PO TABS: ORAL_TABLET | ORAL | 1 refills | Status: DC

## 2024-01-16 MED ORDER — OLMESARTAN MEDOXOMIL 40 MG PO TABS: 40.0000 mg | ORAL_TABLET | Freq: Every evening | ORAL | 1 refills | Status: DC

## 2024-01-16 MED ORDER — ROSUVASTATIN CALCIUM 40 MG PO TABS: ORAL_TABLET | ORAL | 1 refills | Status: DC

## 2024-01-16 MED ORDER — CARVEDILOL 25 MG PO TABS: ORAL_TABLET | ORAL | 1 refills | Status: DC

## 2024-01-16 MED ORDER — ASPIRIN 81 MG PO TBEC: 81.0000 mg | DELAYED_RELEASE_TABLET | Freq: Every day | ORAL | 3 refills | Status: AC

## 2024-01-16 MED ORDER — BACLOFEN 10 MG PO TABS: 10.0000 mg | ORAL_TABLET | Freq: Two times a day (BID) | ORAL | 0 refills | Status: DC | PRN

## 2024-01-16 MED ORDER — AMLODIPINE BESYLATE 10 MG PO TABS: 10.0000 mg | ORAL_TABLET | Freq: Every day | ORAL | 1 refills | Status: DC

## 2024-01-16 NOTE — Progress Notes (Signed)
 Subjective:  Penny Rice is a 68 y.o. female who presents for Chief Complaint  Patient presents with   Hospitalization Follow-up    Hospital follow-up. Still having left sided facial pain that still comes and goes but still having sinus drainage with it.      Here for hospital follow up.  She has a history of trigeminal neuralgia.  She was taking carbamazepine  twice daily every day but had cut back on this for a while as she does not want to be on so many pills.  Recently the facial pains started flaring up  She ended up going to the emergency department recently due to worsening pain.  They prescribed Augmentin  just in case things were flaring up from an infection standpoint such as a tooth infection but she has no obvious infection  She is still having some of the facial pains.  She sees neurology in general.  She takes her blood pressure and cholesterol medicines but feels like she is on too many medicines in general  No other aggravating or relieving factors.    No other c/o.  Past Medical History:  Diagnosis Date   Allergy    Arthritis    Coronary artery calcification    Diabetes mellitus without complication (HCC)    Facial pain    Hyperlipidemia    Hypertension    Vitamin D  deficiency    Current Outpatient Medications on File Prior to Visit  Medication Sig Dispense Refill   amoxicillin -clavulanate (AUGMENTIN ) 875-125 MG tablet Take 1 tablet by mouth every 12 (twelve) hours for 7 days. 14 tablet 0   fluticasone (FLONASE) 50 MCG/ACT nasal spray Place 2 sprays into both nostrils daily.     Vitamin D , Ergocalciferol , (DRISDOL ) 1.25 MG (50000 UNIT) CAPS capsule Take 1 capsule (50,000 Units total) by mouth every 7 (seven) days. 12 capsule 3   blood glucose meter kit and supplies Dispense based on patient and insurance preference. Use up to twice a daily as directed. (FOR ICD-10 E10.9, E11.9). 1 each 0   [DISCONTINUED] potassium chloride  (KLOR-CON ) 10 MEQ tablet Take 1 tablet  (10 mEq total) by mouth daily for 30 doses. 30 tablet 0   No current facility-administered medications on file prior to visit.     The following portions of the patient's history were reviewed and updated as appropriate: allergies, current medications, past family history, past medical history, past social history, past surgical history and problem list.  ROS Otherwise as in subjective above     Objective: BP 130/80   Pulse 68   Wt 173 lb 3.2 oz (78.6 kg)   BMI 29.73 kg/m   General appearance: alert, no distress, well developed, well nourished HEENT: normocephalic, sclerae anicteric, conjunctiva pink and moist, TMs pearly, nares patent, no discharge or erythema, pharynx normal Oral cavity: MMM, no lesions Neck: supple, no lymphadenopathy, no thyromegaly, no masses Heart: RRR, normal S1, S2, no murmurs Lungs: CTA bilaterally, no wheezes, rhonchi, or rales Pulses: 2+ radial pulses, 2+ pedal pulses, normal cap refill Ext: no edema   Assessment: Encounter Diagnoses  Name Primary?   Trigeminal neuralgia Yes   Hypertension, unspecified type    Dyslipidemia associated with type 2 diabetes mellitus (HCC)    Type 2 diabetes mellitus with hyperglycemia, without long-term current use of insulin (HCC)    Atherosclerosis of native coronary artery of native heart without angina pectoris    Mixed hyperlipidemia    Benign hypertension    Essential hypertension  Plan: I reviewed her recent emergency department notes.  She will finish out the Augmentin  in case there is any potential sinus infection or tooth infection.  She had not been taking her carbamazepine  regularly as directed.  She will get back on carbamazepine  100 mg twice daily for trigeminal neuralgia.  She can try some short-term baclofen  to see if that helps as well  I encouraged her to consider going back on the mouthguard per her dentist for tooth clenching in case that is causing some problems with the trigeminal  neuralgia  We discussed her medications and the reasoning behind her medications.  Based on her last cholesterol labs in the past year and her last several visits and blood pressure readings, her medications seem to be working  I advised that I could change some of her medicines to combination medicine such as Caduet which would combine amlodipine  and Lipitor as a replacement for separate amlodipine  and Crestor  which she currently takes  We could also consider combo amlodipine  and ARB is a different option  I recommended she work on losing weight as 15 to 20 pound weight loss over the next several months could reduce the need for as many blood pressure medications.  [name] was seen today for hospitalization follow-up.  Diagnoses and all orders for this visit:  Trigeminal neuralgia  Hypertension, unspecified type  Dyslipidemia associated with type 2 diabetes mellitus (HCC)  Type 2 diabetes mellitus with hyperglycemia, without long-term current use of insulin (HCC) -     rosuvastatin  (CRESTOR ) 40 MG tablet; TAKE 1 TABLET(40 MG) BY MOUTH AT BEDTIME  Atherosclerosis of native coronary artery of native heart without angina pectoris -     rosuvastatin  (CRESTOR ) 40 MG tablet; TAKE 1 TABLET(40 MG) BY MOUTH AT BEDTIME  Mixed hyperlipidemia -     rosuvastatin  (CRESTOR ) 40 MG tablet; TAKE 1 TABLET(40 MG) BY MOUTH AT BEDTIME  Benign hypertension -     olmesartan  (BENICAR ) 40 MG tablet; Take 1 tablet (40 mg total) by mouth every evening.  Essential hypertension -     carvedilol  (COREG ) 25 MG tablet; TAKE 1 TABLET(25 MG) BY MOUTH TWICE DAILY WITH A MEAL  Other orders -     baclofen  (LIORESAL ) 10 MG tablet; Take 1 tablet (10 mg total) by mouth 2 (two) times daily as needed for muscle spasms. -     carbamazepine  (CARBATROL ) 100 MG 12 hr capsule; Take 1 capsule (100 mg total) by mouth 2 (two) times daily. -     metFORMIN  (GLUCOPHAGE ) 500 MG tablet; TAKE 1 TABLET(500 MG) BY MOUTH DAILY WITH  BREAKFAST -     aspirin  EC 81 MG tablet; Take 1 tablet (81 mg total) by mouth daily. -     amLODipine  (NORVASC ) 10 MG tablet; Take 1 tablet (10 mg total) by mouth daily.    Follow up: 21mo

## 2024-01-28 ENCOUNTER — Emergency Department (HOSPITAL_BASED_OUTPATIENT_CLINIC_OR_DEPARTMENT_OTHER)
Admission: EM | Admit: 2024-01-28 | Discharge: 2024-01-28 | Disposition: A | Discharge status: Home / Self Care | Attending: Emergency Medicine | Admitting: Emergency Medicine

## 2024-01-28 ENCOUNTER — Other Ambulatory Visit: Payer: Self-pay

## 2024-01-28 ENCOUNTER — Encounter (HOSPITAL_BASED_OUTPATIENT_CLINIC_OR_DEPARTMENT_OTHER): Payer: Self-pay

## 2024-01-28 DIAGNOSIS — Z7982 Long term (current) use of aspirin: Secondary | ICD-10-CM | POA: Diagnosis not present

## 2024-01-28 DIAGNOSIS — I10 Essential (primary) hypertension: Secondary | ICD-10-CM | POA: Diagnosis not present

## 2024-01-28 DIAGNOSIS — E119 Type 2 diabetes mellitus without complications: Secondary | ICD-10-CM | POA: Diagnosis not present

## 2024-01-28 DIAGNOSIS — R519 Headache, unspecified: Secondary | ICD-10-CM | POA: Diagnosis present

## 2024-01-28 DIAGNOSIS — Z79899 Other long term (current) drug therapy: Secondary | ICD-10-CM | POA: Insufficient documentation

## 2024-01-28 DIAGNOSIS — G500 Trigeminal neuralgia: Secondary | ICD-10-CM | POA: Insufficient documentation

## 2024-01-28 DIAGNOSIS — Z7984 Long term (current) use of oral hypoglycemic drugs: Secondary | ICD-10-CM | POA: Insufficient documentation

## 2024-01-28 MED ORDER — CARBAMAZEPINE ER 200 MG PO TB12: 200.0000 mg | ORAL_TABLET | Freq: Two times a day (BID) | ORAL | 0 refills | Status: DC

## 2024-01-28 MED ORDER — CARBAMAZEPINE 200 MG PO TABS
400.0000 mg | ORAL_TABLET | Freq: Once | ORAL | Status: AC
  Administered 2024-01-28: 400 mg via ORAL
  Filled 2024-01-28: qty 2

## 2024-01-28 NOTE — Discharge Instructions (Addendum)
 You may begin taking 200 mg of carbamazepine  once in the morning and once in the evening.  You may use the pills that you currently have until you run out of those.  I have sent your prescription for carbamazepine  to your pharmacy.  Checking on goodrx.com, that prescription should be between $20 and $35.  Please follow-up with your primary care doctor.  I recommend getting a mouthguard.  Take all of your medications as prescribed, including your blood pressure medications.

## 2024-01-28 NOTE — ED Triage Notes (Signed)
 Patient reports recurring facial nerve pain. She states that she has not taken her blood pressure medication today and she is hypertensive in triage.

## 2024-01-28 NOTE — ED Notes (Signed)
 Patient says she has her blood pressure medications and asked if she should take them. I asked Dr. Mannie and he was okay with this. Told patient to take her medications and provided her water. Will evaluate BP

## 2024-01-28 NOTE — ED Provider Notes (Signed)
 [place] EMERGENCY DEPARTMENT AT [facility] Provider Note  CSN: [id number] Arrival date & time: 01/28/24 1604  Chief Complaint(s) Facial Pain and Hypertension  HPI Penny Rice is a 68 y.o. female who is here today with left-sided facial pain.  She has a history of trigeminal neuralgia, has been inconsistent with her medications because she tells me she does not like to take that many pills.  Patient arrived, was hypertensive, had not taken her blood pressure medicines today.  She has taken 100 mg of carbamazepine  2 times daily with intermittent success.  She has been advised to wear mouthguard, does not currently have 1.   Past Medical History Past Medical History:  Diagnosis Date   Allergy    Arthritis    Coronary artery calcification    Diabetes mellitus without complication (HCC)    Facial pain    Hyperlipidemia    Hypertension    Vitamin D  deficiency    Patient Active Problem List   Diagnosis Date Noted   Osteopenia 08/22/2023   High serum parathyroid hormone (PTH) 08/22/2023   Increased PTH level 12/20/2022   Atherosclerosis 09/09/2020   Allergic rhinitis due to pollen 09/09/2020   Precordial pain    Right-sided chest wall pain 06/29/2020   Constipation 03/11/2020   Tendonitis of both elbows 03/11/2020   Right sided abdominal pain 03/11/2020   Diabetes mellitus (HCC) 02/05/2020   Dyslipidemia associated with type 2 diabetes mellitus (HCC) 02/05/2020   Screen for colon cancer 02/05/2020   Enlarged and hypertrophic nails 02/05/2020   Screening for heart disease 02/05/2020   Noncompliance 02/05/2020   Chronic left shoulder pain 01/07/2020   Left arm pain 01/07/2020   Arm paresthesia, left 01/07/2020   Joint swelling 03/01/2019   Polyarthralgia 03/01/2019   S/P hysterectomy 12/17/2018   Vaccine counseling 12/17/2018   Encounter for health maintenance examination in adult 12/17/2018   Post-menopausal 12/17/2018   Decreased hearing of both ears 05/28/2018    Need for influenza vaccination 02/09/2018   Need for pneumococcal vaccination 02/09/2018   Vitamin D  deficiency 10/25/2017   BMI 32.0-32.9,adult 07/12/2017   Hypertension 09/28/2010   Home Medication(s) Prior to Admission medications   Medication Sig Start Date End Date Taking? Authorizing Provider  carbamazepine  (TEGRETOL -XR) 200 MG 12 hr tablet Take 1 tablet (200 mg total) by mouth 2 (two) times daily. 01/28/24  Yes Mannie Pac T, DO  amLODipine  (NORVASC ) 10 MG tablet Take 1 tablet (10 mg total) by mouth daily. 01/16/24   Tysinger, Alm RAMAN, PA-C  aspirin  EC 81 MG tablet Take 1 tablet (81 mg total) by mouth daily. 01/16/24   Tysinger, Alm RAMAN, PA-C  baclofen  (LIORESAL ) 10 MG tablet Take 1 tablet (10 mg total) by mouth 2 (two) times daily as needed for muscle spasms. 01/16/24   Tysinger, Alm RAMAN, PA-C  blood glucose meter kit and supplies Dispense based on patient and insurance preference. Use up to twice a daily as directed. (FOR ICD-10 E10.9, E11.9). 04/06/21   Tysinger, Alm RAMAN, PA-C  carvedilol  (COREG ) 25 MG tablet TAKE 1 TABLET(25 MG) BY MOUTH TWICE DAILY WITH A MEAL 01/16/24   Tysinger, Alm RAMAN, PA-C  fluticasone (FLONASE) 50 MCG/ACT nasal spray Place 2 sprays into both nostrils daily. 12/04/21   [provider]  metFORMIN  (GLUCOPHAGE ) 500 MG tablet TAKE 1 TABLET(500 MG) BY MOUTH DAILY WITH BREAKFAST 01/16/24   Tysinger, Alm RAMAN, PA-C  olmesartan  (BENICAR ) 40 MG tablet Take 1 tablet (40 mg total) by mouth every evening.  01/16/24 04/15/24  Tysinger, Alm RAMAN, PA-C  rosuvastatin  (CRESTOR ) 40 MG tablet TAKE 1 TABLET(40 MG) BY MOUTH AT BEDTIME 01/16/24   Tysinger, Alm RAMAN, PA-C  Vitamin D , Ergocalciferol , (DRISDOL ) 1.25 MG (50000 UNIT) CAPS capsule Take 1 capsule (50,000 Units total) by mouth every 7 (seven) days. 04/25/23   Tysinger, Alm RAMAN, PA-C  potassium chloride  (KLOR-CON ) 10 MEQ tablet Take 1 tablet (10 mEq total) by mouth daily for 30 doses. 09/11/20 09/12/20  Cottie Donnice PARAS, MD                                                                                                                                     Past Surgical History Past Surgical History:  Procedure Laterality Date   ABDOMINAL HYSTERECTOMY     pt reports full   COLONOSCOPY  2016   Dr. Gwendlyn Buddy   COLONOSCOPY  02/18/2020   POLYPECTOMY     TOOTH EXTRACTION  2022   2 teeth pulled   TOTAL ABDOMINAL HYSTERECTOMY     per pt   Family History Family History  Problem Relation Age of Onset   Hypertension Mother    Other Father        violent death   Hypertension Sister    Diabetes Sister    Hypertension Sister    Hypertension Sister    Hypertension Sister    Hypertension Sister    Cancer Son        lung   Colon cancer Neg Hx    Heart disease Neg Hx    Stroke Neg Hx    Colon polyps Neg Hx    Rectal cancer Neg Hx    Stomach cancer Neg Hx     Social History Social History   Tobacco Use   Smoking status: Never   Smokeless tobacco: Never  Vaping Use   Vaping status: Never Used  Substance Use Topics   Alcohol use: No    Alcohol/week: 0.0 standard drinks of alcohol   Drug use: No   Allergies Patient has no known allergies.  Review of Systems Review of Systems  Physical Exam Vital Signs  I have reviewed the triage vital signs BP (!) 246/141 (BP Location: Right Arm)   Pulse 76   Temp 98.2 F (36.8 C)   Resp 17   SpO2 98%   Physical Exam Vitals and nursing note reviewed.  HENT:     Head: Normocephalic.  Eyes:     Pupils: Pupils are equal, round, and reactive to light.  Cardiovascular:     Rate and Rhythm: Normal rate.  Pulmonary:     Effort: Pulmonary effort is normal.  Abdominal:     General: Abdomen is flat.  Musculoskeletal:        General: Normal range of motion.  Skin:    General: Skin is warm and dry.  Neurological:     General: No focal deficit present.  Mental Status: She is alert and oriented to person, place, and time.     Cranial Nerves: No cranial  nerve deficit.     Motor: No weakness.     Gait: Gait normal.     ED Results and Treatments Labs (all labs ordered are listed, but only abnormal results are displayed) Labs Reviewed - No data to display                                                                                                                        Radiology No results found.  Pertinent labs & imaging results that were available during my care of the patient were reviewed by me and considered in my medical decision making (see MDM for details).  Medications Ordered in ED Medications  carbamazepine  (TEGRETOL ) tablet 400 mg (has no administration in time range)                                                                                                                                     Procedures Procedures  (including critical care time)  Medical Decision Making / ED Course   This patient presents to the ED for concern of facial pain, this involves an extensive number of treatment options, and is a complaint that carries with it a high risk of complications and morbidity.  The differential diagnosis includes trigeminal neuralgia, less likely dental infection, less likely shingles.  MDM: Patient reassuring exam.  Her symptoms are consistent with trigeminal neuralgia.  My concern is that she has not taken her medications as instructed, as evidenced by her hypertension here today.  Patient without any concerning symptoms for hypertension.  She took her medicines here.  Do not believe she requires labs.  I do believe patient requires an increase in her carbamazepine , counseled the patient on the importance of taking this medicine as prescribed.  She will follow-up with her PCP.   Additional history obtained: -Additional history obtained from  -External records from outside source obtained and reviewed including: Chart review including previous notes, labs, imaging, consultation notes   Lab Tests: -I  ordered, reviewed, and interpreted labs.   The pertinent results include:   Labs Reviewed - No data to display   Medicines ordered and prescription drug management: Meds ordered this encounter  Medications   carbamazepine  (TEGRETOL ) tablet 400 mg   carbamazepine  (TEGRETOL -XR) 200 MG 12 hr tablet  Sig: Take 1 tablet (200 mg total) by mouth 2 (two) times daily.    Dispense:  60 tablet    Refill:  0    -I have reviewed the patients home medicines and have made adjustments as needed   Cardiac Monitoring: The patient was maintained on a cardiac monitor.  I personally viewed and interpreted the cardiac monitored which showed an underlying rhythm of: Normal sinus rhythm  Social Determinants of Health:  Factors impacting patients care include: Lack of access to primary care   Reevaluation: After the interventions noted above, I reevaluated the patient and found that they have :improved  Co morbidities that complicate the patient evaluation  Past Medical History:  Diagnosis Date   Allergy    Arthritis    Coronary artery calcification    Diabetes mellitus without complication (HCC)    Facial pain    Hyperlipidemia    Hypertension    Vitamin D  deficiency          Final Clinical Impression(s) / ED Diagnoses Final diagnoses:  Trigeminal neuralgia of left side of face     @PCDICTATION @    Mannie Pac T, DO 01/28/24 1704

## 2024-01-31 ENCOUNTER — Ambulatory Visit (INDEPENDENT_AMBULATORY_CARE_PROVIDER_SITE_OTHER): Admitting: Medical

## 2024-01-31 VITALS — BP 120/70 | HR 60 | Temp 97.3°F | Wt 169.8 lb

## 2024-01-31 DIAGNOSIS — R42 Dizziness and giddiness: Secondary | ICD-10-CM

## 2024-01-31 DIAGNOSIS — E785 Hyperlipidemia, unspecified: Secondary | ICD-10-CM

## 2024-01-31 DIAGNOSIS — I10 Essential (primary) hypertension: Secondary | ICD-10-CM

## 2024-01-31 DIAGNOSIS — R519 Headache, unspecified: Secondary | ICD-10-CM

## 2024-01-31 DIAGNOSIS — E213 Hyperparathyroidism, unspecified: Secondary | ICD-10-CM

## 2024-01-31 DIAGNOSIS — E1169 Type 2 diabetes mellitus with other specified complication: Secondary | ICD-10-CM

## 2024-01-31 DIAGNOSIS — E1165 Type 2 diabetes mellitus with hyperglycemia: Secondary | ICD-10-CM | POA: Diagnosis not present

## 2024-01-31 NOTE — Progress Notes (Signed)
 Subjective:  Penny Rice is a 68 y.o. female who presents for Chief Complaint  Patient presents with   Acute Visit    Pain on left side of face. Has gone to ED twice now- still having pain. Since taking the 200mg  of carpezamine she has felt off balance and not working. Pain is on both top and bottom of mouth     Here for ongoing problems with trigeminal neuralgia/left facial pain.  This went to the emergency department again for ongoing pain not relieved with her current therapy.  The pain is fairly frequent and strikes in waves.  She also feels dizzy lately.  Her ultimate goal was to come off all medication.  The emergency department note said that she was not always compliant with her blood pressure medicine.  She feels like the carbamazepine  is not helping or actually making things worse  We had prescribed baclofen  last visit but she is not taking this.  No numbness, tingling, no vision change or blurred vision, no slurred speech.  No chest pain.  No palpitation.  No shortness of breath.  No pain in the lower jaw.  No pain with jaw with biting or chewing specifically but the pain comes out of the blue regardless  No other aggravating or relieving factors.    No other c/o.  Past Medical History:  Diagnosis Date   Allergy    Arthritis    Coronary artery calcification    Diabetes mellitus without complication (HCC)    Facial pain    Hyperlipidemia    Hypertension    Vitamin D  deficiency    Current Outpatient Medications on File Prior to Visit  Medication Sig Dispense Refill   amLODipine  (NORVASC ) 10 MG tablet Take 1 tablet (10 mg total) by mouth daily. 90 tablet 1   aspirin  EC 81 MG tablet Take 1 tablet (81 mg total) by mouth daily. 90 tablet 3   carbamazepine  (TEGRETOL -XR) 200 MG 12 hr tablet Take 1 tablet (200 mg total) by mouth 2 (two) times daily. 60 tablet 0   carvedilol  (COREG ) 25 MG tablet TAKE 1 TABLET(25 MG) BY MOUTH TWICE DAILY WITH A MEAL 180 tablet 1   fluticasone  (FLONASE) 50 MCG/ACT nasal spray Place 2 sprays into both nostrils daily.     metFORMIN  (GLUCOPHAGE ) 500 MG tablet TAKE 1 TABLET(500 MG) BY MOUTH DAILY WITH BREAKFAST 90 tablet 1   olmesartan  (BENICAR ) 40 MG tablet Take 1 tablet (40 mg total) by mouth every evening. 90 tablet 1   rosuvastatin  (CRESTOR ) 40 MG tablet TAKE 1 TABLET(40 MG) BY MOUTH AT BEDTIME 90 tablet 1   Vitamin D , Ergocalciferol , (DRISDOL ) 1.25 MG (50000 UNIT) CAPS capsule Take 1 capsule (50,000 Units total) by mouth every 7 (seven) days. 12 capsule 3   baclofen  (LIORESAL ) 10 MG tablet Take 1 tablet (10 mg total) by mouth 2 (two) times daily as needed for muscle spasms. (Patient not taking: Reported on 01/31/2024) 30 each 0   blood glucose meter kit and supplies Dispense based on patient and insurance preference. Use up to twice a daily as directed. (FOR ICD-10 E10.9, E11.9). 1 each 0   [DISCONTINUED] potassium chloride  (KLOR-CON ) 10 MEQ tablet Take 1 tablet (10 mEq total) by mouth daily for 30 doses. 30 tablet 0   No current facility-administered medications on file prior to visit.    The following portions of the patient's history were reviewed and updated as appropriate: allergies, current medications, past family history, past medical history, past  social history, past surgical history and problem list.  ROS Otherwise as in subjective above  Objective: BP 120/70   Pulse 60   Temp (!) 97.3 F (36.3 C)   Wt 169 lb 12.8 oz (77 kg)   BMI 29.15 kg/m   General appearance: alert, no distress, well developed, well nourished, seems fatigued, uncomfortable HEENT: normocephalic, sclerae anicteric, conjunctiva pink and moist, moderate cerumen bilaterally,, nares patent, no discharge or erythema, pharynx normal Oral cavity: MMM, no lesions Neck: supple, no lymphadenopathy, no thyromegaly, no masses, nontender, normal range of motion Heart: RRR, normal S1, S2, no murmurs Lungs: CTA bilaterally, no wheezes, rhonchi, or  rales Pulses: 2+ radial pulses, 2+ pedal pulses, normal cap refill Ext: no edema Neuro: CN II through XII intact, nonfocal exam Psych: Pleasant, answers questions appropriately    Assessment: Encounter Diagnoses  Name Primary?   Facial pain Yes   Dizziness    Parathyroid hormone excess (HCC)    Hypertension, unspecified type    Dyslipidemia associated with type 2 diabetes mellitus (HCC)    Type 2 diabetes mellitus with hyperglycemia, without long-term current use of insulin (HCC)      Plan: I reviewed her notes from her last visit with me on 01/16/2024 as well as her emergency department visit 01/28/2024.  The problem is a combination of noncompliance and also not improved on current therapy with carbamazepine .  She wants to change off of carbamazepine .  I advise she go ahead and use the baclofen  that I prescribed last visit and consider this in conjunction with carbamazepine .  Labs as below  She has a history of elevated PTH.  We will check some additional labs as below  Consider updated head scan.  Consider try to get her in urgently with neurology for updated consult  We discussed possibly trying gabapentin  instead of carbamazepine  or other options such as phenytoin or baclofen   We will request prior endocrinology records  Penny Rice was seen today for acute visit.  Diagnoses and all orders for this visit:  Facial pain  Dizziness -     CBC -     Basic metabolic panel with GFR -     TSH  Parathyroid hormone excess (HCC) -     PTH, Intact and Calcium  -     Prolactin -     TSH  Hypertension, unspecified type  Dyslipidemia associated with type 2 diabetes mellitus (HCC)  Type 2 diabetes mellitus with hyperglycemia, without long-term current use of insulin (HCC) -     Hemoglobin A1c    Follow up: pending labs

## 2024-02-01 ENCOUNTER — Ambulatory Visit: Payer: Self-pay | Admitting: *Deleted

## 2024-02-01 ENCOUNTER — Encounter: Payer: Self-pay | Admitting: Medical

## 2024-02-01 ENCOUNTER — Ambulatory Visit: Payer: Self-pay | Admitting: Medical

## 2024-02-01 ENCOUNTER — Other Ambulatory Visit: Payer: Self-pay | Admitting: Medical

## 2024-02-01 DIAGNOSIS — R42 Dizziness and giddiness: Secondary | ICD-10-CM

## 2024-02-01 DIAGNOSIS — R7989 Other specified abnormal findings of blood chemistry: Secondary | ICD-10-CM

## 2024-02-01 DIAGNOSIS — G500 Trigeminal neuralgia: Secondary | ICD-10-CM

## 2024-02-01 LAB — BASIC METABOLIC PANEL WITH GFR
BUN/Creatinine Ratio: 16 (ref 12–28)
BUN: 12 mg/dL (ref 8–27)
CO2: 21 mmol/L (ref 20–29)
Calcium: 9.1 mg/dL (ref 8.7–10.3)
Chloride: 105 mmol/L (ref 96–106)
Creatinine, Ser: 0.77 mg/dL (ref 0.57–1.00)
Glucose: 100 mg/dL — AB (ref 70–99)
Potassium: 4.2 mmol/L (ref 3.5–5.2)
Sodium: 140 mmol/L (ref 134–144)
eGFR: 85 mL/min/1.73 (ref >59)

## 2024-02-01 LAB — HEMOGLOBIN A1C
Est. average glucose Bld gHb Est-mCnc: 140 mg/dL
Hgb A1c MFr Bld: 6.5 % — ABNORMAL HIGH (ref 4.8–5.6)

## 2024-02-01 LAB — CBC
Hematocrit: 43.6 % (ref 34.0–46.6)
Hemoglobin: 13.9 g/dL (ref 11.1–15.9)
MCH: 29.8 pg (ref 26.6–33.0)
MCHC: 31.9 g/dL (ref 31.5–35.7)
MCV: 94 fL (ref 79–97)
Platelets: 226 x10E3/uL (ref 150–450)
RBC: 4.66 x10E6/uL (ref 3.77–5.28)
RDW: 12.8 % (ref 11.7–15.4)
WBC: 8.6 x10E3/uL (ref 3.4–10.8)

## 2024-02-01 LAB — PTH, INTACT AND CALCIUM: PTH: 107 pg/mL — AB (ref 15–65)

## 2024-02-01 LAB — TSH: TSH: 0.253 u[IU]/mL — AB (ref 0.450–4.500)

## 2024-02-01 LAB — PROLACTIN: Prolactin: 34.2 ng/mL — AB (ref 3.6–25.2)

## 2024-02-01 NOTE — Telephone Encounter (Signed)
 Spoke with Delayne (pt's sister)    Pt has not been able to work due to symptoms and being dizzy and unable to drive. She has been out of work since Monday. What does she need to do about a work note.   Pt was advised about getting lab work added on and getting a MRI done.      Copied from CRM [id number]. Topic: Clinical - Lab/Test Results >> Feb 01, 2024 11:04 AM Treva T wrote: Reason for CRM: Received call from patient, requesting to speak with nurse of provider regarding lab results she has viewed via mychart.  Patient reports she has concerns she would like to discuss.  Patient requests to call sister, Delayne [facility] verified), as she will be with her. Can be reached at [phone] to discuss further.   Patient is aware of same day call back.   Thank you

## 2024-02-01 NOTE — Progress Notes (Signed)
 Please have the lab add a free T4  Please call [facility] to see how we order her MRI brain.  I want to order an MRI brain with special focus on the pituitary.  I do not see this as an option when I pull up order options.  How do they want us  to order this.  Her issues are trigeminal neuralgia pain, elevated prolactin and abnormal thyroid  labs, dizziness, headache.  Results:  Your prolactin level was a little elevated.  Diabetes marker stable at 6.5%.  Electrolytes okay.  Thyroid  lab was abnormal  We are going to have a different thyroid  lab and also set up for brain MRI  Did she have any improvement with the baclofen  last night?  Either have her continue the carbamazepine  200 mg as prescribed along with baclofen , or if she would like to try switching to gabapentin  instead of carbamazepine  for the time being, I can send that in.  The gabapentin  dosing would be 300 mg twice daily

## 2024-02-01 NOTE — Telephone Encounter (Signed)
 Patient forgot to mention request for medication for mouth pain during OV yesterday . Please advise.

## 2024-02-01 NOTE — Telephone Encounter (Signed)
 FYI Only or Action Required?: Action required by provider: update on patient condition and requesting medication/ cream for mouth pain .  Patient was last seen in primary care on 01/31/2024 by Bulah Alm RAMAN, PA-C.  Called Nurse Triage reporting Dental Pain.  Symptoms began yesterday.  Interventions attempted: Prescription medications: baclofen .  Symptoms are: gradually worsening.  Triage Disposition: See PCP When Office is Open (Within 3 Days)  Patient/caregiver understands and will follow disposition?: No, wishes to speak with PCP   Patient forgot to mention request for medication for mouth pain during OV yesterday . Please advise.         Copied from CRM #[id number]. Topic: Clinical - Red Word Triage >> Feb 01, 2024  3:47 PM Charlet HERO wrote: Red Word that prompted transfer to Nurse Triage: Patient is calling about her mouth is hurting she is stating that the left side and she was given a muscle relaxer. and other med for pain but states that it still hurts and would like a cream to apply. Reason for Disposition  [1] MILD-MODERATE mouth pain AND [2] present > 3 days  Answer Assessment - Initial Assessment Questions Requesting medication or cream to apply to left side of jaw or mouth area. Reports pain upper and lower jaw area and gums. Baclofen  not helping with mouth pain.     1. ONSET: When did your mouth start hurting? (e.g., hours or days ago)      On going and patient forgot to tell PCP yesterday  2. SEVERITY: How bad is the pain? (Scale 1-10; or mild, moderate or severe)     7-8/10 3. SORES: Are there any sores or ulcers in the mouth? If Yes, ask: What part of the mouth are the sores in?     Gums are sore and pain in left upper and lower jaw area 4. FEVER: Do you have a fever? If Yes, ask: What is your temperature, how was it measured, and when did it start?     No  5. CAUSE: What do you think is causing the mouth pain?     Not sure . Does not have  teeth in left mouth area 6. OTHER SYMPTOMS: Do you have any other symptoms? (e.g., difficulty breathing)     no  Protocols used: Mouth Pain-A-AH

## 2024-02-01 NOTE — Progress Notes (Signed)
 Parathyroid hormone also abnormal.  Continue plan to have MRI soon.  Also please check on the status of the endocrinology referral.  We need to talk to referrals about changing this to STAT now since she has abnormalities that are new

## 2024-02-02 ENCOUNTER — Other Ambulatory Visit: Payer: Self-pay | Admitting: Medical

## 2024-02-02 NOTE — Telephone Encounter (Signed)
 Patient was advised to reach out to endocrinology to follow-up as well as Neurology about symptoms.  Patient's MRI is scheduled for 02/20/24

## 2024-02-03 ENCOUNTER — Other Ambulatory Visit: Payer: Self-pay

## 2024-02-03 ENCOUNTER — Emergency Department (HOSPITAL_COMMUNITY)

## 2024-02-03 ENCOUNTER — Emergency Department (HOSPITAL_COMMUNITY)
Admission: EM | Admit: 2024-02-03 | Discharge: 2024-02-03 | Disposition: A | Discharge status: Home / Self Care | Attending: Emergency Medicine | Admitting: Emergency Medicine

## 2024-02-03 ENCOUNTER — Encounter (HOSPITAL_COMMUNITY): Payer: Self-pay | Admitting: Emergency Medicine

## 2024-02-03 DIAGNOSIS — I10 Essential (primary) hypertension: Secondary | ICD-10-CM | POA: Diagnosis not present

## 2024-02-03 DIAGNOSIS — Z7982 Long term (current) use of aspirin: Secondary | ICD-10-CM | POA: Insufficient documentation

## 2024-02-03 DIAGNOSIS — N3000 Acute cystitis without hematuria: Secondary | ICD-10-CM | POA: Insufficient documentation

## 2024-02-03 DIAGNOSIS — E119 Type 2 diabetes mellitus without complications: Secondary | ICD-10-CM | POA: Diagnosis not present

## 2024-02-03 DIAGNOSIS — Z7984 Long term (current) use of oral hypoglycemic drugs: Secondary | ICD-10-CM | POA: Insufficient documentation

## 2024-02-03 DIAGNOSIS — Z79899 Other long term (current) drug therapy: Secondary | ICD-10-CM | POA: Diagnosis not present

## 2024-02-03 DIAGNOSIS — G500 Trigeminal neuralgia: Secondary | ICD-10-CM | POA: Diagnosis not present

## 2024-02-03 DIAGNOSIS — R9082 White matter disease, unspecified: Secondary | ICD-10-CM | POA: Diagnosis not present

## 2024-02-03 DIAGNOSIS — R519 Headache, unspecified: Secondary | ICD-10-CM | POA: Diagnosis not present

## 2024-02-03 LAB — COMPREHENSIVE METABOLIC PANEL WITH GFR
ALT: 16 U/L (ref 0–44)
AST: 20 U/L (ref 15–41)
Albumin: 3.8 g/dL (ref 3.5–5.0)
Alkaline Phosphatase: 78 U/L (ref 38–126)
Anion gap: 14 (ref 5–15)
BUN: 14 mg/dL (ref 8–23)
CO2: 23 mmol/L (ref 22–32)
Calcium: 10.1 mg/dL (ref 8.9–10.3)
Chloride: 101 mmol/L (ref 98–111)
Creatinine, Ser: 0.97 mg/dL (ref 0.44–1.00)
GFR, Estimated: >60 mL/min (ref >60)
Glucose, Bld: 108 mg/dL — ABNORMAL HIGH (ref 70–99)
Potassium: 3.7 mmol/L (ref 3.5–5.1)
Sodium: 138 mmol/L (ref 135–145)
Total Bilirubin: 0.6 mg/dL (ref 0.0–1.2)
Total Protein: 7 g/dL (ref 6.5–8.1)

## 2024-02-03 LAB — URINALYSIS, ROUTINE W REFLEX MICROSCOPIC
Bilirubin Urine: NEGATIVE
Glucose, UA: NEGATIVE mg/dL
Ketones, ur: 20 mg/dL — AB
Nitrite: NEGATIVE
Protein, ur: 30 mg/dL — AB
Specific Gravity, Urine: 1.027 (ref 1.005–1.030)
pH: 5 (ref 5.0–8.0)

## 2024-02-03 LAB — CBC
HCT: 44.7 % (ref 36.0–46.0)
Hemoglobin: 14.9 g/dL (ref 12.0–15.0)
MCH: 29.7 pg (ref 26.0–34.0)
MCHC: 33.3 g/dL (ref 30.0–36.0)
MCV: 89 fL (ref 80.0–100.0)
Platelets: 228 K/uL (ref 150–400)
RBC: 5.02 MIL/uL (ref 3.87–5.11)
RDW: 12.4 % (ref 11.5–15.5)
WBC: 7.7 K/uL (ref 4.0–10.5)
nRBC: 0 % (ref 0.0–0.2)

## 2024-02-03 LAB — CBG MONITORING, ED: Glucose-Capillary: 108 mg/dL — ABNORMAL HIGH (ref 70–99)

## 2024-02-03 MED ORDER — MORPHINE SULFATE (PF) 4 MG/ML IV SOLN
4.0000 mg | Freq: Once | INTRAVENOUS | Status: AC
  Administered 2024-02-03: 4 mg via INTRAVENOUS
  Filled 2024-02-03: qty 1

## 2024-02-03 MED ORDER — CEFADROXIL 500 MG PO CAPS: 500.0000 mg | ORAL_CAPSULE | Freq: Two times a day (BID) | ORAL | 0 refills | Status: DC

## 2024-02-03 MED ORDER — GADOBUTROL 1 MMOL/ML IV SOLN
7.0000 mL | Freq: Once | INTRAVENOUS | Status: AC | PRN
  Administered 2024-02-03: 7 mL via INTRAVENOUS

## 2024-02-03 MED ORDER — KETOROLAC TROMETHAMINE 15 MG/ML IJ SOLN
15.0000 mg | Freq: Once | INTRAMUSCULAR | Status: AC
  Administered 2024-02-03: 15 mg via INTRAVENOUS
  Filled 2024-02-03: qty 1

## 2024-02-03 MED ORDER — CARBAMAZEPINE 200 MG PO TABS: ORAL_TABLET | ORAL | 0 refills | Status: DC

## 2024-02-03 NOTE — ED Provider Notes (Signed)
 [place] [facility] Provider Note   CSN: [id number] Arrival date & time: 02/03/24  9166     Patient presents with: Facial Pain   Penny Rice is a 68 y.o. female with past medical history seen for hypertension, hyperlipidemia, polyarthralgia, diabetes who presents concern for left-sided facial pain, history of trigeminal neuralgia.  Pain started a few weeks ago, today rates it 9/10.  Has not yet followed up with neurologist.  She has been taking carbamazepine  without significant relief.  Blood pressure elevated, she reports she has not taken her home medications.  She denies any chest pain, shortness of breath.   HPI     Prior to Admission medications   Medication Sig Start Date End Date Taking? Authorizing Provider  carbamazepine  (TEGRETOL ) 200 MG tablet Take two tablets by mouth twice daily (total dose 400mg  twice daily) 02/03/24  Yes Brynnley Dayrit H, PA-C  cefadroxil  (DURICEF) 500 MG capsule Take 1 capsule (500 mg total) by mouth 2 (two) times daily. 02/03/24  Yes Nollie Terlizzi H, PA-C  amLODipine  (NORVASC ) 10 MG tablet Take 1 tablet (10 mg total) by mouth daily. 01/16/24   Tysinger, Alm RAMAN, PA-C  aspirin  EC 81 MG tablet Take 1 tablet (81 mg total) by mouth daily. 01/16/24   Tysinger, Alm RAMAN, PA-C  baclofen  (LIORESAL ) 10 MG tablet Take 1 tablet (10 mg total) by mouth 2 (two) times daily as needed for muscle spasms. Patient not taking: Reported on 01/31/2024 01/16/24   Tysinger, Alm RAMAN, PA-C  blood glucose meter kit and supplies Dispense based on patient and insurance preference. Use up to twice a daily as directed. (FOR ICD-10 E10.9, E11.9). 04/06/21   Tysinger, Alm RAMAN, PA-C  carvedilol  (COREG ) 25 MG tablet TAKE 1 TABLET(25 MG) BY MOUTH TWICE DAILY WITH A MEAL 01/16/24   Tysinger, Alm RAMAN, PA-C  fluticasone (FLONASE) 50 MCG/ACT nasal spray Place 2 sprays into both nostrils daily. 12/04/21   [provider]  metFORMIN  (GLUCOPHAGE ) 500  MG tablet TAKE 1 TABLET(500 MG) BY MOUTH DAILY WITH BREAKFAST 01/16/24   Tysinger, Alm RAMAN, PA-C  olmesartan  (BENICAR ) 40 MG tablet Take 1 tablet (40 mg total) by mouth every evening. 01/16/24 04/15/24  Tysinger, Alm RAMAN, PA-C  rosuvastatin  (CRESTOR ) 40 MG tablet TAKE 1 TABLET(40 MG) BY MOUTH AT BEDTIME 01/16/24   Tysinger, Alm RAMAN, PA-C  Vitamin D , Ergocalciferol , (DRISDOL ) 1.25 MG (50000 UNIT) CAPS capsule Take 1 capsule (50,000 Units total) by mouth every 7 (seven) days. 04/25/23   Tysinger, Alm RAMAN, PA-C  potassium chloride  (KLOR-CON ) 10 MEQ tablet Take 1 tablet (10 mEq total) by mouth daily for 30 doses. 09/11/20 09/12/20  Cottie Donnice PARAS, MD    Allergies: Patient has no known allergies.    Review of Systems  All other systems reviewed and are negative.   Updated Vital Signs BP (!) 158/99   Pulse 72   Temp 98.6 F (37 C)   Resp 16   Wt 77 kg   SpO2 100%   BMI 29.14 kg/m   Physical Exam Vitals and nursing note reviewed.  Constitutional:      General: She is not in acute distress.    Appearance: Normal appearance.  HENT:     Head: Normocephalic and atraumatic.  Eyes:     General:        Right eye: No discharge.        Left eye: No discharge.  Cardiovascular:     Rate and Rhythm:  Normal rate and regular rhythm.     Heart sounds: No murmur heard.    No friction rub. No gallop.  Pulmonary:     Effort: Pulmonary effort is normal.     Breath sounds: Normal breath sounds.  Abdominal:     General: Bowel sounds are normal.     Palpations: Abdomen is soft.  Skin:    General: Skin is warm and dry.     Capillary Refill: Capillary refill takes less than 2 seconds.  Neurological:     Mental Status: She is alert and oriented to person, place, and time.     Comments: Moves all 4 limbs spontaneously, CN II through XII grossly intact, can ambulate without difficulty, intact sensation throughout.   Psychiatric:        Mood and Affect: Mood normal.        Behavior: Behavior normal.      (all labs ordered are listed, but only abnormal results are displayed) Labs Reviewed  COMPREHENSIVE METABOLIC PANEL WITH GFR - Abnormal; Notable for the following components:      Result Value   Glucose, Bld 108 (*)    All other components within normal limits  URINALYSIS, ROUTINE W REFLEX MICROSCOPIC - Abnormal; Notable for the following components:   Color, Urine AMBER (*)    APPearance CLOUDY (*)    Hgb urine dipstick SMALL (*)    Ketones, ur 20 (*)    Protein, ur 30 (*)    Leukocytes,Ua MODERATE (*)    Bacteria, UA FEW (*)    All other components within normal limits  CBG MONITORING, ED - Abnormal; Notable for the following components:   Glucose-Capillary 108 (*)    All other components within normal limits  URINE CULTURE  CBC    EKG: None  Radiology: MR Brain W and Wo Contrast Result Date: 02/03/2024 CLINICAL DATA:  68 year old female with increasing headache, left facial pain. taking carbamazepine  for trigeminal neuralgia. EXAM: MRI HEAD WITHOUT AND WITH CONTRAST TECHNIQUE: Multiplanar, multiecho pulse sequences of the brain and surrounding structures were obtained without and with intravenous contrast. CONTRAST:  7mL GADAVIST  GADOBUTROL  1 MMOL/ML IV SOLN COMPARISON:  Head CT today.  Brain MRI 10/12/2021. FINDINGS: Brain: Cerebral volume is stable and normal for age. No restricted diffusion to suggest acute infarction. No midline shift, mass effect, evidence of mass lesion, ventriculomegaly, extra-axial collection or acute intracranial hemorrhage. Cervicomedullary junction and pituitary are within normal limits. Chronic bilateral Patchy and confluent cerebral white matter T2 and FLAIR hyperintensity. This is chronically age advanced. Mostly increased perivascular spaces in the bilateral deep gray nuclei but suggestion of a chronic lacunar infarct in the left lentiform on series 2, image 20. No superimposed cortical encephalomalacia or chronic cerebral blood products. Thalami,  brainstem, and cerebellum appear normal. No abnormal enhancement identified.  No dural thickening. Visible trigeminal nerve segments (such as cisternal segments on series 9, image 16), bilateral Meckel's cave, bilateral cavernous sinus, bilateral V2 and V3 nerve trunks have an unremarkable appearance on routine MRI. Vascular: Major intracranial vascular flow voids are preserved. Following contrast the major dural venous sinuses are enhancing and appear to be patent. Skull and upper cervical spine: Chronic cervical spine disc and endplate degeneration. Normal background bone marrow signal. Evidence of at least mild degenerative cervical spinal stenosis at C3-C4 and/or C4-C5 (series 5 image 14). Sinuses/Orbits: Stable and negative. Other: Mastoids are clear. Visible internal auditory structures appear normal. Visible scalp and face appear symmetric, negative. IMPRESSION: 1. No acute intracranial  abnormality. 2. 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 3. Chronic cervical spine degeneration C3 through C5 with suspected degenerative cervical spinal stenosis. Electronically Signed   By: VEAR Hurst M.D.   On: 02/03/2024 12:52   CT Head Wo Contrast Result Date: 02/03/2024 CLINICAL DATA:  68 year old female with increasing headache, left facial pain. EXAM: CT HEAD WITHOUT CONTRAST TECHNIQUE: Contiguous axial images were obtained from the base of the skull through the vertex without intravenous contrast. RADIATION DOSE REDUCTION: This exam was performed according to the departmental dose-optimization program which includes automated exposure control, adjustment of the mA and/or kV according to patient size and/or use of iterative reconstruction technique. COMPARISON:  Brain MRI 10/12/2021.  Head CT 02/18/2016. FINDINGS: Brain: Cerebral volume remains normal for age. No midline shift, ventriculomegaly, mass effect, evidence of mass lesion, intracranial  hemorrhage or evidence of cortically based acute infarction. Patchy, scattered, confluent moderate for age cerebral white matter hypodensity appears stable from MRI appearance in 2023. No cortical encephalomalacia identified. Basilar cisterns appear normal. Normal CT appearance of bilateral Meckel's cave. Vascular: No suspicious intracranial vascular hyperdensity. Calcified atherosclerosis at the skull base. Skull: Stable and intact. Sinuses/Orbits: Visualized paranasal sinuses and mastoids are stable and well aerated. Other: Visualized orbits and scalp soft tissues are within normal limits. IMPRESSION: 1. No acute intracranial abnormality. 2. Stable moderate for age cerebral white matter disease since a 2023 MRI. Electronically Signed   By: VEAR Hurst M.D.   On: 02/03/2024 10:51     Procedures   Medications Ordered in the ED  morphine  (PF) 4 MG/ML injection 4 mg (4 mg Intravenous Given 02/03/24 1138)  ketorolac  (TORADOL ) 15 MG/ML injection 15 mg (15 mg Intravenous Given 02/03/24 1137)  gadobutrol  (GADAVIST ) 1 MMOL/ML injection 7 mL (7 mLs Intravenous Contrast Given 02/03/24 1240)                                    Medical Decision Making Amount and/or Complexity of Data Reviewed Labs: ordered. Radiology: ordered.  Risk Prescription drug management.   This patient is a 68 y.o. female  who presents to the ED for concern of headache, trigeminal neuralgia.   Differential diagnoses prior to evaluation: The emergent differential diagnosis includes, but is not limited to,  Stroke, increased ICP, meningitis, CVA, intracranial tumor, venous sinus thrombosis, migraine, cluster headache, hypertension, drug related, head injury, tension headache, sinusitis, dental abscess, otitis media, TMJ, trigeminal neuralgia. Most worried about ongoing trigeminal neuralgia. This is not an exhaustive differential.   Past Medical History / Co-morbidities / Social History: hypertension, hyperlipidemia, polyarthralgia,  diabetes  Additional history: Chart reviewed. Pertinent results include: Reviewed recent emergency department visit for trigeminal neuralgia.  Physical Exam: Physical exam performed. The pertinent findings include: Moves all 4 limbs spontaneously, CN II through XII grossly intact, can ambulate without difficulty, intact sensation throughout.  Initially quite hypertensive, blood pressure 210/130.  She took her home blood pressure medications during her emergency department evaluation, blood pressure at discharge 158/99.  Continues having no chest pain, no shortness of breath, low clinical concern for hypertensive urgency/emergency.  Lab Tests/Imaging studies: I personally interpreted labs/imaging and the pertinent results include: CBC unremarkable, CMP overall unremarkable, UA with moderate leukocytes, 21-50 red blood cells, 6-10 white blood cells and few bacteria.  There are some squamous cells, however concerning for possible developing urinary tract infection, will treat, and  send for culture.  [place] interpreted CT head, MR brain with and without contrast which shows no evidence of structural intracranial abnormality to explain her trigeminal neuralgia, simple trigeminal neuralgia presumed.I agree with the radiologist interpretation.  Cardiac monitoring: EKG obtained and interpreted by myself and attending physician which shows: Normal sinus rhythm, nonspecific T wave changes   Medications: I ordered medication including morphine , Toradol  for pain, after reviewing her symptoms, will increase her dose of carbamazepine , continue to encourage close neurology follow-up.  Discussed extensive return precautions..  I have reviewed the patients home medicines and have made adjustments as needed.   Disposition: After consideration of the diagnostic results and the patients response to treatment, I feel that patient stable for discharge, symptoms consistent with ongoing trigeminal neuralgia.   emergency  department workup does not suggest an emergent condition requiring admission or immediate intervention beyond what has been performed at this time. The plan is: as above. The patient is safe for discharge and has been instructed to return immediately for worsening symptoms, change in symptoms or any other concerns.   Final diagnoses:  Trigeminal neuralgia of left side of face  Hypertension, unspecified type  Acute cystitis without hematuria    ED Discharge Orders          Ordered    carbamazepine  (TEGRETOL ) 200 MG tablet        02/03/24 1336    cefadroxil  (DURICEF) 500 MG capsule  2 times daily        02/03/24 1336               Mehek Grega, [place], PA-C 02/03/24 1410    Yolande Lamar BROCKS, MD 02/05/24 1231

## 2024-02-03 NOTE — Discharge Instructions (Signed)
 Increase the dose of your carbamazepine  to 400 mg twice daily.  You can finish the prescription that you already have by taking 2 pills instead of 1 twice daily.  Please take the entire course of antibiotics that prescribed.  Please follow-up with neurology as planned and with your primary care doctor.  If the antibiotics that I prescribed for urinary tract infection do not cover the bacteria that grow from your culture you will receive a call from our pharmacist to change the antibiotic.

## 2024-02-03 NOTE — ED Notes (Signed)
 Patient transported to CT

## 2024-02-03 NOTE — ED Triage Notes (Addendum)
 Patient presents from home for L facial pain for which she has been previously seen in ED. H/o trigeminal neuralgia.  Pain started a few weeks ago. Pain 9/10. Patient has not yet seen neurologist. Has been taking carbamazepine  with limited relief..   Arrives ambulatory, breathing unlabored. BP elevated, states that she has not taken her AM medication, has been having trouble managing her BP.

## 2024-02-03 NOTE — ED Triage Notes (Signed)
 Pt also c/o weakness, states she has not fallen, but felt like she could. Pt states pain in left side of face has gotten worse.

## 2024-02-05 LAB — URINE CULTURE

## 2024-02-06 ENCOUNTER — Other Ambulatory Visit: Payer: Self-pay | Admitting: Internal Medicine

## 2024-02-06 DIAGNOSIS — R7989 Other specified abnormal findings of blood chemistry: Secondary | ICD-10-CM

## 2024-02-06 DIAGNOSIS — E213 Hyperparathyroidism, unspecified: Secondary | ICD-10-CM

## 2024-02-08 DIAGNOSIS — I2510 Atherosclerotic heart disease of native coronary artery without angina pectoris: Secondary | ICD-10-CM | POA: Diagnosis not present

## 2024-02-08 DIAGNOSIS — I10 Essential (primary) hypertension: Secondary | ICD-10-CM | POA: Diagnosis not present

## 2024-02-08 DIAGNOSIS — Z7982 Long term (current) use of aspirin: Secondary | ICD-10-CM | POA: Diagnosis not present

## 2024-02-08 DIAGNOSIS — G500 Trigeminal neuralgia: Secondary | ICD-10-CM | POA: Diagnosis not present

## 2024-02-08 DIAGNOSIS — N179 Acute kidney failure, unspecified: Secondary | ICD-10-CM | POA: Diagnosis not present

## 2024-02-08 DIAGNOSIS — R519 Headache, unspecified: Secondary | ICD-10-CM | POA: Diagnosis not present

## 2024-02-08 DIAGNOSIS — Z91148 Patient's other noncompliance with medication regimen for other reason: Secondary | ICD-10-CM | POA: Diagnosis not present

## 2024-02-08 DIAGNOSIS — I6782 Cerebral ischemia: Secondary | ICD-10-CM | POA: Diagnosis not present

## 2024-02-08 DIAGNOSIS — E119 Type 2 diabetes mellitus without complications: Secondary | ICD-10-CM | POA: Diagnosis not present

## 2024-02-08 DIAGNOSIS — I674 Hypertensive encephalopathy: Secondary | ICD-10-CM | POA: Diagnosis not present

## 2024-02-08 DIAGNOSIS — E785 Hyperlipidemia, unspecified: Secondary | ICD-10-CM | POA: Diagnosis not present

## 2024-02-09 DIAGNOSIS — R519 Headache, unspecified: Secondary | ICD-10-CM | POA: Diagnosis not present

## 2024-02-11 DIAGNOSIS — G500 Trigeminal neuralgia: Secondary | ICD-10-CM | POA: Diagnosis not present

## 2024-02-12 DIAGNOSIS — G500 Trigeminal neuralgia: Secondary | ICD-10-CM | POA: Diagnosis not present

## 2024-02-13 ENCOUNTER — Inpatient Hospital Stay: Admitting: Medical

## 2024-02-13 DIAGNOSIS — R519 Headache, unspecified: Secondary | ICD-10-CM | POA: Diagnosis not present

## 2024-02-13 DIAGNOSIS — G500 Trigeminal neuralgia: Secondary | ICD-10-CM | POA: Diagnosis not present

## 2024-02-15 DIAGNOSIS — G500 Trigeminal neuralgia: Secondary | ICD-10-CM | POA: Diagnosis not present

## 2024-02-15 NOTE — Progress Notes (Signed)
 Case Manager Discharge Summary / Closing Note  Expected Discharge Date & Time: 02/15/2024 at 11 am to 2 pm  Discharge Plan:  Patient is discharging home with routine care and no post-acute services were indicated prior to discharge.    Post-Acute Services Coordinated: Durable Medical Equipment - Discharged on 02/15/2024 Admission date: 02/08/2024 - Discharge disposition: Home or Self Care    Service Provider Services Address Phone Fax Patient Preferred   [facility] [address] [place], [place] [address] [phone] --       DME Documentation    Flowsheet Row Most Recent Value  Agency Name [facility]  Agency Phone [phone]  Agency Fax [phone]  Contact Name Staff  Equipment Arranged Rollator walker  Delivery Date & Location To pt's room prior to dc         Transportation: Arrangements: patient arranged Service Arranged: private vehicle  Final ADT:  Final ADT Discharge Disposition: Home Based  Home Based: Home or Self Care                 Second IMM Received: Provided to patient - in person (02/14/24 1651)  Final Summary: CM confirmed pt is medically ready for dc pending PT clearance and delivery of DME.  CM confirmed pt is cleared for dc home by PT, recommending rollator walker, no further PT or DME needs, no OT needs.  CM coordinated orders and delivery of rollator walker as above.  Medical team met with pt and family, reviewed dc plan and gave instructions about medications and care.  Per medical provider, pt and family reported understanding and agreement with dc plan.  Lamarr Etter Rucks, [place] ACM-SW Case Manager [facility] [phone]   KATHRYN  [facility]

## 2024-02-16 ENCOUNTER — Telehealth: Payer: Self-pay

## 2024-02-16 DIAGNOSIS — G500 Trigeminal neuralgia: Secondary | ICD-10-CM | POA: Diagnosis not present

## 2024-02-16 NOTE — Patient Instructions (Signed)
 Visit Information  Thank you for taking time to visit with me today. Please don't hesitate to contact me if I can be of assistance to you before our next scheduled telephone appointment.  Our next appointment is by telephone on 02/23/24 at 3 pm.  Following is a copy of your care plan:   Goals Addressed             This Visit's Progress    VBCI Transitions of Care (TOC) Care Plan       Problems:  Recent Hospitalization for treatment of DMII, HTN, and Trigeminal Nerve Pain (Reason for hospitalization. Knowledge Deficit Related to Trigeminal Nerve Pain and No Hospital Follow Up Provider appointment HTN, DMII/A1c 6.5%  Goal:  Over the next 30 days, the patient will not experience hospital readmission  Interventions:  Transitions of Care: Annual Eye Exam education provided regarding need for exam Annual Foot Exam education provided regarding need for exam Durable Medical Equipment (DME) delivery confirmed Doctor Visits  - discussed the importance of doctor visits. Trigeminal Nerve Pain: Reviewed patient's symptoms.  Pain 5-6/10 and it comes and goes.  Trigeminal neuralgia, is a chronic nerve disorder that causes severe, paroxysmal (sudden and intense) pain in the face.  Patient's pain in on LEFT side of face.  Primary diagnosis on recent hospitalization.  Patient to consult neurologist.  Symptoms reviewed:  Common Symptoms:  Severe, stabbing, or electric shock-like pain in the face, typically on one side Pain that may radiate to the jaw, teeth, or forehead Pain triggered by everyday activities such as brushing teeth, washing face, or talking Facial twitching or spasms Numbness or tingling in the affected area  Diabetes Interventions: Assessed patient's understanding of A1c goal: <6.5% Provided education to patient about basic DM disease process Lab Results  Component Value Date   HGBA1C 6.5 (H) 01/31/2024    Hypertension Interventions: Last practice recorded BP readings:   BP Readings from Last 3 Encounters:  02/03/24 (!) 158/99  01/31/24 120/70  01/28/24 (!) 219/118   Most recent eGFR/CrCl:  Lab Results  Component Value Date   EGFR 85 01/31/2024    No components found for: CRCL  Evaluation of current treatment plan related to hypertension self management and patient's adherence to plan as established by provider Reviewed medications with patient and discussed importance of compliance Discussed plans with patient for ongoing care management follow up and provided patient with direct contact information for care management team Advised patient, providing education and rationale, to monitor blood pressure daily and record, calling PCP for findings outside established parameters Discussed complications of poorly controlled blood pressure such as heart disease, stroke, circulatory complications, vision complications, kidney impairment, sexual dysfunction Screening for signs and symptoms of depression related to chronic disease state  Assessed social determinant of health barriers  Patient Self Care Activities:  Attend all scheduled provider appointments Attend church or other social activities Call pharmacy for medication refills 3-7 days in advance of running out of medications Call provider office for new concerns or questions  Notify RN Care Manager of TOC call rescheduling needs Participate in Transition of Care Program/Attend TOC scheduled calls Perform all self care activities independently  Take medications as prescribed   check blood pressure daily choose a place to take my blood pressure (home, clinic or office, retail store) write blood pressure results in a log or diary learn about high blood pressure keep a blood pressure log take blood pressure log to all doctor appointments call doctor for signs and symptoms  of high blood pressure keep all doctor appointments take medications for blood pressure exactly as prescribed report new  symptoms to your doctor eat more whole grains, fruits and vegetables, lean meats and healthy fats per discussion  This will help with any constipation as well per discussion/review.   Plan:  An initial telephone outreach has been scheduled for: 02/16/24 at 3 pm with Heron Edison RN CM   Bing Edison MSN, RN RN Case Manager [facility]  VBCI-Population Health Office Hours M-F [phone] Direct Dial: [phone] Main Phone [phone]  Fax: [phone] [email or web address]           Patient verbalizes understanding of instructions and care plan provided today and agrees to view in MyChart. Active MyChart status and patient understanding of how to access instructions and care plan via MyChart confirmed with patient.     Telephone follow up appointment with care management team member scheduled for: The patient has been provided with contact information for the care management team and has been advised to call with any health related questions or concerns.   Please call the care guide team at [phone] if you need to cancel or reschedule your appointment.   Please call the Suicide and Crisis Lifeline: 988 call 1-800-273-TALK (toll free, 24 hour hotline) call 911 if you are experiencing a Mental Health or Behavioral Health Crisis or need someone to talk to.    Bing Edison MSN, RN RN Case Sales executive Health  VBCI-Population Health Office Hours M-F [phone] Direct Dial: [phone] Main Phone [phone]  Fax: [phone] [email or web address]

## 2024-02-16 NOTE — Transitions of Care (Post Inpatient/ED Visit) (Signed)
 Today's TOC FU Call Status: Today's TOC FU Call Status:: Successful TOC FU Call Completed TOC FU Call Complete Date: 02/16/24 Patient's Name and Date of Birth confirmed.  Transition Care Management Follow-up Telephone Call Discharge Facility: Other ([facility]) Name of Other ([facility]) Discharge Facility: [facility] Type of Discharge: Inpatient Admission Primary Inpatient Discharge Diagnosis:: Trigeminal Pain How have you been since you were released from the hospital?: Same Any questions or concerns?: No  Items Reviewed: Did you receive and understand the discharge instructions provided?: Yes Medications obtained,verified, and reconciled?: Yes (Medications Reviewed) Any new allergies since your discharge?: No Dietary orders reviewed?: Yes Type of Diet Ordered:: Carb modified Do you have support at home?: Yes People in Home [RPT]: sibling(s) Name of Support/Comfort Primary Source: Lenora Hoof (Sister)  [phone] (Home Phone)  Medications Reviewed Today: Medications Reviewed Today     Reviewed by Carolee Heron NOVAK, RN (Case Manager) on 02/16/24 at 1715  Med List Status: <None>   Medication Order Taking? Sig Documenting Provider Last Dose Status Informant  amLODipine  (NORVASC ) 10 MG tablet [id number] Yes Take 1 tablet (10 mg total) by mouth daily. Bulah Alm GORMAN DEVONNA  Active   aspirin  EC 81 MG tablet [id number] Yes Take 1 tablet (81 mg total) by mouth daily. Tysinger, Alm GORMAN, PA-C  Active   baclofen  (LIORESAL ) 10 MG tablet [id number] Yes TAKE 1 TABLET(10 MG) BY MOUTH TWICE DAILY AS NEEDED FOR MUSCLE SPASMS Tysinger, Alm GORMAN, PA-C  Active   blood glucose meter kit and supplies [id number] Yes Dispense based on patient and insurance preference. Use up to twice a daily as directed. (FOR ICD-10 E10.9, E11.9). Tysinger, Alm GORMAN, PA-C  Active   carbamazepine  (TEGRETOL ) 200 MG tablet [id number] Yes Take two tablets by mouth twice daily (total dose 400mg  twice daily) Prosperi,  Christian H, PA-C  Active   carvedilol  (COREG ) 25 MG tablet [id number] Yes TAKE 1 TABLET(25 MG) BY MOUTH TWICE DAILY WITH A MEAL Tysinger, Alm GORMAN, PA-C  Active   cefadroxil  (DURICEF) 500 MG capsule [id number]  Take 1 capsule (500 mg total) by mouth 2 (two) times daily. Prosperi, Christian H, PA-C  Active   fluticasone (FLONASE) 50 MCG/ACT nasal spray [id number] Yes Place 2 sprays into both nostrils daily. [provider]  Active   losartan  (COZAAR ) 100 MG tablet [id number] Yes Take 100 mg by mouth at bedtime. [provider]  Active   metFORMIN  (GLUCOPHAGE ) 500 MG tablet [id number] Yes TAKE 1 TABLET(500 MG) BY MOUTH DAILY WITH BREAKFAST Tysinger, Alm GORMAN, PA-C  Active   olmesartan  (BENICAR ) 40 MG tablet [id number]  Take 1 tablet (40 mg total) by mouth every evening.  Patient not taking: Reported on 02/16/2024   Bulah Alm GORMAN, PA-C  Active     Discontinued 09/12/20 0126 (Change in therapy)   rosuvastatin  (CRESTOR ) 40 MG tablet [id number] Yes TAKE 1 TABLET(40 MG) BY MOUTH AT BEDTIME Tysinger, Alm GORMAN, PA-C  Active   Vitamin D , Ergocalciferol , (DRISDOL ) 1.25 MG (50000 UNIT) CAPS capsule [id number]  Take 1 capsule (50,000 Units total) by mouth every 7 (seven) days. Tysinger, Alm GORMAN, PA-C  Active             Home Care and Equipment/Supplies: Were Home Health Services Ordered?: No Any new equipment or medical supplies ordered?: Yes Name of Medical supply agency?: [employer] Were you able to get the equipment/medical supplies?: Yes (Delivered in room at hospital prior to discharge) Do you have any questions related to the use of the equipment/supplies?: No  Functional Questionnaire: Do you need assistance with bathing/showering or dressing?: No Do you need assistance with meal preparation?: Yes (Sister bringing meals.) Do you need assistance with eating?: No Do you have difficulty maintaining continence: No Do you need assistance with getting out of bed/getting out of a  chair/moving?: No Do you have difficulty managing or taking your medications?: No (Reinforced taking blood pressure medications as hospital notes indicate possible non compliance with medications.)  Follow up appointments reviewed: PCP Follow-up appointment confirmed?: No (Patient wishes to call for HFU herself due to other appointments.) MD Provider Line Number:[phone] Given: No Specialist Hospital Follow-up appointment confirmed?: No (Has neurology consult while in hospital and to call to make follow up appointment.) Reason Specialist Follow-Up Not Confirmed: Patient has Specialist Provider Number and will Call for Appointment Do you need transportation to your follow-up appointment?: No Do you understand care options if your condition(s) worsen?: Yes-patient verbalized understanding  SDOH Interventions Today    Flowsheet Row Most Recent Value  SDOH Interventions   Food Insecurity Interventions Intervention Not Indicated  Housing Interventions Intervention Not Indicated  Transportation Interventions Intervention Not Indicated, Patient Resources (Friends/Family)  Utilities Interventions Intervention Not Indicated  Social Connections Interventions Intervention Not Indicated  Health Literacy Interventions Intervention Not Indicated    Goals Addressed             This Visit's Progress    [facility] Transitions of Care (TOC) Care Plan       Problems:  Recent Hospitalization for treatment of DMII, HTN, and Trigeminal Nerve Pain (Reason for hospitalization. Knowledge Deficit Related to Trigeminal Nerve Pain and No Hospital Follow Up Provider appointment HTN, D MII, and A1c 6.5%  Goal:  Over the next 30 days, the patient will not experience hospital readmission  Interventions:  Transitions of Care: Annual Eye Exam education provided regarding need for exam Annual Foot Exam education provided regarding need for exam Durable Medical Equipment (DME) delivery confirmed Doctor Visits  -  discussed the importance of doctor visits. Trigeminal Nerve Pain: Reviewed patient's symptoms.  Pain 5-6/10 and it comes and goes.  Trigeminal neuralgia, is a chronic nerve disorder that causes severe, paroxysmal (sudden and intense) pain in the face.  Patient's pain in on LEFT side of face.  Primary diagnosis on recent hospitalization.  Patient to consult neurologist.  Symptoms reviewed:  Common Symptoms:  Severe, stabbing, or electric shock-like pain in the face, typically on one side Pain that may radiate to the jaw, teeth, or forehead Pain triggered by everyday activities such as brushing teeth, washing face, or talking Facial twitching or spasms Numbness or tingling in the affected area  Diabetes Interventions: Carb level 2 diet discussed.  Assessed patient's understanding of A1c goal: <6.5% Provided education to patient about basic DM disease process Lab Results  Component Value Date   HGBA1C 6.5 (H) 01/31/2024    Hypertension Interventions: Last practice recorded BP readings:  Today: 156/90 BP Readings from Last 3 Encounters:  02/03/24 (!) 158/99  01/31/24 120/70  01/28/24 (!) 219/118   Most recent eGFR/CrCl:  Lab Results  Component Value Date   EGFR 85 01/31/2024    No components found for: CRCL  Evaluation of current treatment plan related to hypertension self management and patient's adherence to plan as established by provider Reviewed medications with patient and discussed importance of compliance Discussed plans with patient for ongoing care management follow up and provided patient with direct contact information for care management team Advised patient, providing education and rationale, to  monitor blood pressure daily and record, calling PCP for findings outside established parameters Discussed complications of poorly controlled blood pressure such as heart disease, stroke, circulatory complications, vision complications, kidney impairment, sexual  dysfunction Screening for signs and symptoms of depression related to chronic disease state  Assessed social determinant of health barriers  Patient Self Care Activities:  Attend all scheduled provider appointments Attend church or other social activities Call pharmacy for medication refills 3-7 days in advance of running out of medications Call provider office for new concerns or questions  Notify RN Care Manager of TOC call rescheduling needs Participate in Transition of Care Program/Attend TOC scheduled calls Perform all self care activities independently  Take medications as prescribed   check blood pressure daily choose a place to take my blood pressure (home, clinic or office, retail store) write blood pressure results in a log or diary learn about high blood pressure keep a blood pressure log take blood pressure log to all doctor appointments call doctor for signs and symptoms of high blood pressure keep all doctor appointments take medications for blood pressure exactly as prescribed report new symptoms to your doctor eat more whole grains, fruits and vegetables, lean meats and healthy fats per discussion  This will help with any constipation as well per discussion/review.   Plan:  An initial telephone outreach has been scheduled for: 02/16/24 at 3 pm with Heron Edison RN CM   Bing Edison MSN, RN RN Case Manager [facility]  [facility] Office Hours M-F [phone] Direct Dial: [phone] Main Phone [phone]  Fax: [phone] [email or web address]            Bing Edison MSN, RN RN Case Manager [place]  [facility] Office Hours M-F [phone] Direct Dial: [phone] Main Phone [phone]  Fax: [phone] [email or web address]

## 2024-02-19 ENCOUNTER — Ambulatory Visit: Payer: Self-pay

## 2024-02-19 NOTE — Telephone Encounter (Signed)
 FYI Only or Action Required?: FYI only for provider.  Patient was last seen in primary care on 01/31/2024 by Bulah Alm RAMAN, PA-C.  Called Nurse Triage reporting Facial Pain.  Symptoms began several weeks ago.  Interventions attempted: OTC medications: Tylenol  and Prescription medications: Duricef, Tegretol .  Symptoms are: left sided facial pain unchanged.  Triage Disposition: See Physician Within 24 Hours (overriding See HCP Within 4 Hours (Or PCP Triage))  Patient/caregiver understands and will follow disposition?: Yes           Copied from CRM [id number]. Topic: Clinical - Red Word Triage >> Feb 19, 2024  8:14 AM Myrick T wrote: [facility] that prompted transfer to Nurse Triage: patient called stated she was having achy pain on left side her face. Reason for Disposition  [1] SEVERE pain (e.g., excruciating) AND [2] not improved after 2 hours of pain medicine  Answer Assessment - Initial Assessment Questions 1. ONSET: When did the pain start? (e.g., minutes, hours, days)     2 weeks. She states the pain never went away from when she was admitted to [facility] 9/4-9/11.  2. ONSET: Does the pain come and go, or has it been constant since it started? (e.g., constant, intermittent, fleeting)     Wakes up and states she is still having the pain, not as bad during the day and also worsens at night.  3. SEVERITY: How bad is the pain? (Scale 1-10; mild, moderate or severe)     8/10, She states she took 2 extra strength Tylenols (0300 this morning). She states she is also taking the other medications as prescribed (Tegretol  and Duricef).  4. LOCATION: Where does it hurt?      Left side of face.  5. RASH: Is there any redness, rash, or swelling of your face?     Denies redness or rash. Mild swelling of left side of face, looks like it to me  6. FEVER: Do you have a fever? If Yes, ask: What is it, how was it measured, and when did it start?      No.  7. OTHER  SYMPTOMS: Do you have any other symptoms? (e.g., fever, toothache, nasal discharge, nasal congestion, clicking sensation in jaw joint)     Tingling/burning in left side of face; difficulty eating (states due to the pain). Denies difficulty swallowing or breathing, chest pain.  8. PREGNANCY: Is there any chance you are pregnant? When was your last menstrual period?     N/A.  Protocols used: Face Pain-A-AH

## 2024-02-20 ENCOUNTER — Other Ambulatory Visit

## 2024-02-20 ENCOUNTER — Ambulatory Visit (INDEPENDENT_AMBULATORY_CARE_PROVIDER_SITE_OTHER): Admitting: Medical

## 2024-02-20 VITALS — BP 126/80 | HR 64 | Wt 164.4 lb

## 2024-02-20 DIAGNOSIS — Z91199 Patient's noncompliance with other medical treatment and regimen due to unspecified reason: Secondary | ICD-10-CM | POA: Diagnosis not present

## 2024-02-20 DIAGNOSIS — R519 Headache, unspecified: Secondary | ICD-10-CM | POA: Diagnosis not present

## 2024-02-20 DIAGNOSIS — I10 Essential (primary) hypertension: Secondary | ICD-10-CM | POA: Diagnosis not present

## 2024-02-20 DIAGNOSIS — Z566 Other physical and mental strain related to work: Secondary | ICD-10-CM

## 2024-02-20 DIAGNOSIS — E785 Hyperlipidemia, unspecified: Secondary | ICD-10-CM | POA: Diagnosis not present

## 2024-02-20 DIAGNOSIS — R7989 Other specified abnormal findings of blood chemistry: Secondary | ICD-10-CM | POA: Diagnosis not present

## 2024-02-20 DIAGNOSIS — E559 Vitamin D deficiency, unspecified: Secondary | ICD-10-CM | POA: Diagnosis not present

## 2024-02-20 DIAGNOSIS — E1165 Type 2 diabetes mellitus with hyperglycemia: Secondary | ICD-10-CM | POA: Diagnosis not present

## 2024-02-20 DIAGNOSIS — E1169 Type 2 diabetes mellitus with other specified complication: Secondary | ICD-10-CM | POA: Diagnosis not present

## 2024-02-20 MED ORDER — HYDROCODONE-ACETAMINOPHEN 5-325 MG PO TABS: 1.0000 | ORAL_TABLET | Freq: Four times a day (QID) | ORAL | 0 refills | Status: DC | PRN

## 2024-02-20 NOTE — Patient Instructions (Addendum)
 Recommendations:   High blood pressure Continue carvedilol  25 mg twice daily Continue amlodipine  10 mg daily in the morning If your blood pressure is greater than 120/70, then take your olmesartan  40 mg in the evening.  This was recommended dose last year by cardiology. If your blood pressure is less than 120/70 then hold off on the olmesartan  Limit salt Reduce stress where possible   High cholesterol Continue your rosuvastatin  Crestor  40 mg daily Continue your aspirin  81 mg daily   Diabetes Continue metformin  500 mg daily Monitor your blood sugars several mornings per week to make sure that your sugars are between 80 and 130 at goal   Vitamin D  deficiency Continue vitamin D  supplement weekly   Trigeminal neuralgia pain Continue the carbamazepine  This was increased by the recent hospital visit to 300 mg twice daily  Discontinue the baclofen  if it does not seem to be helping   Do not take losartan  as you are already on olmesartan .  This was a recent change recommended by the hospital but olmesartan  is a better drug and you already have plenty of it.   Facial pain Continue the carbamazepine  as above but I am going to add a nighttime pain pill to use as needed for worse pain This nighttime pill is Norco hydrocodone .  This can make you sleepy which should help with your sleep and pain This is meant to be short-term to give you some relief as nothing else seems to be helping Follow-up with neurology as planned in November, but call them to see if you can be seen sooner or put on the waiting list

## 2024-02-20 NOTE — Progress Notes (Signed)
 Subjective:  Penny Rice is a 68 y.o. female who presents for Chief Complaint  Patient presents with   Hospitalization Follow-up    Hospital follow-up Still having left sided facial pain     Here for ongoing problems with trigeminal neuralgia/left facial pain.   She is accompanied by her sister  I saw her on August 12 and 27 2025th for the same.  She went back to the hospital and was admitted at [facility] system 02/08/24 through 02/15/2024  She continues to have facial pain basically unchanged  At her recent hospitalization carbamazepine  was increased to 300 mg twice daily.  There was concerns about compliance with her blood pressure medication  She does have an appointment tomorrow with neurology but is not until November and she cannot get in sooner  She last saw cardiology May 2024  Since her last visit here her symptoms are basically unchanged even from the hospital visit  She does report not always being compliant with her medications  No numbness, tingling, no vision change or blurred vision, no slurred speech.  No chest pain.  No palpitation.  No shortness of breath.  No pain in the lower jaw.  No pain with jaw with biting or chewing specifically but the pain comes out of the blue regardless  No other aggravating or relieving factors.    No other c/o.  Past Medical History:  Diagnosis Date   Allergy    Arthritis    Coronary artery calcification    Diabetes mellitus without complication (HCC)    Facial pain    Hyperlipidemia    Hypertension    Vitamin D  deficiency    Current Outpatient Medications on File Prior to Visit  Medication Sig Dispense Refill   amLODipine  (NORVASC ) 10 MG tablet Take 1 tablet (10 mg total) by mouth daily. 90 tablet 1   aspirin  EC 81 MG tablet Take 1 tablet (81 mg total) by mouth daily. 90 tablet 3   baclofen  (LIORESAL ) 10 MG tablet TAKE 1 TABLET(10 MG) BY MOUTH TWICE DAILY AS NEEDED FOR MUSCLE SPASMS 60 tablet 0   carbamazepine   (TEGRETOL ) 200 MG tablet Take 300 mg by mouth in the morning and at bedtime.     carvedilol  (COREG ) 25 MG tablet TAKE 1 TABLET(25 MG) BY MOUTH TWICE DAILY WITH A MEAL 180 tablet 1   losartan  (COZAAR ) 100 MG tablet Take 100 mg by mouth at bedtime.     metFORMIN  (GLUCOPHAGE ) 500 MG tablet TAKE 1 TABLET(500 MG) BY MOUTH DAILY WITH BREAKFAST 90 tablet 1   rosuvastatin  (CRESTOR ) 40 MG tablet TAKE 1 TABLET(40 MG) BY MOUTH AT BEDTIME 90 tablet 1   blood glucose meter kit and supplies Dispense based on patient and insurance preference. Use up to twice a daily as directed. (FOR ICD-10 E10.9, E11.9). 1 each 0   [DISCONTINUED] potassium chloride  (KLOR-CON ) 10 MEQ tablet Take 1 tablet (10 mEq total) by mouth daily for 30 doses. 30 tablet 0   No current facility-administered medications on file prior to visit.    The following portions of the patient's history were reviewed and updated as appropriate: allergies, current medications, past family history, past medical history, past social history, past surgical history and problem list.  ROS Otherwise as in subjective above  Objective: BP 126/80   Pulse 64   Wt 164 lb 6.4 oz (74.6 kg)   BMI 28.22 kg/m   BP Readings from Last 3 Encounters:  02/20/24 126/80  02/16/24 (!) 156/90  02/03/24 (!) 158/99   Wt Readings from Last 3 Encounters:  02/20/24 164 lb 6.4 oz (74.6 kg)  02/03/24 169 lb 12.1 oz (77 kg)  01/31/24 169 lb 12.8 oz (77 kg)    General appearance: alert, no distress, well developed, well nourished, seems fatigued, uncomfortable HEENT: normocephalic, sclerae anicteric, conjunctiva pink and moist, moderate cerumen bilaterally,, nares patent, no discharge or erythema, pharynx normal Oral cavity: MMM, no lesions Neck: supple, no lymphadenopathy, no thyromegaly, no masses, nontender, normal range of motion Heart: RRR, normal S1, S2, no murmurs Lungs: CTA bilaterally, no wheezes, rhonchi, or rales Pulses: 2+ radial pulses, 2+ pedal pulses,  normal cap refill Ext: no edema Neuro: CN II through XII intact, nonfocal exam Psych: Pleasant, answers questions appropriately    Assessment: Encounter Diagnoses  Name Primary?   Facial pain Yes   Hypertension, unspecified type    Type 2 diabetes mellitus with hyperglycemia, without long-term current use of insulin (HCC)    Dyslipidemia associated with type 2 diabetes mellitus (HCC)    Noncompliance    Vitamin D  deficiency    High serum parathyroid hormone (PTH)    Stress at work       Plan: I reviewed her hospital discharge summary from [facility].  Medicines reconciled.  During the hospitalization they felt like her facial pain had more relation to her uncontrolled high blood pressure.  We discussed the need for better compliance in general  She was on carbamazepine  200 mg twice a day but with the recent hospitalization she was increased to 300 mg twice a day.  We discussed giving it more time to work  She formerly tried baclofen  but it did not seem to help but she does have this at home that she can use as needed.  I prescribed hydrocodone  she can use as needed more in the evening for help with sleep and pain short-term until the higher dose carbamazepine  seems to be helping.  Follow-up with neurology regarding trigeminal neuralgia pain  Hypertension-there was a comment about switching from olmesartan  to losartan  at the hospital.  She actually has not been taking olmesartan  or losartan  within the past week.   We discussed her compliance and she will resume carvedilol  25 twice daily, amlodipine  10 mg twice daily and get back on Olmesartan  40 mg in the evening daily if pressures greater than 120/70.  Her blood pressure today is significantly lower than her norm.  We want to avoid hypotension as well  We discussed the need to reduce stress where possible.  Since she has been home away from work in the past week her pressure looking better and she is feeling  overall little better.  We will continue her lipid and diabetes medicines and vitamin D  supplement unchanged  I reviewed back of her her endocrinology notes from July 2025.  Given abnormal PTH and prolactin with a recommended keeping her on vitamin D  supplement longer and continue to monitor labs periodically.    Recommendations:  High blood pressure Continue carvedilol  25 mg twice daily Continue amlodipine  10 mg daily in the morning If your blood pressure is greater than 120/70, then take your olmesartan  40 mg in the evening.  This was recommended dose last year by cardiology. If your blood pressure is less than 120/70 then hold off on the olmesartan  Limit salt Reduce stress where possible   High cholesterol Continue your rosuvastatin  Crestor  40 mg daily Continue your aspirin  81 mg daily   Diabetes Continue metformin  500  mg daily Monitor your blood sugars several mornings per week to make sure that your sugars are between 80 and 130 at goal   Vitamin D  deficiency Continue vitamin D  supplement weekly   Trigeminal neuralgia pain Continue the carbamazepine  This was increased by the recent hospital visit to 300 mg twice daily  Discontinue the baclofen  if it does not seem to be helping   Do not take losartan  as you are already on olmesartan .  This was a recent change recommended by the hospital but olmesartan  is a better drug and you already have plenty of it.   Facial pain Continue the carbamazepine  as above but I am going to add a nighttime pain pill to use as needed for worse pain This nighttime pill is Norco hydrocodone .  This can make you sleepy which should help with your sleep and pain This is meant to be short-term to give you some relief as nothing else seems to be helping Follow-up with neurology as planned in November, but call them to see if you can be seen sooner or put on the waiting list    Jorryn was seen today for hospitalization follow-up.  Diagnoses  and all orders for this visit:  Facial pain  Hypertension, unspecified type  Type 2 diabetes mellitus with hyperglycemia, without long-term current use of insulin (HCC)  Dyslipidemia associated with type 2 diabetes mellitus (HCC)  Noncompliance  Vitamin D  deficiency  High serum parathyroid hormone (PTH)  Stress at work  Other orders -     HYDROcodone -acetaminophen  (NORCO/VICODIN) 5-325 MG tablet; Take 1 tablet by mouth every 6 (six) hours as needed for moderate pain (pain score 4-6).    Follow up: with neurology as scheduled

## 2024-02-21 ENCOUNTER — Telehealth: Payer: Self-pay | Admitting: Internal Medicine

## 2024-02-21 NOTE — Telephone Encounter (Signed)
 Answered her question about medication  Copied from CRM #[id number]. Topic: Clinical - Medication Question >> Feb 21, 2024 12:12 PM Antwanette L wrote: Reason for CRM: The patient has questions regarding her HYDROcodone -acetaminophen  (NORCO/VICODIN) 5-325 MG tablet prescription and is requesting a callback at [phone]

## 2024-02-23 ENCOUNTER — Other Ambulatory Visit: Payer: Self-pay

## 2024-02-23 LAB — SPECIMEN STATUS REPORT

## 2024-02-23 LAB — T4, FREE: Free T4: 1.06 ng/dL (ref 0.82–1.77)

## 2024-02-23 NOTE — Transitions of Care (Post Inpatient/ED Visit) (Signed)
 Transition of Care week 2  Visit Note  02/23/2024  Name: Penny Rice MRN: [id number]          DOB: Oct 08, 1955  Situation: Patient enrolled in [facility] 30-day program. Visit completed with patient by telephone.   Background: Trigeminal nerve pain. Medication compliance issues for blood pressures.   Initial Transition Care Management Follow-up Telephone Call    Past Medical History:  Diagnosis Date   Allergy    Arthritis    Coronary artery calcification    Diabetes mellitus without complication (HCC)    Facial pain    Hyperlipidemia    Hypertension    Vitamin D  deficiency     Assessment: Patient Reported Symptoms: Cognitive Cognitive Status: No symptoms reported, Alert and oriented to person, place, and time, Insightful and able to interpret abstract concepts, Normal speech and language skills      Neurological Neurological Review of Symptoms: No symptoms reported Oher Neurological Symptoms/Conditions [RPT]: LEFT trigeminal facial pain Neurological Management Strategies: Adequate rest, Coping strategies, Medication therapy, Routine screening Neurological Self-Management Outcome: 3 (uncertain) Neurological Comment: Pain medications modified by PCP since last TOC call and patient reports it has helped a lot.  HEENT   HEENT Comment: Facial pain due to trigeminal nerve pain.    Cardiovascular Cardiovascular Symptoms Reported: No symptoms reported Cardiovascular Comment: Compliance issues noted by DC Summary and PCP office visit. Stressed importance of taking all medications as prescribed with patient. Not a financial barrier.  Respiratory Respiratory Symptoms Reported: No symptoms reported Respiratory Self-Management Outcome: 4 (good)  Endocrine Endocrine Symptoms Reported: No symptoms reported Is patient diabetic?: Yes Is patient checking blood sugars at home?: No (Not routinely and patient cut call short.) List most recent blood sugar readings, include date and time of day:  Metformin , no insulin. Endocrine Comment: PCP recommended checking blood sugars several times a week and looking for a range between 80 and 130 per PCP notes.  Gastrointestinal Gastrointestinal Symptoms Reported: Not assessed Additional Gastrointestinal Details: Last week it was discussed and patient reported that MOM and prunes wtih butter had helped. Diet modifications were also reviewed and discussed last week. Call was cut short this week by patient. Gastrointestinal Management Strategies: Activity, Coping strategies, Diet modification, Exercise, Medication therapy Gastrointestinal Self-Management Outcome: 4 (good)    Genitourinary Genitourinary Symptoms Reported: Not assessed    Integumentary Integumentary Symptoms Reported: Not assessed    Musculoskeletal Musculoskelatal Symptoms Reviewed: No symptoms reported   Falls in the past year?: No    Psychosocial Psychosocial Symptoms Reported: No symptoms reported         There were no vitals filed for this visit.  Medications Reviewed Today     Reviewed by Carolee Heron NOVAK, RN (Case Manager) on 02/23/24 at 1414  Med List Status: <None>   Medication Order Taking? Sig Documenting Provider Last Dose Status Informant  amLODipine  (NORVASC ) 10 MG tablet [id number] Yes Take 1 tablet (10 mg total) by mouth daily. Bulah Alm GORMAN DEVONNA  Active   aspirin  EC 81 MG tablet [id number] Yes Take 1 tablet (81 mg total) by mouth daily. Bulah Alm GORMAN, PA-C  Active   baclofen  (LIORESAL ) 10 MG tablet [id number] Yes TAKE 1 TABLET(10 MG) BY MOUTH TWICE DAILY AS NEEDED FOR MUSCLE SPASMS Tysinger, Alm GORMAN, PA-C  Active   blood glucose meter kit and supplies [id number] Yes Dispense based on patient and insurance preference. Use up to twice a daily as directed. (FOR ICD-10 E10.9, E11.9). Tysinger, Alm GORMAN, PA-C  Active  carbamazepine  (TEGRETOL ) 200 MG tablet [id number] Yes Take 300 mg by mouth in the morning and at bedtime. [provider]  Active    carvedilol  (COREG ) 25 MG tablet [id number] Yes TAKE 1 TABLET(25 MG) BY MOUTH TWICE DAILY WITH A MEAL Tysinger, Alm RAMAN, PA-C  Active   HYDROcodone -acetaminophen  (NORCO/VICODIN) 5-325 MG tablet [id number] Yes Take 1 tablet by mouth every 6 (six) hours as needed for moderate pain (pain score 4-6). Tysinger, Alm RAMAN, PA-C  Active   losartan  (COZAAR ) 100 MG tablet [id number] Yes Take 100 mg by mouth at bedtime. [provider]  Active            Med Note SYDELL, HERON NOVAK   Fri Feb 23, 2024  2:14 PM) Patient states she is taking.   metFORMIN  (GLUCOPHAGE ) 500 MG tablet [id number] Yes TAKE 1 TABLET(500 MG) BY MOUTH DAILY WITH BREAKFAST Bulah Alm RAMAN, PA-C  Active     Discontinued 09/12/20 0126 (Change in therapy)   rosuvastatin  (CRESTOR ) 40 MG tablet [id number] Yes TAKE 1 TABLET(40 MG) BY MOUTH AT BEDTIME Tysinger, Alm RAMAN, PA-C  Active             Goals Addressed             This Visit's Progress    VBCI Transitions of Care (TOC) Care Plan       (/19/25 Reviewed and or Updated on 02/22/14 follow up call  TOC week 2).   Problems:  Recent Hospitalization for treatment of DMII, HTN, and Trigeminal Nerve Pain (Reason for hospitalization. Knowledge Deficit Related to Trigeminal Nerve Pain and No Hospital Follow Up Provider appointment HTN, DMII/A1c 6.5% Medication compliance issues not related to being able to afford medication. (New 02/23/24)  Goal:  Over the next 30 days, the patient will not experience hospital readmission  Interventions:  Transitions of Care: Annual Eye Exam education provided regarding need for exam Annual Foot Exam education provided regarding need for exam Durable Medical Equipment (DME) delivery confirmed Doctor Visits  - discussed the importance of doctor visits.  Trigeminal Nerve Pain: Reviewed patient's symptoms.  Pain 5-6/10 and it comes and goes.  Patient recently contacted PCP as pain level had increased to 8-9/10.  Started new medication and  patient reports that it has helped a lot and she feels much Better though pain is present, it is more manageable.  Trigeminal neuralgia, is a chronic nerve disorder that causes severe, paroxysmal (sudden and intense) pain in the face.  Patient's pain in on LEFT side of face.  Primary diagnosis on recent hospitalization.  Patient to consult neurologist November 2025 and is on wait list for a sooner appointment.  Symptoms reviewed:  Common Symptoms:  Severe, stabbing, or electric shock-like pain in the face, typically on one side Pain that may radiate to the jaw, teeth, or forehead Pain triggered by everyday activities such as brushing teeth, washing face, or talking Facial twitching or spasms Numbness or tingling in the affected area  Diabetes Interventions: Assessed patient's understanding of A1c goal: <6.5% Provided education to patient about basic DM disease process Start checking blood sugars several times a week even though you are currently not insulin, your A1C was labeled as HIGH.  Tracking your blood sugars helps you see trends in association with what you eating and activity.  Goal range per PCP is 80-130.  Lab Results  Component Value Date   HGBA1C 6.5 (H) 01/31/2024    Hypertension Interventions: Take all medications as prescribed per discussion with  PCP in most recent office visit.  Limit salt/sodium intake.  Patient states she is taking medications as prescribed this week.  Last practice recorded BP readings and patient reported on TOC calls.  02/16/24: 156/90 02/20/24: 126/80 BP Readings from Last 3 Encounters:  02/03/24 (!) 158/99  01/31/24 120/70  01/28/24 (!) 219/118   Most recent eGFR/CrCl:  Lab Results  Component Value Date   EGFR 85 01/31/2024    No components found for: CRCL  Evaluation of current treatment plan related to hypertension self management and patient's adherence to plan as established by provider Reviewed medications with patient and  discussed importance of compliance Discussed plans with patient for ongoing care management follow up and provided patient with direct contact information for care management team Advised patient, providing education and rationale, to monitor blood pressure daily and record, calling PCP for findings outside established parameters Discussed complications of poorly controlled blood pressure such as heart disease, stroke, circulatory complications, vision complications, kidney impairment, sexual dysfunction Screening for signs and symptoms of depression related to chronic disease state  Assessed social determinant of health barriers  Patient Self Care Activities:  Attend all scheduled provider appointments Attend church or other social activities Call pharmacy for medication refills 3-7 days in advance of running out of medications Call provider office for new concerns or questions  Notify RN Care Manager of TOC call rescheduling needs Participate in Transition of Care Program/Attend TOC scheduled calls Perform all self care activities independently  Take medications as prescribed   check blood pressure daily choose a place to take my blood pressure (home, clinic or office, retail store) write blood pressure results in a log or diary learn about high blood pressure keep a blood pressure log take blood pressure log to all doctor appointments call doctor for signs and symptoms of high blood pressure keep all doctor appointments take medications for blood pressure exactly as prescribed report new symptoms to your doctor Monitor blood sugars several times a week and keep a log to take to provider and to inform self of trends.  eat more whole grains, fruits and vegetables, lean meats and healthy fats per discussion  This will help with any constipation as well per discussion/review.   Plan:  Continue with plan outlined above and per primary care providers recommendations from last office  visit.  We did not get to review your blood pressure today, pain level other than the meds helped you a lot, continue taking and logging blood pressure, blood sugars, and take all medications as prescribed.  Telephone follow up appointment with care management team member scheduled for:  02/29/24 at 3 pm with Medford Balboa RN CM  Bing Edison MSN, RN RN Case Manager [facility]  VBCI-Population Health Office Hours M-F [phone] Direct Dial: [phone] Main Phone [phone]  Fax: [phone] [email or web address]          02/23/24: TOC RN CM follow up Week 2 was partially completed but call was cut short by patient. Patient was noted to have some continued compliance issues with blood pressure medications that are not financial in nature. PCP counseled on most recent visit and patient stated today she was taking medications as prescribed.    Recommendation:   Continue Current Plan of Care  Follow Up Plan:   Telephone follow-up in 1 week on 02/29/24 at 2 pm with Medford Balboa RN CM   Bing Edison MSN, RN RN Case Manager [place]  VBCI-Population Health Office Hours M-F [phone] Direct Dial: [phone]  Main Phone [phone]  Fax: [phone] [email or web address]

## 2024-02-23 NOTE — Patient Instructions (Signed)
 Visit Information  Thank you for taking time to visit with me today. Please don't hesitate to contact me if I can be of assistance to you before our next scheduled telephone appointment.  Our next appointment is by telephone on 02/29/24 at 2 pm  Following is a copy of your care plan:   Goals Addressed             This Visit's Progress    VBCI Transitions of Care (TOC) Care Plan       (/19/25 Reviewed and or Updated on 02/22/14 follow up call  TOC week 2).   Problems:  Recent Hospitalization for treatment of DMII, HTN, and Trigeminal Nerve Pain (Reason for hospitalization. Knowledge Deficit Related to Trigeminal Nerve Pain and No Hospital Follow Up Provider appointment HTN, DMII/A1c 6.5% Medication compliance issues not related to being able to afford medication. (New 02/23/24)  Goal:  Over the next 30 days, the patient will not experience hospital readmission  Interventions:  Transitions of Care: Annual Eye Exam education provided regarding need for exam Annual Foot Exam education provided regarding need for exam Durable Medical Equipment (DME) delivery confirmed Doctor Visits  - discussed the importance of doctor visits.  Trigeminal Nerve Pain: Reviewed patient's symptoms.  Pain 5-6/10 and it comes and goes.  Patient recently contacted PCP as pain level had increased to 8-9/10.  Started new medication and patient reports that it has helped a lot and she feels much Better though pain is present, it is more manageable.  Trigeminal neuralgia, is a chronic nerve disorder that causes severe, paroxysmal (sudden and intense) pain in the face.  Patient's pain in on LEFT side of face.  Primary diagnosis on recent hospitalization.  Patient to consult neurologist November 2025 and is on wait list for a sooner appointment.  Symptoms reviewed:  Common Symptoms:  Severe, stabbing, or electric shock-like pain in the face, typically on one side Pain that may radiate to the jaw, teeth, or  forehead Pain triggered by everyday activities such as brushing teeth, washing face, or talking Facial twitching or spasms Numbness or tingling in the affected area  Diabetes Interventions: Assessed patient's understanding of A1c goal: <6.5% Provided education to patient about basic DM disease process Start checking blood sugars several times a week even though you are currently not insulin, your A1C was labeled as HIGH.  Tracking your blood sugars helps you see trends in association with what you eating and activity.  Goal range per PCP is 80-130.  Lab Results  Component Value Date   HGBA1C 6.5 (H) 01/31/2024    Hypertension Interventions: Take all medications as prescribed per discussion with PCP in most recent office visit.  Limit salt/sodium intake.  Patient states she is taking medications as prescribed this week.  Last practice recorded BP readings and patient reported on TOC calls.  02/16/24: 156/90 02/20/24: 126/80 BP Readings from Last 3 Encounters:  02/03/24 (!) 158/99  01/31/24 120/70  01/28/24 (!) 219/118   Most recent eGFR/CrCl:  Lab Results  Component Value Date   EGFR 85 01/31/2024    No components found for: CRCL  Evaluation of current treatment plan related to hypertension self management and patient's adherence to plan as established by provider Reviewed medications with patient and discussed importance of compliance Discussed plans with patient for ongoing care management follow up and provided patient with direct contact information for care management team Advised patient, providing education and rationale, to monitor blood pressure daily and record, calling PCP for  findings outside established parameters Discussed complications of poorly controlled blood pressure such as heart disease, stroke, circulatory complications, vision complications, kidney impairment, sexual dysfunction Screening for signs and symptoms of depression related to chronic disease  state  Assessed social determinant of health barriers  Patient Self Care Activities:  Attend all scheduled provider appointments Attend church or other social activities Call pharmacy for medication refills 3-7 days in advance of running out of medications Call provider office for new concerns or questions  Notify RN Care Manager of TOC call rescheduling needs Participate in Transition of Care Program/Attend TOC scheduled calls Perform all self care activities independently  Take medications as prescribed   check blood pressure daily choose a place to take my blood pressure (home, clinic or office, retail store) write blood pressure results in a log or diary learn about high blood pressure keep a blood pressure log take blood pressure log to all doctor appointments call doctor for signs and symptoms of high blood pressure keep all doctor appointments take medications for blood pressure exactly as prescribed report new symptoms to your doctor Monitor blood sugars several times a week and keep a log to take to provider and to inform self of trends.  eat more whole grains, fruits and vegetables, lean meats and healthy fats per discussion  This will help with any constipation as well per discussion/review.   Plan:  Continue with plan outlined above and per primary care providers recommendations from last office visit.  We did not get to review your blood pressure today, pain level other than the meds helped you a lot, continue taking and logging blood pressure, blood sugars, and take all medications as prescribed.  Telephone follow up appointment with care management team member scheduled for:  02/29/24 at 3 pm with Medford Balboa RN CM  Bing Edison MSN, RN RN Case Manager [facility]  VBCI-Population Health Office Hours M-F [phone] Direct Dial: [phone] Main Phone [phone]  Fax: [phone] [email or web address]           Patient verbalizes understanding of  instructions and care plan provided today and agrees to view in MyChart. Active MyChart status and patient understanding of how to access instructions and care plan via MyChart confirmed with patient.     Telephone follow up appointment with care management team member scheduled for: The patient has been provided with contact information for the care management team and has been advised to call with any health related questions or concerns.   Please call the care guide team at [phone] if you need to cancel or reschedule your appointment.   Please call the Suicide and Crisis Lifeline: 988 call 1-800-273-TALK (toll free, 24 hour hotline) call 911 if you are experiencing a Mental Health or Behavioral Health Crisis or need someone to talk to.     Bing Edison MSN, RN RN Case Sales executive Health  VBCI-Population Health Office Hours M-F [phone] Direct Dial: [phone] Main Phone [phone]  Fax: [phone] [email or web address]

## 2024-02-26 ENCOUNTER — Telehealth: Payer: Self-pay | Admitting: Medical

## 2024-02-26 NOTE — Telephone Encounter (Signed)
 Pt dropped off fmla forms to be filled out. Was placed in Shane's folder for review.

## 2024-02-28 ENCOUNTER — Other Ambulatory Visit: Payer: Self-pay | Admitting: Medical

## 2024-02-28 MED ORDER — HYDROCODONE-ACETAMINOPHEN 5-325 MG PO TABS: 1.0000 | ORAL_TABLET | Freq: Four times a day (QID) | ORAL | 0 refills | Status: DC | PRN

## 2024-02-28 NOTE — Telephone Encounter (Signed)
 Copied from CRM [id number]. Topic: Clinical - Medication Refill >> Feb 28, 2024  8:15 AM Charlet HERO wrote: Medication: HYDROcodone -acetaminophen  (NORCO/VICODIN) 5-325 MG tablet  Has the patient contacted their pharmacy? No 0 refills  This is the patient's preferred pharmacy:  [id number] - [place], [place] - [address] AT [facility] [address] [place] [address] Phone: [phone] Fax: [phone]  Is this the correct pharmacy for this prescription? Yes If no, delete pharmacy and type the correct one.   Has the prescription been filled recently? Yes  Is the patient out of the medication? Yes  Has the patient been seen for an appointment in the last year OR does the patient have an upcoming appointment? Yes  Can we respond through MyChart? No  Agent: Please be advised that Rx refills may take up to 3 business days. We ask that you follow-up with your pharmacy.

## 2024-02-28 NOTE — Telephone Encounter (Signed)
 Just filled 02/20/24 should not be due

## 2024-03-01 ENCOUNTER — Other Ambulatory Visit: Payer: Self-pay

## 2024-03-01 NOTE — Transitions of Care (Post Inpatient/ED Visit) (Signed)
 Transition of Care week 3  Visit Note  03/01/2024  Name: Penny Rice MRN: [id number]          DOB: 01-26-1956  Situation: Patient enrolled in [facility] 30-day program. Visit completed with Olam Flaming by telephone.   Background:   Past Medical History:  Diagnosis Date   Allergy    Arthritis    Coronary artery calcification    Diabetes mellitus without complication (HCC)    Facial pain    Hyperlipidemia    Hypertension    Vitamin D  deficiency     Assessment: Patient Reported Symptoms: Cognitive Cognitive Status: Alert and oriented to person, place, and time, Struggling with memory recall      Neurological Neurological Review of Symptoms: Other: Oher Neurological Symptoms/Conditions [RPT]: Left Facial Trigeminal facial pain Neurological Management Strategies: Medication therapy, Coping strategies, Routine screening Neurological Comment: The patient states she is taking her narcotic pain medication four times a day  HEENT HEENT Symptoms Reported: No symptoms reported HEENT Comment: Facial pain due to trigeminal nerve pain    Cardiovascular Cardiovascular Symptoms Reported: No symptoms reported Does patient have uncontrolled Hypertension?: Yes Is patient checking Blood Pressure at home?: Yes Patient's Recent BP reading at home: 129/89 and 162/101 Cardiovascular Management Strategies: Medication therapy, Routine screening, Coping strategies Cardiovascular Comment: The patient has been taking her blood pressure medication and keeping a log. She has been taking her [place] PRN for BP greater than 120/70  Respiratory Respiratory Symptoms Reported: No symptoms reported    Endocrine Endocrine Symptoms Reported: No symptoms reported Is patient diabetic?: Yes Is patient checking blood sugars at home?: No List most recent blood sugar readings, include date and time of day: The patient has not been checking blood sugars. Encouraged the patient to take a couple times a week     Gastrointestinal Gastrointestinal Symptoms Reported: Constipation Additional Gastrointestinal Details: The patient states she takes a laxative and eats prunes Gastrointestinal Management Strategies: Activity, Medication therapy, Coping strategies    Genitourinary Genitourinary Symptoms Reported: No symptoms reported    Integumentary Integumentary Symptoms Reported: No symptoms reported    Musculoskeletal Musculoskelatal Symptoms Reviewed: No symptoms reported        Psychosocial Psychosocial Symptoms Reported: No symptoms reported         There were no vitals filed for this visit.  Medications Reviewed Today     Reviewed by Moises Reusing, RN (Case Manager) on 03/01/24 at 1425  Med List Status: <None>   Medication Order Taking? Sig Documenting Provider Last Dose Status Informant  amLODipine  (NORVASC ) 10 MG tablet [id number]  Take 1 tablet (10 mg total) by mouth daily. Bulah Alm GORMAN DEVONNA  Active   aspirin  EC 81 MG tablet [id number]  Take 1 tablet (81 mg total) by mouth daily. Bulah Alm GORMAN, PA-C  Active   baclofen  (LIORESAL ) 10 MG tablet [id number]  TAKE 1 TABLET(10 MG) BY MOUTH TWICE DAILY AS NEEDED FOR MUSCLE SPASMS Tysinger, Alm GORMAN, PA-C  Active   blood glucose meter kit and supplies [id number]  Dispense based on patient and insurance preference. Use up to twice a daily as directed. (FOR ICD-10 E10.9, E11.9). Tysinger, Alm GORMAN, PA-C  Active   carbamazepine  (TEGRETOL ) 200 MG tablet [id number]  Take 300 mg by mouth in the morning and at bedtime. [provider]  Active   carvedilol  (COREG ) 25 MG tablet [id number]  TAKE 1 TABLET(25 MG) BY MOUTH TWICE DAILY WITH A MEAL Tysinger, Alm GORMAN, PA-C  Active   HYDROcodone -acetaminophen  (NORCO/VICODIN) 5-325  MG tablet [id number]  Take 1 tablet by mouth every 6 (six) hours as needed for moderate pain (pain score 4-6). Tysinger, Alm RAMAN, PA-C  Active   losartan  (COZAAR ) 100 MG tablet [id number]  Take 100 mg by mouth at bedtime.  [provider]  Active            Med Note SYDELL, HERON NOVAK   Fri Feb 23, 2024  2:14 PM) Patient states she is taking.   metFORMIN  (GLUCOPHAGE ) 500 MG tablet [id number]  TAKE 1 TABLET(500 MG) BY MOUTH DAILY WITH BREAKFAST Tysinger, Alm RAMAN, PA-C  Active   olmesartan  (BENICAR ) 40 MG tablet [id number] Yes Take 40 mg by mouth daily as needed. Take if BP is greater than 120/70 [provider]  Active     Discontinued 09/12/20 0126 (Change in therapy)   rosuvastatin  (CRESTOR ) 40 MG tablet [id number]  TAKE 1 TABLET(40 MG) BY MOUTH AT BEDTIME Tysinger, Alm RAMAN, PA-C  Active             Recommendation:   Continue Current Plan of Care  Follow Up Plan:   Telephone follow-up in 1 week  Medford Balboa, BSN, RN [place]  VBCI - [facility] RN Care Manager [phone]

## 2024-03-01 NOTE — Patient Instructions (Signed)
 Visit Information  Thank you for taking time to visit with me today. Please don't hesitate to contact me if I can be of assistance to you before our next scheduled telephone appointment.  Our next appointment is by telephone on Wednesday October 1st at 3:00pm  Following is a copy of your care plan:   Goals Addressed             This Visit's Progress    VBCI Transitions of Care (TOC) Care Plan         Problems: (reviewed 03/01/24) Recent Hospitalization for treatment of DMII, HTN, and Trigeminal Nerve Pain (Reason for hospitalization. Knowledge Deficit Related to Trigeminal Nerve Pain and No Hospital Follow Up Provider appointment HTN, DMII/A1c 6.5% Medication compliance issues not related to being able to afford medication. (New 02/23/24)  Goal: (reviewed 03/01/24) Over the next 30 days, the patient will not experience hospital readmission  Interventions: (reviewed 03/01/24) Transitions of Care: Annual Eye Exam education provided regarding need for exam Annual Foot Exam education provided regarding need for exam Durable Medical Equipment (DME) delivery confirmed Doctor Visits  - discussed the importance of doctor visits.  Trigeminal Nerve Pain: Reviewed patient's symptoms. (reviewed 03/01/24) Pain 5-6/10 and it comes and goes.  Patient recently contacted PCP as pain level had increased to 8-9/10.  Started new medication and patient reports that it has helped a lot and she feels much Better though pain is present, it is more manageable.  Trigeminal neuralgia, is a chronic nerve disorder that causes severe, paroxysmal (sudden and intense) pain in the face.  Patient's pain in on LEFT side of face.  Primary diagnosis on recent hospitalization.  Patient to consult neurologist November 2025 and is on wait list for a sooner appointment.  Symptoms reviewed:  Common Symptoms:  Severe, stabbing, or electric shock-like pain in the face, typically on one side Pain that may radiate to the  jaw, teeth, or forehead Pain triggered by everyday activities such as brushing teeth, washing face, or talking Facial twitching or spasms Numbness or tingling in the affected area 03/01/24 - The patient is taking the pain medication every 6 hours and the refill only lasts for one week   Diabetes Interventions:(reviewed 03/01/24) Assessed patient's understanding of A1c goal: <6.5% Provided education to patient about basic DM disease process Start checking blood sugars several times a week even though you are currently not insulin, your A1C was labeled as HIGH.  Tracking your blood sugars helps you see trends in association with what you eating and activity.  Goal range per PCP is 80-130.  Lab Results  Component Value Date   HGBA1C 6.5 (H) 01/31/2024   03/01/24 - The patient is not checking her blood sugars. Encouraged the patient to start taking blood sugars   Hypertension Interventions:   (reviewed 03/01/24) Take all medications as prescribed per discussion with PCP in most recent office visit.  Limit salt/sodium intake.  Patient states she is taking medications as prescribed this week.  Last practice recorded BP readings and patient reported on TOC calls.  02/16/24: 156/90 02/20/24: 126/80 BP Readings from Last 3 Encounters:  02/03/24 (!) 158/99  01/31/24 120/70  01/28/24 (!) 219/118   Most recent eGFR/CrCl:  Lab Results  Component Value Date   EGFR 85 01/31/2024    No components found for: CRCL  Evaluation of current treatment plan related to hypertension self management and patient's adherence to plan as established by provider Reviewed medications with patient and discussed importance of compliance Discussed  plans with patient for ongoing care management follow up and provided patient with direct contact information for care management team Advised patient, providing education and rationale, to monitor blood pressure daily and record, calling PCP for findings outside  established parameters Discussed complications of poorly controlled blood pressure such as heart disease, stroke, circulatory complications, vision complications, kidney impairment, sexual dysfunction Screening for signs and symptoms of depression related to chronic disease state  Assessed social determinant of health barriers 03/01/24 - The patient reported blood pressure of 129/89 and 162/101. She took the PRN Olmesartan   Patient Self Care Activities: (reviewed 03/01/24) Attend all scheduled provider appointments Attend church or other social activities Call pharmacy for medication refills 3-7 days in advance of running out of medications Call provider office for new concerns or questions  Notify RN Care Manager of TOC call rescheduling needs Participate in Transition of Care Program/Attend TOC scheduled calls Perform all self care activities independently  Take medications as prescribed   check blood pressure daily choose a place to take my blood pressure (home, clinic or office, retail store) write blood pressure results in a log or diary learn about high blood pressure keep a blood pressure log take blood pressure log to all doctor appointments call doctor for signs and symptoms of high blood pressure keep all doctor appointments take medications for blood pressure exactly as prescribed report new symptoms to your doctor Monitor blood sugars several times a week and keep a log to take to provider and to inform self of trends.  eat more whole grains, fruits and vegetables, lean meats and healthy fats per discussion  This will help with any constipation as well per discussion/review.   Plan:  Continue with plan outlined above and per primary care providers recommendations from last office visit.  Reviewed pain level other than the meds helped you a lot, continue taking and logging blood pressure, blood sugars, and take all medications as prescribed.  Telephone follow up appointment  with care management team member scheduled for:  Wednesday March 06, 2024 at 3:00pm        Patient verbalizes understanding of instructions and care plan provided today and agrees to view in [place]. Active MyChart status and patient understanding of how to access instructions and care plan via MyChart confirmed with patient.     The patient has been provided with contact information for the care management team and has been advised to call with any health related questions or concerns.   Please call the care guide team at [phone] if you need to cancel or reschedule your appointment.   Please call the Suicide and Crisis Lifeline: 988 call the [place]  National Suicide Prevention Lifeline: (705)220-0324 or TTY: 260 199 1716 TTY 519-516-0606) to talk to a trained counselor if you are experiencing a Mental Health or Behavioral Health Crisis or need someone to talk to.  Medford Balboa, BSN, RN [place]  VBCI - [employer] [facility] RN Care Manager [phone]

## 2024-03-05 ENCOUNTER — Telehealth: Payer: Self-pay

## 2024-03-05 ENCOUNTER — Other Ambulatory Visit: Payer: Self-pay | Admitting: Medical

## 2024-03-05 MED ORDER — OXYCODONE HCL ER 10 MG PO T12A: 10.0000 mg | EXTENDED_RELEASE_TABLET | Freq: Two times a day (BID) | ORAL | 0 refills | Status: DC

## 2024-03-06 ENCOUNTER — Telehealth: Payer: Self-pay | Admitting: Internal Medicine

## 2024-03-06 ENCOUNTER — Other Ambulatory Visit: Payer: Self-pay

## 2024-03-06 NOTE — Transitions of Care (Post Inpatient/ED Visit) (Unsigned)
 Transition of Care week 4  Visit Note  03/06/2024  Name: Penny Rice MRN: [id number]          DOB: [birth year]  Situation: Patient enrolled in [facility] 30-day program. Visit completed with Olam Flaming by telephone.   Background:   Past Medical History:  Diagnosis Date   Allergy    Arthritis    Coronary artery calcification    Diabetes mellitus without complication (HCC)    Facial pain    Hyperlipidemia    Hypertension    Vitamin D  deficiency     Assessment: Patient Reported Symptoms: Cognitive Cognitive Status: Alert and oriented to person, place, and time, Struggling with memory recall      Neurological Neurological Review of Symptoms: Other: [place] Neurological Symptoms/Conditions [RPT]: Left Facial Trigeminal facial pain Neurological Management Strategies: Medication therapy, Coping strategies, Routine screening Neurological Comment: Narcotic pain medication has been changed to long acting Oxycontin  HEENT HEENT Symptoms Reported: No symptoms reported HEENT Comment: Facial pain due to trigeminal nerve pain    Cardiovascular Cardiovascular Symptoms Reported: No symptoms reported Does patient have uncontrolled Hypertension?: Yes Is patient checking Blood Pressure at home?: Yes Patient's Recent BP reading at home: 188/114, 135/89, 154/85 Cardiovascular Management Strategies: Medication therapy, Routine screening, Coping strategies Cardiovascular Comment: The patient has needed her Olmesartan  due to BP's over 120/70  Respiratory Respiratory Symptoms Reported: No symptoms reported    Endocrine Endocrine Symptoms Reported: No symptoms reported Is patient diabetic?: Yes Is patient checking blood sugars at home?: No Endocrine Comment: The patient needs a referall for Endocrinology  Gastrointestinal Gastrointestinal Symptoms Reported: No symptoms reported Gastrointestinal Management Strategies: Medication therapy, Activity, Coping strategies    Genitourinary Genitourinary Symptoms  Reported: No symptoms reported    Integumentary Integumentary Symptoms Reported: No symptoms reported    Musculoskeletal          Psychosocial Psychosocial Symptoms Reported: No symptoms reported         There were no vitals filed for this visit.  Medications Reviewed Today     Reviewed by Moises Reusing, RN (Case Manager) on 03/06/24 at 1521  Med List Status: <None>   Medication Order Taking? Sig Documenting Provider Last Dose Status Informant  amLODipine  (NORVASC ) 10 MG tablet [id number]  Take 1 tablet (10 mg total) by mouth daily. Bulah Alm RAMAN, PA-C  Active   aspirin  EC 81 MG tablet [id number]  Take 1 tablet (81 mg total) by mouth daily. Tysinger, Alm RAMAN, PA-C  Active   baclofen  (LIORESAL ) 10 MG tablet [id number]  TAKE 1 TABLET(10 MG) BY MOUTH TWICE DAILY AS NEEDED FOR MUSCLE SPASMS Tysinger, Alm RAMAN, PA-C  Active   blood glucose meter kit and supplies [id number]  Dispense based on patient and insurance preference. Use up to twice a daily as directed. (FOR ICD-10 E10.9, E11.9). Tysinger, Alm RAMAN, PA-C  Active   carbamazepine  (TEGRETOL ) 200 MG tablet [id number]  Take 300 mg by mouth in the morning and at bedtime. [provider]  Active   carvedilol  (COREG ) 25 MG tablet [id number]  TAKE 1 TABLET(25 MG) BY MOUTH TWICE DAILY WITH A MEAL Tysinger, Alm RAMAN, PA-C  Active   losartan  (COZAAR ) 100 MG tablet [id number]  Take 100 mg by mouth at bedtime. [provider]  Active            Med Note SYDELL, HERON NOVAK   Fri Feb 23, 2024  2:14 PM) Patient states she is taking.   metFORMIN  (GLUCOPHAGE ) 500 MG tablet [id number]  TAKE  1 TABLET(500 MG) BY MOUTH DAILY WITH BREAKFAST Tysinger, Alm RAMAN, PA-C  Active   olmesartan  (BENICAR ) 40 MG tablet [id number]  Take 40 mg by mouth daily as needed. Take if BP is greater than 120/70 [provider]  Active   oxyCODONE (OXYCONTIN) 10 mg 12 hr tablet [id number]  Take 1 tablet (10 mg total) by mouth every 12 (twelve) hours.  Bulah Alm RAMAN, PA-C  Active   Discontinued 09/12/20 0126 (Change in therapy)   rosuvastatin  (CRESTOR ) 40 MG tablet [id number]  TAKE 1 TABLET(40 MG) BY MOUTH AT BEDTIME Tysinger, Alm RAMAN, PA-C  Active             Recommendation:   PCP Follow-up Continue Current Plan of Care  Follow Up Plan:   Telephone follow-up in 1 week  Medford Balboa, BSN, RN [place]  VBCI - [facility] RN Care Manager [phone]

## 2024-03-06 NOTE — Patient Instructions (Signed)
 Visit Information  Thank you for taking time to visit with me today. Please don't hesitate to contact me if I can be of assistance to you before our next scheduled telephone appointment.  Our next appointment is by telephone on Wednesday October 8th at 3:00pm  Following is a copy of your care plan:   Goals Addressed             This Visit's Progress    VBCI Transitions of Care (TOC) Care Plan         Problems: (reviewed 03/06/24) Recent Hospitalization for treatment of DMII, HTN, and Trigeminal Nerve Pain (Reason for hospitalization. Knowledge Deficit Related to Trigeminal Nerve Pain and No Hospital Follow Up Provider appointment HTN, DMII/A1c 6.5% Medication compliance issues not related to being able to afford medication. (New 02/23/24)  Goal: (reviewed 03/06/24) Over the next 30 days, the patient will not experience hospital readmission  Interventions:  (reviewed 03/06/24) Transitions of Care: Annual Eye Exam education provided regarding need for exam Annual Foot Exam education provided regarding need for exam Durable Medical Equipment (DME) delivery confirmed Doctor Visits  - discussed the importance of doctor visits.  Trigeminal Nerve Pain: Reviewed patient's symptoms.  (reviewed 03/06/24) Pain 5-6/10 and it comes and goes.  Patient recently contacted PCP as pain level had increased to 8-9/10.  Started new medication and patient reports that it has helped a lot and she feels much Better though pain is present, it is more manageable.  Trigeminal neuralgia, is a chronic nerve disorder that causes severe, paroxysmal (sudden and intense) pain in the face.  Patient's pain in on LEFT side of face.  Primary diagnosis on recent hospitalization.  Patient to consult neurologist November 2025 and is on wait list for a sooner appointment.  Symptoms reviewed:  Common Symptoms:  Severe, stabbing, or electric shock-like pain in the face, typically on one side Pain that may radiate to  the jaw, teeth, or forehead Pain triggered by everyday activities such as brushing teeth, washing face, or talking Facial twitching or spasms Numbness or tingling in the affected area 03/01/24 - The patient is taking the pain medication every 6 hours and the refill only lasts for one week  03/06/24 - Patient's pain improved to a 5-6. PCP changed her pain medication to long acting Oxycontin 10mg  every 12 hours  Diabetes Interventions:(reviewed 03/01/24) Assessed patient's understanding of A1c goal: <6.5% Provided education to patient about basic DM disease process Start checking blood sugars several times a week even though you are currently not insulin, your A1C was labeled as HIGH.  Tracking your blood sugars helps you see trends in association with what you eating and activity.  Goal range per PCP is 80-130.  Lab Results  Component Value Date   HGBA1C 6.5 (H) 01/31/2024   03/01/24 - The patient is not checking her blood sugars. Encouraged the patient to start taking blood sugars 03/06/24 - The patient requires a referral to Endocrinology. The practice that she was referred to closed.   Hypertension Interventions:   (reviewed 03/06/24) Take all medications as prescribed per discussion with PCP in most recent office visit.  Limit salt/sodium intake.  Patient states she is taking medications as prescribed this week.  Last practice recorded BP readings and patient reported on TOC calls.  02/16/24: 156/90 02/20/24: 126/80 BP Readings from Last 3 Encounters:  02/03/24 (!) 158/99  01/31/24 120/70  01/28/24 (!) 219/118   Most recent eGFR/CrCl:  Lab Results  Component Value Date   EGFR  85 01/31/2024    No components found for: CRCL  Evaluation of current treatment plan related to hypertension self management and patient's adherence to plan as established by provider Reviewed medications with patient and discussed importance of compliance Discussed plans with patient for ongoing care  management follow up and provided patient with direct contact information for care management team Advised patient, providing education and rationale, to monitor blood pressure daily and record, calling PCP for findings outside established parameters Discussed complications of poorly controlled blood pressure such as heart disease, stroke, circulatory complications, vision complications, kidney impairment, sexual dysfunction Screening for signs and symptoms of depression related to chronic disease state  Assessed social determinant of health barriers 03/01/24 - The patient reported blood pressure of 129/89 and 162/101. She took the PRN Olmesartan  03/06/24 - BP reported are 188/114, 135/89, 154/85. Reported to the PCP  Patient Self Care Activities:  (reviewed 03/06/24) Attend all scheduled provider appointments Attend church or other social activities Call pharmacy for medication refills 3-7 days in advance of running out of medications Call provider office for new concerns or questions  Notify RN Care Manager of TOC call rescheduling needs Participate in Transition of Care Program/Attend TOC scheduled calls Perform all self care activities independently  Take medications as prescribed   check blood pressure daily choose a place to take my blood pressure (home, clinic or office, retail store) write blood pressure results in a log or diary learn about high blood pressure keep a blood pressure log take blood pressure log to all doctor appointments call doctor for signs and symptoms of high blood pressure keep all doctor appointments take medications for blood pressure exactly as prescribed report new symptoms to your doctor Monitor blood sugars several times a week and keep a log to take to provider and to inform self of trends.  eat more whole grains, fruits and vegetables, lean meats and healthy fats per discussion  This will help with any constipation as well per discussion/review.    Plan:  Continue with plan outlined above and per primary care providers recommendations from last office visit.  Reviewed pain level other than the meds helped you a lot, continue taking and logging blood pressure, blood sugars, and take all medications as prescribed.  Telephone follow up appointment with care management team member scheduled for:  Wednesday October 8th, 2025 at 3:00pm        Patient verbalizes understanding of instructions and care plan provided today and agrees to view in [place]. Active MyChart status and patient understanding of how to access instructions and care plan via MyChart confirmed with patient.     The patient has been provided with contact information for the care management team and has been advised to call with any health related questions or concerns.   Please call the care guide team at [phone] if you need to cancel or reschedule your appointment.   Please call the Suicide and Crisis Lifeline: 988 call the [place]  National Suicide Prevention Lifeline: 509-844-4520 or TTY: 717-275-4482 TTY 2318312151) to talk to a trained counselor if you are experiencing a Mental Health or Behavioral Health Crisis or need someone to talk to.  Medford Balboa, BSN, RN [place]  VBCI - [employer] [facility] RN Care Manager [phone]

## 2024-03-06 NOTE — Telephone Encounter (Signed)
-----   Message from Ludie Gent sent at 03/05/2024  4:53 PM EDT ----- Samule  It is frustrating she cannot get back into the neurology center then November.  I still do not have the most recent updated nfo on the date of her endocrinology consult  Instead of doing the hydrocodone  every 6 hours lets try a little different approach.  Lets try OxyContin long-acting medicine which is a stronger pain pill but just twice a day  This may do better overall and require less pills  Make sure you take all of your pill bottles when you go see your specialist and on your next visit here  Lets go ahead and plan a 2 to 3-week follow-up here as well regarding this medication change ----- Message ----- From: Moises Reusing, RN Sent: 03/01/2024   2:58 PM EDT To: Alm GORMAN Gent, PA-C  TOC Outreach completed today. The patient is taking the Hydrocodone -acetaminophen  5-325mg  every 6 hours. She states she needs this for the pain. Taking this medication like this the patient will run out of her 30 Day supply in a week. She had her refill on 02/28/24 and she will require another refill by October 1st. She is concerned about running out. She has been compliant with taking her BP and her last two readings were 129/89 and 162/101. She took the PRN Olmesartan  40mg . Please advise.  Medford Moises, BSN, RN [place]  VBCI - [employer] [facility] RN Care Manager [phone]

## 2024-03-07 ENCOUNTER — Other Ambulatory Visit: Payer: Self-pay | Admitting: Medical

## 2024-03-07 MED ORDER — HYDROCODONE-ACETAMINOPHEN 5-325 MG PO TABS: 1.0000 | ORAL_TABLET | Freq: Three times a day (TID) | ORAL | 0 refills | Status: DC

## 2024-03-07 NOTE — Telephone Encounter (Signed)
 Pt was notified and will contact Dr. Tommas office to schedule a follow-up with them. She is already established there and was sent back in [place].  Pt was advised to take BP medication everyday

## 2024-03-07 NOTE — Telephone Encounter (Signed)
-----   Message from Ludie Gent sent at 03/06/2024  4:57 PM EDT ----- Check on status of endocrinology referral  Make sure she is taking her blood pressure medicine every day not as needed.  We can expect her blood pressures to be controlled if she is not taking her medication  Wanda -this patient has always had some issues with compliance with medicaitons, so be aware of that. ----- Message ----- From: Moises Wanda, RN Sent: 03/06/2024   4:17 PM EDT To: Alm GORMAN Gent, PA-C  This patient does not have an appointment for Endocrinology consult. The office that she was originally go to closed. She will need a new referral  She hasn't picked up her Oxycontin yet but will pick it up tonight. She has been instructed to stop the Hydocodone.  She has made a follow up appointment with you for two weeks. BP's are still high - 188/114, 135/89 and 154/85. She has been taking the PRN Olmesartan .  Medford Moises, BSN, RN [place]  VBCI - Population [facility] RN Care Manager [phone] ----- Message ----- From: Gent Alm GORMAN DEVONNA Sent: 03/05/2024   4:55 PM EDT To: Samule DELENA Louder, CMA; Wanda Moises, RN  Samule  It is frustrating she cannot get back into the neurology center then November.  I still do not have the most recent updated nfo on the date of her endocrinology consult  Instead of doing the hydrocodone  every 6 hours lets try a little different approach.  Lets try OxyContin long-acting medicine which is a stronger pain pill but just twice a day  This may do better overall and require less pills  Make sure you take all of your pill bottles when you go see your specialist and on your next visit here  Lets go ahead and plan a 2 to 3-week follow-up here as well regarding this medication change ----- Message ----- From: Moises Wanda, RN Sent: 03/01/2024   2:58 PM EDT To: Alm GORMAN Gent, PA-C  TOC Outreach completed today. The patient is taking the  Hydrocodone -acetaminophen  5-325mg  every 6 hours. She states she needs this for the pain. Taking this medication like this the patient will run out of her 30 Day supply in a week. She had her refill on 02/28/24 and she will require another refill by October 1st. She is concerned about running out. She has been compliant with taking her BP and her last two readings were 129/89 and 162/101. She took the PRN Olmesartan  40mg . Please advise.  Medford Moises, BSN, RN   VBCI - [employer] [facility] RN Care Manager [phone]

## 2024-03-07 NOTE — Transitions of Care (Post Inpatient/ED Visit) (Signed)
 03/07/2024  Patient ID: Penny Rice, female   DOB: 01-01-1956, 68 y.o.   MRN: [id number]    Returned the patient's call. She stated the new medication Oxycontin 10mg  every 12 hours cost her $164.00 and she cannot afford that. She did pick up this prescription but she states she keep paying that. She states she is meeting a deductible. The Hydrocodone -Acetaminophen  was more affordable for her. Notified the provider.  Medford Balboa, BSN, RN [place]  VBCI - [employer] [facility] RN Care Manager [phone]

## 2024-03-11 ENCOUNTER — Ambulatory Visit (INDEPENDENT_AMBULATORY_CARE_PROVIDER_SITE_OTHER): Admitting: Medical

## 2024-03-11 VITALS — BP 120/80 | HR 64 | Wt 165.0 lb

## 2024-03-11 DIAGNOSIS — K5900 Constipation, unspecified: Secondary | ICD-10-CM | POA: Diagnosis not present

## 2024-03-11 DIAGNOSIS — R112 Nausea with vomiting, unspecified: Secondary | ICD-10-CM | POA: Diagnosis not present

## 2024-03-11 DIAGNOSIS — E1169 Type 2 diabetes mellitus with other specified complication: Secondary | ICD-10-CM | POA: Diagnosis not present

## 2024-03-11 DIAGNOSIS — I10 Essential (primary) hypertension: Secondary | ICD-10-CM

## 2024-03-11 DIAGNOSIS — G500 Trigeminal neuralgia: Secondary | ICD-10-CM | POA: Insufficient documentation

## 2024-03-11 DIAGNOSIS — E785 Hyperlipidemia, unspecified: Secondary | ICD-10-CM

## 2024-03-11 DIAGNOSIS — E1165 Type 2 diabetes mellitus with hyperglycemia: Secondary | ICD-10-CM

## 2024-03-11 DIAGNOSIS — R519 Headache, unspecified: Secondary | ICD-10-CM | POA: Diagnosis not present

## 2024-03-11 DIAGNOSIS — R7989 Other specified abnormal findings of blood chemistry: Secondary | ICD-10-CM | POA: Diagnosis not present

## 2024-03-11 MED ORDER — ONDANSETRON 4 MG PO TBDP: 4.0000 mg | ORAL_TABLET | Freq: Three times a day (TID) | ORAL | 0 refills | Status: AC | PRN

## 2024-03-11 MED ORDER — OLMESARTAN MEDOXOMIL 40 MG PO TABS: 40.0000 mg | ORAL_TABLET | Freq: Every day | ORAL | 2 refills | Status: DC | PRN

## 2024-03-11 MED ORDER — POLYETHYLENE GLYCOL 3350 17 GM/SCOOP PO POWD: 17.0000 g | Freq: Two times a day (BID) | ORAL | 2 refills | Status: DC | PRN

## 2024-03-11 NOTE — Patient Instructions (Signed)
 Constipation  -use Miralax  1 cap full once or twice daily -drink at least 80-100 ounces of water daily -get 25 grams of fiber in the diet daily -use milk of magnesia for worse symptoms -this will likely improve once you discontinue the pain medication, OxyContin  Nausea  -use Zofran/ondansetron as needed up to every 6 hours  Facial pain, severe headaches, trigeminal neuralgia -continue Carbamazepine  300mg  twice daily -you reported that you are no longer taking baclofen  -improved on Oxycontin 10mg  twice daily as Carbamezapine and baclofen  by themselves were not helping enough.   You were on hydrocodone  but this was requiring 4 times daily dosing -we can only use the Oxycontin short term -I would recommend try going to once daily Oxycontin in 2 weeks -follow up with neurology and endocrinology as planned  Elevated PTH and prolactin, unusual headaches - follow up with endocrinology tomorrow.  We called and were able to get you an appointment tomorrow!  Hypertension -continue carvedilol  25mg  twice daily -continue Amlodipine  10mg  daily -continue olmesartan  40mg  daily  Diabetes -continue Metformin  500mg  daily   History of noncompliance with medications

## 2024-03-11 NOTE — Progress Notes (Signed)
 Subjective: Chief Complaint  Patient presents with   Follow-up    Follow-up on BP Stomach has been hurting since starting pain medicine   Here for recheck.  Here for follow-up.  She has a history of diabetes, hyperlipidemia, hypertension, vitamin D  deficiency, CAD, trigeminal neuralgia, and noted to have elevated PTH and prolactin  She has had several recent visits to neurology and emergency dept for ongoing several facial pain and headaches despite her carbamazepine .   She also has baclofen  but that wasn't helping either .  So recently she was on hydrocodone  4 times daily with not much relief.  I recently sent OxyContin for better pain control and less pill burden while she awaits getting back in with her specialists .  She is much improved with pain but still has some facial pain on left.   Not currently using the baclofen , but is still using the carbamazepine .   Since staring OxyContin thought, having belly upset, stomach hurting, nausea, and some vomiting today.    Averaging about 1 BM per week currently despite good water intake  She doesn't seen neurology until 04/15/24.    She still hasn't gotten a follow up apt with endocrinology yet.    She has seen Dr. Becki office prior.   She is currently taking carvedilol  25mg  twice a day, olmesartan  40 mg, and amlodipine  10 mg for blood pressure management. Her home blood pressure readings have been higher than those recorded in the office.  She is taking metformin  once a day in the morning.     Past Medical History:  Diagnosis Date   Allergy    Arthritis    Coronary artery calcification    Diabetes mellitus without complication (HCC)    Facial pain    Hyperlipidemia    Hypertension    Vitamin D  deficiency    Current Outpatient Medications on File Prior to Visit  Medication Sig Dispense Refill   amLODipine  (NORVASC ) 10 MG tablet Take 1 tablet (10 mg total) by mouth daily. 90 tablet 1   aspirin  EC 81 MG tablet Take 1 tablet (81  mg total) by mouth daily. 90 tablet 3   carbamazepine  (TEGRETOL ) 200 MG tablet Take 300 mg by mouth in the morning and at bedtime.     carvedilol  (COREG ) 25 MG tablet TAKE 1 TABLET(25 MG) BY MOUTH TWICE DAILY WITH A MEAL 180 tablet 1   metFORMIN  (GLUCOPHAGE ) 500 MG tablet TAKE 1 TABLET(500 MG) BY MOUTH DAILY WITH BREAKFAST 90 tablet 1   oxyCODONE (OXYCONTIN) 10 mg 12 hr tablet Take 10 mg by mouth every 12 (twelve) hours.     rosuvastatin  (CRESTOR ) 40 MG tablet TAKE 1 TABLET(40 MG) BY MOUTH AT BEDTIME 90 tablet 1   blood glucose meter kit and supplies Dispense based on patient and insurance preference. Use up to twice a daily as directed. (FOR ICD-10 E10.9, E11.9). 1 each 0   [DISCONTINUED] potassium chloride  (KLOR-CON ) 10 MEQ tablet Take 1 tablet (10 mEq total) by mouth daily for 30 doses. 30 tablet 0   No current facility-administered medications on file prior to visit.     Objective: BP 120/80   Pulse 64   Wt 165 lb (74.8 kg)   BMI 28.32 kg/m   BP Readings from Last 3 Encounters:  03/11/24 120/80  02/20/24 126/80  02/16/24 (!) 156/90   Gen: wd, wn, nad Psych: pleasant, good eye contact, answers questions appropriately Neuro: CN2-12 intact, nonfocal exam Abdomen: +bs, soft, nontender, no mass, no organomegaly  Assessment: Encounter Diagnoses  Name Primary?   Constipation, unspecified constipation type Yes   Nausea and vomiting, unspecified vomiting type    Hypertension, unspecified type    Trigeminal neuralgia    Prolactin increased    Increased PTH level    Dyslipidemia associated with type 2 diabetes mellitus (HCC)    Type 2 diabetes mellitus with hyperglycemia, without long-term current use of insulin (HCC)    Severe headache    Facial pain      Plan: Constipation  -use Miralax  1 cap full once or twice daily -drink at least 80-100 ounces of water daily -get 25 grams of fiber in the diet daily -use milk of magnesia for worse symptoms -this will likely improve  once you discontinue the pain medication, OxyContin  Nausea  -use Zofran/ondansetron as needed up to every 6 hours  Facial pain, severe headaches, trigeminal neuralgia -continue Carbamazepine  300mg  twice daily -you reported that you are no longer taking baclofen  -improved on Oxycontin 10mg  twice daily as Carbamezapine and baclofen  by themselves were not helping enough.   You were on hydrocodone  but this was requiring 4 times daily dosing -we can only use the Oxycontin short term -I would recommend try going to once daily Oxycontin in 2 weeks -follow up with neurology and endocrinology as planned  Elevated PTH and prolactin, unusual headaches - follow up with endocrinology tomorrow.  We called and were able to get you an appointment tomorrow!  Hypertension -continue carvedilol  25mg  twice daily -continue Amlodipine  10mg  daily -continue olmesartan  40mg  daily  Diabetes -continue Metformin  500mg  daily   History of noncompliance with medications     Penny Rice was seen today for follow-up.  Diagnoses and all orders for this visit:  Constipation, unspecified constipation type  Nausea and vomiting, unspecified vomiting type  Hypertension, unspecified type  Trigeminal neuralgia  Prolactin increased  Increased PTH level  Dyslipidemia associated with type 2 diabetes mellitus (HCC)  Type 2 diabetes mellitus with hyperglycemia, without long-term current use of insulin (HCC)  Severe headache  Facial pain  Other orders -     ondansetron (ZOFRAN-ODT) 4 MG disintegrating tablet; Take 1 tablet (4 mg total) by mouth every 8 (eight) hours as needed for nausea or vomiting. -     olmesartan  (BENICAR ) 40 MG tablet; Take 1 tablet (40 mg total) by mouth daily as needed. Take if BP is greater than 120/70 -     polyethylene glycol powder (GLYCOLAX /MIRALAX ) 17 GM/SCOOP powder; Take 17 g by mouth 2 (two) times daily as needed. Dissolve 1 capful (17g) in 4-8 ounces of liquid and take by mouth  daily.     F/u with endocrinology and neurology soon

## 2024-03-12 ENCOUNTER — Other Ambulatory Visit: Payer: Self-pay | Admitting: Medical

## 2024-03-12 DIAGNOSIS — E049 Nontoxic goiter, unspecified: Secondary | ICD-10-CM | POA: Diagnosis not present

## 2024-03-12 DIAGNOSIS — E1165 Type 2 diabetes mellitus with hyperglycemia: Secondary | ICD-10-CM | POA: Diagnosis not present

## 2024-03-12 DIAGNOSIS — E213 Hyperparathyroidism, unspecified: Secondary | ICD-10-CM | POA: Diagnosis not present

## 2024-03-12 DIAGNOSIS — R7989 Other specified abnormal findings of blood chemistry: Secondary | ICD-10-CM | POA: Diagnosis not present

## 2024-03-12 DIAGNOSIS — M8580 Other specified disorders of bone density and structure, unspecified site: Secondary | ICD-10-CM | POA: Diagnosis not present

## 2024-03-12 DIAGNOSIS — E210 Primary hyperparathyroidism: Secondary | ICD-10-CM | POA: Diagnosis not present

## 2024-03-12 DIAGNOSIS — I10 Essential (primary) hypertension: Secondary | ICD-10-CM | POA: Diagnosis not present

## 2024-03-12 DIAGNOSIS — E7800 Pure hypercholesterolemia, unspecified: Secondary | ICD-10-CM | POA: Diagnosis not present

## 2024-03-12 DIAGNOSIS — E559 Vitamin D deficiency, unspecified: Secondary | ICD-10-CM | POA: Diagnosis not present

## 2024-03-12 MED ORDER — POLYETHYLENE GLYCOL 3350 17 GM/SCOOP PO POWD: ORAL | 2 refills | Status: AC

## 2024-03-13 ENCOUNTER — Other Ambulatory Visit: Payer: Self-pay

## 2024-03-13 NOTE — Transitions of Care (Post Inpatient/ED Visit) (Signed)
 Transition of Care Week 5  Visit Note  03/13/2024  Name: Penny Rice MRN: [id number]          DOB: 1955/12/25  Situation: Patient enrolled in [facility] 30-day program. Visit completed with Olam Flaming by telephone.   Background:   Past Medical History:  Diagnosis Date   Allergy    Arthritis    Coronary artery calcification    Diabetes mellitus without complication (HCC)    Facial pain    Hyperlipidemia    Hypertension    Vitamin D  deficiency     Assessment: Patient Reported Symptoms: Cognitive Cognitive Status: Alert and oriented to person, place, and time, Normal speech and language skills, Struggling with memory recall      Neurological Neurological Review of Symptoms: Other: Oher Neurological Symptoms/Conditions [RPT]: Left facial Trigeminal facial pain Neurological Management Strategies: Medication therapy, Coping strategies, Routine screening Neurological Comment: Narcotic pain medication. She states she needs this daily.  HEENT HEENT Symptoms Reported: Mouth or teeth pain HEENT Comment: Left side facial pain    Cardiovascular Cardiovascular Symptoms Reported: No symptoms reported Does patient have uncontrolled Hypertension?: Yes Is patient checking Blood Pressure at home?: Yes Patient's Recent BP reading at home: Did not discuss today. Normal BP at PCP office Cardiovascular Management Strategies: Medication therapy, Routine screening, Coping strategies  Respiratory Respiratory Symptoms Reported: No symptoms reported    Endocrine Endocrine Symptoms Reported: No symptoms reported Is patient diabetic?: Yes Is patient checking blood sugars at home?: No List most recent blood sugar readings, include date and time of day: Went to see Endocrinologist    Gastrointestinal Gastrointestinal Symptoms Reported: Constipation Additional Gastrointestinal Details: The patient states she is averaging one BM a week. Miralax  daily for BM. Do not go more than 3 days without BM       Genitourinary Genitourinary Symptoms Reported: No symptoms reported    Integumentary Integumentary Symptoms Reported: No symptoms reported    Musculoskeletal Musculoskelatal Symptoms Reviewed: No symptoms reported        Psychosocial Psychosocial Symptoms Reported: No symptoms reported         There were no vitals filed for this visit.  Medications Reviewed Today     Reviewed by Moises Reusing, RN (Case Manager) on 03/13/24 at 1526  Med List Status: <None>   Medication Order Taking? Sig Documenting Provider Last Dose Status Informant  amLODipine  (NORVASC ) 10 MG tablet [id number]  Take 1 tablet (10 mg total) by mouth daily. Bulah Alm GORMAN DEVONNA  Active   aspirin  EC 81 MG tablet [id number]  Take 1 tablet (81 mg total) by mouth daily. Tysinger, Alm GORMAN, PA-C  Active   blood glucose meter kit and supplies [id number]  Dispense based on patient and insurance preference. Use up to twice a daily as directed. (FOR ICD-10 E10.9, E11.9). Tysinger, Alm GORMAN, PA-C  Active   carbamazepine  (TEGRETOL ) 200 MG tablet [id number]  Take 300 mg by mouth in the morning and at bedtime. [provider]  Active   carvedilol  (COREG ) 25 MG tablet [id number]  TAKE 1 TABLET(25 MG) BY MOUTH TWICE DAILY WITH A MEAL Tysinger, Alm GORMAN, PA-C  Active   Ergocalciferol  (VITAMIN D2 PO) [id number] Yes Take 1,000 Units by mouth daily. [provider]  Active   metFORMIN  (GLUCOPHAGE ) 500 MG tablet [id number]  TAKE 1 TABLET(500 MG) BY MOUTH DAILY WITH BREAKFAST Tysinger, Alm GORMAN, PA-C  Active   olmesartan  (BENICAR ) 40 MG tablet [id number]  Take 1 tablet (40 mg total) by mouth daily as needed.  Take if BP is greater than 120/70 Tysinger, Alm RAMAN, PA-C  Active   ondansetron (ZOFRAN-ODT) 4 MG disintegrating tablet [id number]  Take 1 tablet (4 mg total) by mouth every 8 (eight) hours as needed for nausea or vomiting. Tysinger, Alm RAMAN, PA-C  Active   oxyCODONE (OXYCONTIN) 10 mg 12 hr tablet [id number]  Take 10 mg by  mouth every 12 (twelve) hours. [provider]  Active   polyethylene glycol powder (GLYCOLAX /MIRALAX ) 17 GM/SCOOP powder [id number]  Dissolve 1 capful (17g) in 4-8 ounces of liquid twice daily Bulah Alm RAMAN DEVONNA  Active     Discontinued 09/12/20 0126 (Change in therapy)   rosuvastatin  (CRESTOR ) 40 MG tablet [id number]  TAKE 1 TABLET(40 MG) BY MOUTH AT BEDTIME Tysinger, Alm RAMAN, PA-C  Active             Recommendation:   Continue Current Plan of Care  Follow Up Plan:   Telephone follow-up in 1 week  Medford Balboa, BSN, RN [place]  VBCI - [facility] RN Care Manager [phone]

## 2024-03-13 NOTE — Patient Instructions (Signed)
 Visit Information  Thank you for taking time to visit with me today. Please don't hesitate to contact me if I can be of assistance to you before our next scheduled telephone appointment.  Our next appointment is by telephone on Thursday October 16th at 3:00pm  Following is a copy of your care plan:   Goals Addressed             This Visit's Progress    VBCI Transitions of Care (TOC) Care Plan         Problems: (reviewed 03/13/24) Recent Hospitalization for treatment of DMII, HTN, and Trigeminal Nerve Pain (Reason for hospitalization. Knowledge Deficit Related to Trigeminal Nerve Pain and No Hospital Follow Up Provider appointment HTN, DMII/A1c 6.5% Medication compliance issues not related to being able to afford medication. (New 02/23/24)  Goal: (reviewed 03/13/24) Over the next 30 days, the patient will not experience hospital readmission  Interventions:  (reviewed 03/13/24) Transitions of Care: Annual Eye Exam education provided regarding need for exam Annual Foot Exam education provided regarding need for exam Durable Medical Equipment (DME) delivery confirmed Doctor Visits  - discussed the importance of doctor visits.  Trigeminal Nerve Pain: Reviewed patient's symptoms.  (reviewed 03/06/24) Pain 5-6/10 and it comes and goes.  Patient recently contacted PCP as pain level had increased to 8-9/10.  Started new medication and patient reports that it has helped a lot and she feels much Better though pain is present, it is more manageable.  Trigeminal neuralgia, is a chronic nerve disorder that causes severe, paroxysmal (sudden and intense) pain in the face.  Patient's pain in on LEFT side of face.  Primary diagnosis on recent hospitalization.  Patient to consult neurologist November 2025 and is on wait list for a sooner appointment.  Symptoms reviewed:  Common Symptoms:  Severe, stabbing, or electric shock-like pain in the face, typically on one side Pain that may radiate to  the jaw, teeth, or forehead Pain triggered by everyday activities such as brushing teeth, washing face, or talking Facial twitching or spasms Numbness or tingling in the affected area 03/01/24 - The patient is taking the pain medication every 6 hours and the refill only lasts for one week  03/06/24 - Patient's pain improved to a 5-6. PCP changed her pain medication to long acting Oxycontin 10mg  every 12 hours 03/13/24 - The patient is encouraged to take pain medication Oxycontin once a day as Oxycontin is not for long term use 03/13/24 - Take Miralax  17g daily for constipation. Do not go longer than 3 days without moving bowels.   Diabetes Interventions:(reviewed 03/01/24) Assessed patient's understanding of A1c goal: <6.5% Provided education to patient about basic DM disease process Start checking blood sugars several times a week even though you are currently not insulin, your A1C was labeled as HIGH.  Tracking your blood sugars helps you see trends in association with what you eating and activity.  Goal range per PCP is 80-130.  Lab Results  Component Value Date   HGBA1C 6.5 (H) 01/31/2024   03/01/24 - The patient is not checking her blood sugars. Encouraged the patient to start taking blood sugars 03/06/24 - The patient requires a referral to Endocrinology. The practice that she was referred to closed. - 10/7 the patient was seen   Hypertension Interventions:   (reviewed 03/06/24) Take all medications as prescribed per discussion with PCP in most recent office visit.  Limit salt/sodium intake.  Patient states she is taking medications as prescribed this week.  Last  practice recorded BP readings and patient reported on TOC calls.  02/16/24: 156/90 02/20/24: 126/80 BP Readings from Last 3 Encounters:  02/03/24 (!) 158/99  01/31/24 120/70  01/28/24 (!) 219/118   Most recent eGFR/CrCl:  Lab Results  Component Value Date   EGFR 85 01/31/2024    No components found for: CRCL  Evaluation  of current treatment plan related to hypertension self management and patient's adherence to plan as established by provider Reviewed medications with patient and discussed importance of compliance Discussed plans with patient for ongoing care management follow up and provided patient with direct contact information for care management team Advised patient, providing education and rationale, to monitor blood pressure daily and record, calling PCP for findings outside established parameters Discussed complications of poorly controlled blood pressure such as heart disease, stroke, circulatory complications, vision complications, kidney impairment, sexual dysfunction Screening for signs and symptoms of depression related to chronic disease state  Assessed social determinant of health barriers 03/01/24 - The patient reported blood pressure of 129/89 and 162/101. She took the PRN Olmesartan  03/06/24 - BP reported are 188/114, 135/89, 154/85. Reported to the PCP  Patient Self Care Activities:  (reviewed 03/06/24) Attend all scheduled provider appointments Attend church or other social activities Call pharmacy for medication refills 3-7 days in advance of running out of medications Call provider office for new concerns or questions  Notify RN Care Manager of TOC call rescheduling needs Participate in Transition of Care Program/Attend TOC scheduled calls Perform all self care activities independently  Take medications as prescribed   check blood pressure daily choose a place to take my blood pressure (home, clinic or office, retail store) write blood pressure results in a log or diary learn about high blood pressure keep a blood pressure log take blood pressure log to all doctor appointments call doctor for signs and symptoms of high blood pressure keep all doctor appointments take medications for blood pressure exactly as prescribed report new symptoms to your doctor Monitor blood sugars several  times a week and keep a log to take to provider and to inform self of trends.  eat more whole grains, fruits and vegetables, lean meats and healthy fats per discussion  This will help with any constipation as well per discussion/review.   Plan:  Continue with plan outlined above and per primary care providers recommendations from last office visit.  Reviewed pain level other than the meds helped you a lot, continue taking and logging blood pressure, blood sugars, and take all medications as prescribed.  Telephone follow up appointment with care management team member scheduled for:  Thursday October 16th at 3:00pm        Patient verbalizes understanding of instructions and care plan provided today and agrees to view in MyChart. Active MyChart status and patient understanding of how to access instructions and care plan via MyChart confirmed with patient.     The patient has been provided with contact information for the care management team and has been advised to call with any health related questions or concerns.   Please call the care guide team at [phone] if you need to cancel or reschedule your appointment.   Please call the Suicide and Crisis Lifeline: 988 call the [place]  National Suicide Prevention Lifeline: 510-126-6689 or TTY: 367 367 0423 TTY 412-500-4136) to talk to a trained counselor if you are experiencing a Mental Health or Behavioral Health Crisis or need someone to talk to.  Medford Balboa, BSN, RN [facility]  VBCI - Population  Health RN Care Manager [phone]

## 2024-03-21 ENCOUNTER — Other Ambulatory Visit: Payer: Self-pay

## 2024-03-21 NOTE — Transitions of Care (Post Inpatient/ED Visit) (Signed)
 Transition of Care week 6  Visit Note  03/21/2024  Name: Penny Rice MRN: [id number]          DOB: [birth year]  Situation: Patient enrolled in [facility] 30-day program. Visit completed with Olam Flaming by telephone.   Background:   Initial Transition Care Management Follow-up Telephone Call Discharge Date and Diagnosis: No data recorded   Past Medical History:  Diagnosis Date   Allergy    Arthritis    Coronary artery calcification    Diabetes mellitus without complication (HCC)    Facial pain    Hyperlipidemia    Hypertension    Vitamin D  deficiency     Assessment: Patient Reported Symptoms: Cognitive Cognitive Status: Alert and oriented to person, place, and time, Normal speech and language skills      Neurological Oher Neurological Symptoms/Conditions [RPT]: Still has some left facial pain but it is much improved Neurological Management Strategies: Medication therapy, Coping strategies, Routine screening Neurological Comment: She has backed off her pain meds  HEENT   HEENT Comment: left side face pain is much improved    Cardiovascular Cardiovascular Symptoms Reported: No symptoms reported Cardiovascular Management Strategies: Medication therapy, Routine screening, Coping strategies  Respiratory Respiratory Symptoms Reported: No symptoms reported    Endocrine Endocrine Symptoms Reported: No symptoms reported    Gastrointestinal Gastrointestinal Symptoms Reported: Constipation Additional Gastrointestinal Details: She states she is still having an issue with constipation but she does try to move her bowels every 3 days Gastrointestinal Management Strategies: Coping strategies, Medication therapy, Activity    Genitourinary Genitourinary Symptoms Reported: No symptoms reported    Integumentary Integumentary Symptoms Reported: No symptoms reported    Musculoskeletal Musculoskelatal Symptoms Reviewed: No symptoms reported Musculoskeletal Comment: The patient is back at work  three days a week      Psychosocial Psychosocial Symptoms Reported: No symptoms reported         There were no vitals filed for this visit.  Medications Reviewed Today     Reviewed by Moises Reusing, RN (Case Manager) on 03/21/24 at 1601  Med List Status: <None>   Medication Order Taking? Sig Documenting Provider Last Dose Status Informant  amLODipine  (NORVASC ) 10 MG tablet [id number]  Take 1 tablet (10 mg total) by mouth daily. Bulah Alm GORMAN DEVONNA  Active   aspirin  EC 81 MG tablet [id number]  Take 1 tablet (81 mg total) by mouth daily. Tysinger, Alm GORMAN, PA-C  Active   blood glucose meter kit and supplies [id number]  Dispense based on patient and insurance preference. Use up to twice a daily as directed. (FOR ICD-10 E10.9, E11.9). Tysinger, Alm GORMAN, PA-C  Active   carbamazepine  (TEGRETOL ) 200 MG tablet [id number]  Take 300 mg by mouth in the morning and at bedtime. [provider]  Active   carvedilol  (COREG ) 25 MG tablet [id number]  TAKE 1 TABLET(25 MG) BY MOUTH TWICE DAILY WITH A MEAL Tysinger, Alm GORMAN, PA-C  Active   Ergocalciferol  (VITAMIN D2 PO) [id number]  Take 1,000 Units by mouth daily. [provider]  Active   metFORMIN  (GLUCOPHAGE ) 500 MG tablet [id number]  TAKE 1 TABLET(500 MG) BY MOUTH DAILY WITH BREAKFAST Tysinger, Alm GORMAN, PA-C  Active   olmesartan  (BENICAR ) 40 MG tablet [id number]  Take 1 tablet (40 mg total) by mouth daily as needed. Take if BP is greater than 120/70 Tysinger, Alm GORMAN, PA-C  Active   ondansetron (ZOFRAN-ODT) 4 MG disintegrating tablet [id number]  Take 1 tablet (4 mg total) by mouth every 8 (eight)  hours as needed for nausea or vomiting. Tysinger, Alm RAMAN, PA-C  Active   oxyCODONE (OXYCONTIN) 10 mg 12 hr tablet [id number]  Take 10 mg by mouth every 12 (twelve) hours. [provider]  Active   polyethylene glycol powder (GLYCOLAX /MIRALAX ) 17 GM/SCOOP powder [id number]  Dissolve 1 capful (17g) in 4-8 ounces of liquid twice daily  Bulah Alm RAMAN DEVONNA  Active   Discontinued 09/12/20 0126 (Change in therapy)   rosuvastatin  (CRESTOR ) 40 MG tablet [id number]  TAKE 1 TABLET(40 MG) BY MOUTH AT BEDTIME Tysinger, Alm RAMAN, PA-C  Active             Recommendation:   Closure from the [facility] Program. Goals met  Follow Up Plan:   Closing From:  Transitions of Care Program  [facility], BSN, RN [place]  VBCI - [facility] RN Care Manager [phone]

## 2024-03-21 NOTE — Patient Instructions (Signed)
 Visit Information  Thank you for taking time to visit with me today. Please don't hesitate to contact me if I can be of assistance to you before our next scheduled telephone appointment.   Following is a copy of your care plan:   Goals Addressed             This Visit's Progress    COMPLETED: VBCI Transitions of Care (TOC) Care Plan         Problems: (reviewed 03/21/24) Recent Hospitalization for treatment of DMII, HTN, and Trigeminal Nerve Pain (Reason for hospitalization. Knowledge Deficit Related to Trigeminal Nerve Pain and No Hospital Follow Up Provider appointment HTN, DMII/A1c 6.5% Medication compliance issues not related to being able to afford medication. (New 02/23/24)  Goal: (reviewed 03/21/24) Over the next 30 days, the patient will not experience hospital readmission  Interventions:  (reviewed 03/21/24) Transitions of Care: Annual Eye Exam education provided regarding need for exam Annual Foot Exam education provided regarding need for exam Durable Medical Equipment (DME) delivery confirmed Doctor Visits  - discussed the importance of doctor visits.  Trigeminal Nerve Pain: Reviewed patient's symptoms.  (reviewed 03/21/24) Pain 5-6/10 and it comes and goes.  Patient recently contacted PCP as pain level had increased to 8-9/10.  Started new medication and patient reports that it has helped a lot and she feels much Better though pain is present, it is more manageable.  Trigeminal neuralgia, is a chronic nerve disorder that causes severe, paroxysmal (sudden and intense) pain in the face.  Patient's pain in on LEFT side of face.  Primary diagnosis on recent hospitalization.  Patient to consult neurologist November 2025 and is on wait list for a sooner appointment.  Symptoms reviewed:  Common Symptoms:  Severe, stabbing, or electric shock-like pain in the face, typically on one side Pain that may radiate to the jaw, teeth, or forehead Pain triggered by everyday  activities such as brushing teeth, washing face, or talking Facial twitching or spasms Numbness or tingling in the affected area 03/01/24 - The patient is taking the pain medication every 6 hours and the refill only lasts for one week  03/06/24 - Patient's pain improved to a 5-6. PCP changed her pain medication to long acting Oxycontin 10mg  every 12 hours 03/13/24 - The patient is encouraged to take pain medication Oxycontin once a day as Oxycontin is not for long term use 03/13/24 - Take Miralax  17g daily for constipation. Do not go longer than 3 days without moving bowels.   Diabetes Interventions:(reviewed 03/21/24) Assessed patient's understanding of A1c goal: <6.5% Provided education to patient about basic DM disease process Start checking blood sugars several times a week even though you are currently not insulin, your A1C was labeled as HIGH.  Tracking your blood sugars helps you see trends in association with what you eating and activity.  Goal range per PCP is 80-130.  Lab Results  Component Value Date   HGBA1C 6.5 (H) 01/31/2024   03/01/24 - The patient is not checking her blood sugars. Encouraged the patient to start taking blood sugars 03/06/24 - The patient requires a referral to Endocrinology. The practice that she was referred to closed. - 10/7 the patient was seen   Hypertension Interventions:   (reviewed 03/06/24) Take all medications as prescribed per discussion with PCP in most recent office visit.  Limit salt/sodium intake.  Patient states she is taking medications as prescribed this week.  Last practice recorded BP readings and patient reported on TOC calls.  02/16/24: 156/90 02/20/24: 126/80 BP Readings from Last 3 Encounters:  02/03/24 (!) 158/99  01/31/24 120/70  01/28/24 (!) 219/118   Most recent eGFR/CrCl:  Lab Results  Component Value Date   EGFR 85 01/31/2024    No components found for: CRCL  Evaluation of current treatment plan related to hypertension self  management and patient's adherence to plan as established by provider Reviewed medications with patient and discussed importance of compliance Discussed plans with patient for ongoing care management follow up and provided patient with direct contact information for care management team Advised patient, providing education and rationale, to monitor blood pressure daily and record, calling PCP for findings outside established parameters Discussed complications of poorly controlled blood pressure such as heart disease, stroke, circulatory complications, vision complications, kidney impairment, sexual dysfunction Screening for signs and symptoms of depression related to chronic disease state  Assessed social determinant of health barriers 03/01/24 - The patient reported blood pressure of 129/89 and 162/101. She took the PRN Olmesartan  03/06/24 - BP reported are 188/114, 135/89, 154/85. Reported to the PCP  Patient Self Care Activities:  (reviewed 03/21/24) Attend all scheduled provider appointments Attend church or other social activities Call pharmacy for medication refills 3-7 days in advance of running out of medications Call provider office for new concerns or questions  Notify RN Care Manager of TOC call rescheduling needs Participate in Transition of Care Program/Attend TOC scheduled calls Perform all self care activities independently  Take medications as prescribed   check blood pressure daily choose a place to take my blood pressure (home, clinic or office, retail store) write blood pressure results in a log or diary learn about high blood pressure keep a blood pressure log take blood pressure log to all doctor appointments call doctor for signs and symptoms of high blood pressure keep all doctor appointments take medications for blood pressure exactly as prescribed report new symptoms to your doctor Monitor blood sugars several times a week and keep a log to take to provider and to  inform self of trends.  eat more whole grains, fruits and vegetables, lean meats and healthy fats per discussion  This will help with any constipation as well per discussion/review.   Plan:  Continue with plan outlined above and per primary care providers recommendations from last office visit.  Reviewed pain level other than the meds helped you a lot, continue taking and logging blood pressure, blood sugars, and take all medications as prescribed.  Telephone follow up appointment with care management team member scheduled for:  Thursday October 16th at 3:00pm The patient is working three days a week. Goals are met.         Patient verbalizes understanding of instructions and care plan provided today and agrees to view in MyChart. Active MyChart status and patient understanding of how to access instructions and care plan via MyChart confirmed with patient.     The patient has been provided with contact information for the care management team and has been advised to call with any health related questions or concerns.   Please call the care guide team at [phone] if you need to cancel or reschedule your appointment.   Please call the Suicide and Crisis Lifeline: 988 call the [place]  National Suicide Prevention Lifeline: 816-781-4188 or TTY: 530-206-0682 TTY 915 187 4194) to talk to a trained counselor if you are experiencing a Mental Health or Behavioral Health Crisis or need someone to talk to. Medford Balboa, BSN, RN [employer] [facility]  VBCI - Population  Health RN Care Manager [phone]

## 2024-03-28 DIAGNOSIS — H2513 Age-related nuclear cataract, bilateral: Secondary | ICD-10-CM | POA: Diagnosis not present

## 2024-03-28 DIAGNOSIS — E119 Type 2 diabetes mellitus without complications: Secondary | ICD-10-CM | POA: Diagnosis not present

## 2024-03-28 DIAGNOSIS — H35033 Hypertensive retinopathy, bilateral: Secondary | ICD-10-CM | POA: Diagnosis not present

## 2024-03-28 DIAGNOSIS — H3562 Retinal hemorrhage, left eye: Secondary | ICD-10-CM | POA: Diagnosis not present

## 2024-04-15 ENCOUNTER — Encounter: Payer: Self-pay | Admitting: Diagnostic Neuroimaging

## 2024-04-15 ENCOUNTER — Ambulatory Visit: Admitting: Diagnostic Neuroimaging

## 2024-04-15 ENCOUNTER — Telehealth: Payer: Self-pay | Admitting: Diagnostic Neuroimaging

## 2024-04-15 VITALS — BP 175/93 | HR 60 | Ht 64.0 in | Wt 168.2 lb

## 2024-04-15 DIAGNOSIS — G500 Trigeminal neuralgia: Secondary | ICD-10-CM

## 2024-04-15 MED ORDER — CARBAMAZEPINE ER 400 MG PO TB12: 400.0000 mg | ORAL_TABLET | Freq: Two times a day (BID) | ORAL | 6 refills | Status: AC

## 2024-04-15 NOTE — Progress Notes (Signed)
 [facility]  PATIENT: Penny Rice DOB: [birth year]  REFERRING CLINICIAN: Bulah Alm RAMAN, PA-C HISTORY FROM: patient  REASON FOR VISIT: follow up   HISTORICAL  CHIEF COMPLAINT:  Chief Complaint  Patient presents with   RM 7     Patient is here with son and sister for Left-sided trigeminal neuralgia - constant pain on left side of her face. Has concerns about medication treatment currently on 3 different pain medications     HISTORY OF PRESENT ILLNESS:   UPDATE (04/15/24, VRP): Since last visit, was doing well until August/September 2025 when left trigeminal neuralgia symptoms recurred.  She had multiple ER visits and ultimately was admitted at [facility] for evaluation due to severe pain.  Nowadays pain is mainly around her left eye and slightly into her left maxillary region.  Also been using oxycodone twice a day but now is trying to wean off.  UPDATE (03/21/23, VRP): Since last visit, doing well, until June / July 2024, then some return of left facial pain twinges. Now on CBZ 100mg  daily - twice a day and doing well.   UPDATE (04/04/22, VRP): Since last visit, doing well. Pain is now resolved. Took CBZ for 2 months, then pain resolved. Now off CBZ. Doing well.   PRIOR HPI (09/21/21): 68 year old female here for evaluation of left mandibular pain.  Patient has had intermittent shocklike stabbing electrical shooting pain sensation lasting 3 to 5 seconds at a time, occurring every few months.  Symptoms started about 2 years ago.  She went to dentist and oral surgeon.  She had some extractions in the left upper molar region.  Unfortunately this did not help.  She tried some different pain medicines with mild relief.   REVIEW OF SYSTEMS: Full 14 system review of systems performed and negative with exception of: as per HPI.  ALLERGIES: No Known Allergies  HOME MEDICATIONS: Outpatient Medications Prior to Visit  Medication Sig Dispense Refill   amLODipine   (NORVASC ) 10 MG tablet Take 1 tablet (10 mg total) by mouth daily. 90 tablet 1   aspirin  EC 81 MG tablet Take 1 tablet (81 mg total) by mouth daily. 90 tablet 3   blood glucose meter kit and supplies Dispense based on patient and insurance preference. Use up to twice a daily as directed. (FOR ICD-10 E10.9, E11.9). 1 each 0   carbamazepine  (TEGRETOL ) 200 MG tablet Take 300 mg by mouth in the morning and at bedtime.     carvedilol  (COREG ) 25 MG tablet TAKE 1 TABLET(25 MG) BY MOUTH TWICE DAILY WITH A MEAL 180 tablet 1   Ergocalciferol  (VITAMIN D2 PO) Take 1,000 Units by mouth daily.     metFORMIN  (GLUCOPHAGE ) 500 MG tablet TAKE 1 TABLET(500 MG) BY MOUTH DAILY WITH BREAKFAST 90 tablet 1   olmesartan  (BENICAR ) 40 MG tablet Take 1 tablet (40 mg total) by mouth daily as needed. Take if BP is greater than 120/70 90 tablet 2   ondansetron (ZOFRAN-ODT) 4 MG disintegrating tablet Take 1 tablet (4 mg total) by mouth every 8 (eight) hours as needed for nausea or vomiting. 20 tablet 0   oxyCODONE (OXYCONTIN) 10 mg 12 hr tablet Take 10 mg by mouth every 12 (twelve) hours.     polyethylene glycol powder (GLYCOLAX /MIRALAX ) 17 GM/SCOOP powder Dissolve 1 capful (17g) in 4-8 ounces of liquid twice daily 3350 g 2   rosuvastatin  (CRESTOR ) 40 MG tablet TAKE 1 TABLET(40 MG) BY MOUTH AT BEDTIME 90 tablet 1   No facility-administered  medications prior to visit.    PAST MEDICAL HISTORY: Past Medical History:  Diagnosis Date   Allergy    Arthritis    Coronary artery calcification    Diabetes mellitus without complication (HCC)    Facial pain    Hyperlipidemia    Hypertension    Vitamin D  deficiency     PAST SURGICAL HISTORY: Past Surgical History:  Procedure Laterality Date   ABDOMINAL HYSTERECTOMY     pt reports full   COLONOSCOPY  2016   Dr. Gwendlyn Buddy   COLONOSCOPY  02/18/2020   POLYPECTOMY     TOOTH EXTRACTION  2022   2 teeth pulled   TOTAL ABDOMINAL HYSTERECTOMY     per pt    FAMILY  HISTORY: Family History  Problem Relation Age of Onset   Hypertension Mother    Other Father        violent death   Hypertension Sister    Diabetes Sister    Hypertension Sister    Hypertension Sister    Hypertension Sister    Hypertension Sister    Cancer Son        lung   Colon cancer Neg Hx    Heart disease Neg Hx    Stroke Neg Hx    Colon polyps Neg Hx    Rectal cancer Neg Hx    Stomach cancer Neg Hx     SOCIAL HISTORY: Social History   Socioeconomic History   Marital status: Married    Spouse name: Not on file   Number of children: 2   Years of education: Not on file   Highest education level: Not on file  Occupational History   Not on file  Tobacco Use   Smoking status: Never   Smokeless tobacco: Never  Vaping Use   Vaping status: Never Used  Substance and Sexual Activity   Alcohol use: No    Alcohol/week: 0.0 standard drinks of alcohol   Drug use: No   Sexual activity: Yes  Other Topics Concern   Not on file  Social History Narrative   Lives with husband, has 2 sons, no grandchildren.   Works at [employer]   01/2020   Social Drivers of Health   Financial Resource Strain: High Risk (02/19/2024)   Received from [facility]   Overall Financial Resource Strain (CARDIA)    Difficulty of Paying Living Expenses: Hard  Food Insecurity: No Food Insecurity (02/23/2024)   Hunger Vital Sign    Worried About Running Out of Food in the Last Year: Never true    Ran Out of Food in the Last Year: Never true  Transportation Needs: No Transportation Needs (02/23/2024)   PRAPARE - Administrator, Civil Service (Medical): No    Lack of Transportation (Non-Medical): No  Physical Activity: Not on file  Stress: Not on file  Social Connections: Unknown (02/16/2024)   Social Connection and Isolation Panel    Frequency of Communication with Friends and Family: Three times a week    Frequency of Social Gatherings with Friends and Family: Three  times a week    Attends Religious Services: More than 4 times per year    Active Member of Clubs or Organizations: No    Attends Banker Meetings: Never    Marital Status: Patient declined  Intimate Partner Violence: Not At Risk (02/23/2024)   Humiliation, Afraid, Rape, and Kick questionnaire    Fear of Current or Ex-Partner: No  Emotionally Abused: No    Physically Abused: No    Sexually Abused: No     PHYSICAL EXAM  GENERAL EXAM/CONSTITUTIONAL: Vitals:  Vitals:   04/15/24 1505  BP: (!) 175/93  Pulse: 60  SpO2: 97%  Weight: 168 lb 3.2 oz (76.3 kg)  Height: 5' 4 (1.626 m)   Body mass index is 28.87 kg/m. Wt Readings from Last 3 Encounters:  04/15/24 168 lb 3.2 oz (76.3 kg)  03/11/24 165 lb (74.8 kg)  02/20/24 164 lb 6.4 oz (74.6 kg)   Patient is in no distress; well developed, nourished and groomed; neck is supple  CARDIOVASCULAR: Examination of carotid arteries is normal; no carotid bruits Regular rate and rhythm, no murmurs Examination of peripheral vascular system by observation and palpation is normal  EYES: Ophthalmoscopic exam of optic discs and posterior segments is normal; no papilledema or hemorrhages No results found.  MUSCULOSKELETAL: Gait, strength, tone, movements noted in Neurologic exam below  NEUROLOGIC: MENTAL STATUS:      No data to display         awake, alert, oriented to person, place and time recent and remote memory intact normal attention and concentration language fluent, comprehension intact, naming intact fund of knowledge appropriate  CRANIAL NERVE:  2nd - no papilledema on fundoscopic exam 2nd, 3rd, 4th, 6th - pupils equal and reactive to light, visual fields full to confrontation, extraocular muscles intact, no nystagmus 5th - facial sensation symmetric 7th - facial strength symmetric 8th - hearing intact 9th - palate elevates symmetrically, uvula midline 11th - shoulder shrug symmetric 12th - tongue  protrusion midline  MOTOR:  normal bulk and tone, full strength in the BUE, BLE  SENSORY:  normal and symmetric to light touch, temperature, vibration  COORDINATION:  finger-nose-finger, fine finger movements normal  REFLEXES:  deep tendon reflexes present and symmetric  GAIT/STATION:  narrow based gait    DIAGNOSTIC DATA (LABS, IMAGING, TESTING) - I reviewed patient records, labs, notes, testing and imaging myself where available.  Lab Results  Component Value Date   WBC 7.7 02/03/2024   HGB 14.9 02/03/2024   HCT 44.7 02/03/2024   MCV 89.0 02/03/2024   PLT 228 02/03/2024      Component Value Date/Time   NA 138 02/03/2024 0845   NA 140 01/31/2024 1357   K 3.7 02/03/2024 0845   CL 101 02/03/2024 0845   CO2 23 02/03/2024 0845   GLUCOSE 108 (H) 02/03/2024 0845   BUN 14 02/03/2024 0845   BUN 12 01/31/2024 1357   CREATININE 0.97 02/03/2024 0845   CALCIUM  10.1 02/03/2024 0845   PROT 7.0 02/03/2024 0845   PROT 6.8 12/20/2022 1413   ALBUMIN 3.8 02/03/2024 0845   ALBUMIN 4.4 12/20/2022 1413   AST 20 02/03/2024 0845   ALT 16 02/03/2024 0845   ALKPHOS 78 02/03/2024 0845   BILITOT 0.6 02/03/2024 0845   BILITOT 0.4 12/20/2022 1413   GFRNONAA >60 02/03/2024 0845   GFRAA 88 07/23/2020 1559   Lab Results  Component Value Date   CHOL 165 12/20/2022   HDL 56 12/20/2022   LDLCALC 87 12/20/2022   LDLDIRECT 73 01/28/2021   TRIG 122 12/20/2022   CHOLHDL 2.9 12/20/2022   Lab Results  Component Value Date   HGBA1C 6.5 (H) 01/31/2024   No results found for: VITAMINB12 Lab Results  Component Value Date   TSH 0.253 (L) 01/31/2024    10/12/21 MRI brain (without) demonstrating: - Stable, moderate periventricular and subcortical foci of nonspecific T2  hyperintensities, likely chronic small vessel ischemic disease.  - No acute findings.  02/12/24 MRI brain 1.  No visualized etiology of trigeminal neuralgia.  2.  No acute intracranial abnormality.  3.  Severe chronic  microvascular ischemic changes.    ASSESSMENT AND PLAN  68 y.o. 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    Meds tried: carbamazepine   Dx:  1. Left-sided trigeminal neuralgia     PLAN:  LEFT TRIGEMINAL NEURALGIA (left V1, V2) - increase CBZ to 400mg  twice a day; try for next 2-4 weeks and report back via mychart - consider baclofen , gabapentin  in future - wean off oxycodone  HYPERTENSION - on BP meds; recheck at home and follow up with PCP and cardiology  Meds ordered this encounter  Medications   carbamazepine  (TEGRETOL  XR) 400 MG 12 hr tablet    Sig: Take 1 tablet (400 mg total) by mouth 2 (two) times daily.    Dispense:  60 tablet    Refill:  6   Return in about 3 months (around 07/16/2024).    Penny FABIENE HANLON, MD 04/15/2024, 4:25 PM Certified in Neurology, Neurophysiology and Neuroimaging  [facility] [address] [place], [place] [address] [phone]

## 2024-04-15 NOTE — Telephone Encounter (Signed)
 Appointment details confirmed

## 2024-04-15 NOTE — Patient Instructions (Signed)
 LEFT TRIGEMINAL NEURALGIA (left V1, V2) - increase CBZ to 400mg  twice a day; try for next 2-4 weeks and report back via mychart - consider baclofen , gabapentin  in future - wean off oxycodone

## 2024-04-29 ENCOUNTER — Other Ambulatory Visit: Payer: Self-pay | Admitting: Medical

## 2024-04-29 DIAGNOSIS — Z1231 Encounter for screening mammogram for malignant neoplasm of breast: Secondary | ICD-10-CM

## 2024-05-28 ENCOUNTER — Ambulatory Visit
Admission: RE | Admit: 2024-05-28 | Discharge: 2024-05-28 | Disposition: A | Discharge status: Home / Self Care | Source: Ambulatory Visit | Attending: Medical | Admitting: Medical

## 2024-05-28 DIAGNOSIS — Z1231 Encounter for screening mammogram for malignant neoplasm of breast: Secondary | ICD-10-CM

## 2024-06-07 ENCOUNTER — Other Ambulatory Visit: Payer: Self-pay | Admitting: Medical

## 2024-06-07 DIAGNOSIS — R928 Other abnormal and inconclusive findings on diagnostic imaging of breast: Secondary | ICD-10-CM

## 2024-06-13 ENCOUNTER — Ambulatory Visit
Admission: RE | Admit: 2024-06-13 | Discharge: 2024-06-13 | Disposition: A | Discharge status: Home / Self Care | Source: Ambulatory Visit | Attending: Medical | Admitting: Medical

## 2024-06-13 ENCOUNTER — Ambulatory Visit: Payer: Self-pay | Admitting: Medical

## 2024-06-13 ENCOUNTER — Other Ambulatory Visit: Payer: Self-pay | Admitting: Medical

## 2024-06-13 DIAGNOSIS — N6310 Unspecified lump in the right breast, unspecified quadrant: Secondary | ICD-10-CM

## 2024-06-13 DIAGNOSIS — R928 Other abnormal and inconclusive findings on diagnostic imaging of breast: Secondary | ICD-10-CM

## 2024-06-13 NOTE — Progress Notes (Signed)
 Ultrasound was abnormal and biopsy was recommended.  She should be contacted soon about biopsy

## 2024-06-13 NOTE — Progress Notes (Signed)
 Recent mammogram abnormal.  It looks like she is set up for today for additional imaging

## 2024-06-17 ENCOUNTER — Ambulatory Visit
Admission: RE | Admit: 2024-06-17 | Discharge: 2024-06-17 | Disposition: A | Source: Ambulatory Visit | Attending: Medical | Admitting: Medical

## 2024-06-17 ENCOUNTER — Inpatient Hospital Stay: Admission: RE | Admit: 2024-06-17 | Discharge: 2024-06-17 | Attending: Medical | Admitting: Medical

## 2024-06-17 ENCOUNTER — Other Ambulatory Visit: Payer: Self-pay | Admitting: Medical

## 2024-06-17 DIAGNOSIS — N6310 Unspecified lump in the right breast, unspecified quadrant: Secondary | ICD-10-CM

## 2024-06-17 DIAGNOSIS — R928 Other abnormal and inconclusive findings on diagnostic imaging of breast: Secondary | ICD-10-CM

## 2024-06-19 ENCOUNTER — Ambulatory Visit: Payer: Self-pay

## 2024-06-19 ENCOUNTER — Ambulatory Visit: Admitting: Medical

## 2024-06-19 VITALS — BP 120/76 | HR 66 | Temp 97.6°F

## 2024-06-19 DIAGNOSIS — R7989 Other specified abnormal findings of blood chemistry: Secondary | ICD-10-CM | POA: Diagnosis not present

## 2024-06-19 DIAGNOSIS — E559 Vitamin D deficiency, unspecified: Secondary | ICD-10-CM

## 2024-06-19 DIAGNOSIS — I7090 Unspecified atherosclerosis: Secondary | ICD-10-CM

## 2024-06-19 DIAGNOSIS — Z7189 Other specified counseling: Secondary | ICD-10-CM | POA: Diagnosis not present

## 2024-06-19 DIAGNOSIS — E782 Mixed hyperlipidemia: Secondary | ICD-10-CM | POA: Diagnosis not present

## 2024-06-19 DIAGNOSIS — E785 Hyperlipidemia, unspecified: Secondary | ICD-10-CM

## 2024-06-19 DIAGNOSIS — J301 Allergic rhinitis due to pollen: Secondary | ICD-10-CM

## 2024-06-19 DIAGNOSIS — E1169 Type 2 diabetes mellitus with other specified complication: Secondary | ICD-10-CM | POA: Diagnosis not present

## 2024-06-19 DIAGNOSIS — I2510 Atherosclerotic heart disease of native coronary artery without angina pectoris: Secondary | ICD-10-CM | POA: Diagnosis not present

## 2024-06-19 DIAGNOSIS — G500 Trigeminal neuralgia: Secondary | ICD-10-CM

## 2024-06-19 DIAGNOSIS — E1165 Type 2 diabetes mellitus with hyperglycemia: Secondary | ICD-10-CM | POA: Diagnosis not present

## 2024-06-19 DIAGNOSIS — I10 Essential (primary) hypertension: Secondary | ICD-10-CM

## 2024-06-19 MED ORDER — ROSUVASTATIN CALCIUM 40 MG PO TABS: ORAL_TABLET | ORAL | 1 refills | Status: AC

## 2024-06-19 MED ORDER — OLMESARTAN MEDOXOMIL 40 MG PO TABS: 40.0000 mg | ORAL_TABLET | Freq: Every day | ORAL | 1 refills | Status: AC

## 2024-06-19 MED ORDER — AMLODIPINE BESYLATE 10 MG PO TABS: 10.0000 mg | ORAL_TABLET | Freq: Every day | ORAL | 1 refills | Status: AC

## 2024-06-19 MED ORDER — METFORMIN HCL 500 MG PO TABS: ORAL_TABLET | ORAL | 1 refills | Status: AC

## 2024-06-19 MED ORDER — CARVEDILOL 25 MG PO TABS: ORAL_TABLET | ORAL | 1 refills | Status: AC

## 2024-06-19 NOTE — Progress Notes (Signed)
 "  Name: Penny Rice   Date of Visit: 06/19/2024   Date of last visit with me: 03/11/2024   CHIEF COMPLAINT:  Chief Complaint  Patient presents with   Acute Visit    Side effects from medication- unable to provide the name of the medication that making her have side effects, sometimes will get off balance. Not sure if this is pain medication or bp causing this issues       HPI:  Discussed the use of AI scribe software for clinical note transcription with the patient, who gave verbal consent to proceed.  History of Present Illness   Penny Rice is a 69 year old female with hypertension who presents with concerns about blood pressure regulation.  She has difficulty regulating her blood pressure despite medication. At home, her readings are often over 140/90 mmHg, although she achieved 120/80 mmHg at the clinic. She takes amlodipine  10 mg once daily, carvedilol  twice daily, and olmesartan  at night. She is concerned that her blood pressure should be lower given her medication regimen.  She started a new pain medication in November, prescribed by a neurologist, which she takes alongside her other medications. She takes seven pills in the morning and four at night.  She experiences occasional off-balance episodes while walking. Her last kidney function test four months ago included a potassium level that was slightly low, according to her recollection of prior labs. Her last CBC in August was reviewed, and her diabetes marker (A1c) was 6.5% four months ago.  She reports facial swelling upon waking, which others have noticed. She is not currently on any allergy medication.  She reports some memory concerns but denies severe incidents like leaving the stove on or forgetting destinations. She has not completed healthcare power of attorney or living will documentation.  No other aggravating or relieving factors. No other complaint.    Past Medical History:  Diagnosis Date   Allergy     Arthritis    Coronary artery calcification    Diabetes mellitus without complication (HCC)    Facial pain    Hyperlipidemia    Hypertension    Vitamin D  deficiency    Medications Ordered Prior to Encounter[1]    Objective: BP 120/76   Pulse 66   Temp 97.6 F (36.4 C)   SpO2 96%   BP Readings from Last 3 Encounters:  06/19/24 120/76  04/15/24 (!) 175/93  03/11/24 120/80   Wt Readings from Last 3 Encounters:  04/15/24 168 lb 3.2 oz (76.3 kg)  03/11/24 165 lb (74.8 kg)  02/20/24 164 lb 6.4 oz (74.6 kg)    Gen: wd, wn nad No ext edema Pulses WNL Psych: pleasant, good eye contact Neuro: nonfocal, but seems to forget some simple details with medications No obvious facial swelling    Assessment: Encounter Diagnoses  Name Primary?   Type 2 diabetes mellitus with hyperglycemia, without long-term current use of insulin (HCC) Yes   Dyslipidemia associated with type 2 diabetes mellitus (HCC)    Hypertension, unspecified type    Vitamin D  deficiency    Allergic rhinitis due to pollen, unspecified seasonality    Atherosclerosis    Trigeminal neuralgia    Elevated prolactin level    Advanced directives, counseling/discussion    Atherosclerosis of native coronary artery of native heart without angina pectoris    Mixed hyperlipidemia    Essential hypertension     MRI brain 02/12/24: IMPRESSION:  1. No visualized etiology of trigeminal neuralgia.  2.  No acute intracranial abnormality.  3.  Severe chronic microvascular ischemic changes.    MMSE 28/30    Plan: Hypertension Uncontrolled with home readings >140/90 mmHg. Current regimen includes amlodipine , carvedilol , and olmesartan . Risks include kidney damage, strokes, and memory issues. - Added magnesium supplement once daily.  She already has this at home. - Continue current antihypertensive regimen. - Referred to cardiology for follow-up. - Monitor blood pressure at home and record readings for cardiology  visit. - Limit salt intake and avoid fast food. - Encourage regular exercise.  Type 2 diabetes mellitus Last A1c 6.5% four months ago. Managed with metformin . - Continue metformin  as prescribed. - Check A1c at next visit.  Dyslipidemia Ongoing management with current cholesterol medication. - Continue current cholesterol medication.  Atherosclerosis Continued management with current medications necessary. - Continue current medications for atherosclerosis.  Magnesium deficiency Previous low magnesium levels. Supplementation may aid in blood pressure control and headache prevention. - Start magnesium supplement once daily.  Allergic rhinitis Reports morning facial swelling and occasional itchy eyes. Possible allergic rhinitis considered. - Consider trial of Zyrtec or Claritin for one month. - Consider eye drops if symptoms persist.  Memory loss Mild memory loss with occasional forgetfulness. Discussed importance of healthcare power of attorney and living will. - Discuss healthcare power of attorney and living will with family. - Consider family support for future cognitive decline.  Elevated prolactin  -follow up with endocrinology    Penny Rice was seen today for acute visit.  Diagnoses and all orders for this visit:  Type 2 diabetes mellitus with hyperglycemia, without long-term current use of insulin (HCC) -     Ambulatory referral to Cardiology -     rosuvastatin  (CRESTOR ) 40 MG tablet; TAKE 1 TABLET(40 MG) BY MOUTH AT BEDTIME  Dyslipidemia associated with type 2 diabetes mellitus (HCC) -     Ambulatory referral to Cardiology  Hypertension, unspecified type -     Ambulatory referral to Cardiology  Vitamin D  deficiency  Allergic rhinitis due to pollen, unspecified seasonality  Atherosclerosis  Trigeminal neuralgia  Elevated prolactin level  Advanced directives, counseling/discussion  Atherosclerosis of native coronary artery of native heart without angina  pectoris -     rosuvastatin  (CRESTOR ) 40 MG tablet; TAKE 1 TABLET(40 MG) BY MOUTH AT BEDTIME  Mixed hyperlipidemia -     rosuvastatin  (CRESTOR ) 40 MG tablet; TAKE 1 TABLET(40 MG) BY MOUTH AT BEDTIME  Essential hypertension -     carvedilol  (COREG ) 25 MG tablet; TAKE 1 TABLET(25 MG) BY MOUTH TWICE DAILY WITH A MEAL  Other orders -     amLODipine  (NORVASC ) 10 MG tablet; Take 1 tablet (10 mg total) by mouth daily. -     metFORMIN  (GLUCOPHAGE ) 500 MG tablet; TAKE 1 TABLET(500 MG) BY MOUTH DAILY WITH BREAKFAST -     olmesartan  (BENICAR ) 40 MG tablet; Take 1 tablet (40 mg total) by mouth at bedtime. Take extra tablet in morning if BP is greater than 120/70    F/u with cardiology soon, 5mo         [1]  Current Outpatient Medications on File Prior to Visit  Medication Sig Dispense Refill   aspirin  EC 81 MG tablet Take 1 tablet (81 mg total) by mouth daily. 90 tablet 3   carbamazepine  (TEGRETOL  XR) 400 MG 12 hr tablet Take 1 tablet (400 mg total) by mouth 2 (two) times daily. 60 tablet 6   Ergocalciferol  (VITAMIN D2 PO) Take 1,000 Units by mouth daily.     ondansetron  (  ZOFRAN -ODT) 4 MG disintegrating tablet Take 1 tablet (4 mg total) by mouth every 8 (eight) hours as needed for nausea or vomiting. 20 tablet 0   blood glucose meter kit and supplies Dispense based on patient and insurance preference. Use up to twice a daily as directed. (FOR ICD-10 E10.9, E11.9). 1 each 0   polyethylene glycol powder (GLYCOLAX /MIRALAX ) 17 GM/SCOOP powder Dissolve 1 capful (17g) in 4-8 ounces of liquid twice daily 3350 g 2   [DISCONTINUED] potassium chloride  (KLOR-CON ) 10 MEQ tablet Take 1 tablet (10 mEq total) by mouth daily for 30 doses. 30 tablet 0   No current facility-administered medications on file prior to visit.   "

## 2024-06-19 NOTE — Patient Instructions (Signed)
 Continue your current medicines   ADD the once daily Magnesium supplement you have at home  Follow up with neurology as planned  I will place new referral today to have follow up with heart doctor

## 2024-06-19 NOTE — Telephone Encounter (Signed)
 FYI Only or Action Required?: FYI only for provider: appointment scheduled on 06/19/24.  Patient was last seen in primary care on 03/11/2024 by Bulah Alm RAMAN, PA-C.  Called Nurse Triage reporting Medication Problem.   Triage Disposition: See PCP When Office is Open (Within 3 Days)  Patient/caregiver understands and will follow disposition?: Yes                            Copied from CRM #[id number]. Topic: Clinical - Red Word Triage >> Jun 19, 2024 10:37 AM Willma SAUNDERS wrote: Red Word that prompted transfer to Nurse Triage: Patient states her BP Has been higher than normal, last time she took it the bottom number was over 100 but can't remember when that was and has been having dizziness. Reason for Disposition  Prescription request for new medicine (not a refill)  Answer Assessment - Initial Assessment Questions 1. NAME of MEDICINE: What medicine(s) are you calling about?     Pain pill in the morning and pain pill at night and can't provide the name of the medication. You really don't know cause all of them are  mixed in there together  2. QUESTION: What is your question? (e.g., double dose of medicine, side effect)     Wants to talk about side effects and switching medications.  3. PRESCRIBER: Who prescribed the medicine? Reason: if prescribed by specialist, call should be referred to that group.     Either PCP [facility] or Neurologist  Dr Margaret.  4. SYMPTOMS: Do you have any symptoms? If Yes, ask: What symptoms are you having?  How bad are the symptoms (e.g., mild, moderate, severe)     Sometimes when taking her medication she feels dizzy. No SOB, chest pain, dizziness or hypertension at this time. She states she has not checked her BP and has no way to check. Patient states she was not calling in for hypertension with dizziness.  Protocols used: Medication Question Call-A-AH

## 2024-08-05 ENCOUNTER — Ambulatory Visit: Admitting: Diagnostic Neuroimaging
